# Patient Record
Sex: Female | Born: 1985 | Race: White | Hispanic: No | Marital: Single
Health system: Northeastern US, Academic
[De-identification: ages and names within clinical notes are randomized; demographics above are authoritative.]

## PROBLEM LIST (undated history)

## (undated) DIAGNOSIS — R3915 Urgency of urination: Secondary | ICD-10-CM

## (undated) DIAGNOSIS — R011 Cardiac murmur, unspecified: Secondary | ICD-10-CM

## (undated) DIAGNOSIS — N200 Calculus of kidney: Secondary | ICD-10-CM

## (undated) DIAGNOSIS — F32A Depression, unspecified: Secondary | ICD-10-CM

## (undated) DIAGNOSIS — F419 Anxiety disorder, unspecified: Secondary | ICD-10-CM

## (undated) DIAGNOSIS — D689 Coagulation defect, unspecified: Secondary | ICD-10-CM

## (undated) DIAGNOSIS — R350 Frequency of micturition: Secondary | ICD-10-CM

## (undated) DIAGNOSIS — K219 Gastro-esophageal reflux disease without esophagitis: Secondary | ICD-10-CM

## (undated) DIAGNOSIS — I10 Essential (primary) hypertension: Secondary | ICD-10-CM

## (undated) DIAGNOSIS — Z86718 Personal history of other venous thrombosis and embolism: Secondary | ICD-10-CM

## (undated) DIAGNOSIS — Z8719 Personal history of other diseases of the digestive system: Secondary | ICD-10-CM

## (undated) DIAGNOSIS — I639 Cerebral infarction, unspecified: Secondary | ICD-10-CM

## (undated) DIAGNOSIS — E282 Polycystic ovarian syndrome: Secondary | ICD-10-CM

## (undated) DIAGNOSIS — Z973 Presence of spectacles and contact lenses: Secondary | ICD-10-CM

## (undated) HISTORY — DX: Depression, unspecified: F32.A

## (undated) HISTORY — DX: Cardiac murmur, unspecified: R01.1

## (undated) HISTORY — DX: Personal history of other venous thrombosis and embolism: Z86.718

## (undated) HISTORY — DX: Essential (primary) hypertension: I10

## (undated) HISTORY — DX: Anxiety disorder, unspecified: F41.9

## (undated) HISTORY — DX: Cerebral infarction, unspecified: I63.9

## (undated) HISTORY — DX: Coagulation defect, unspecified: D68.9

---

## 2012-01-08 HISTORY — PX: TONSILLECTOMY: SUR1361

## 2013-01-07 HISTORY — PX: ESOPHAGOGASTRODUODENOSCOPY: SHX1529

## 2016-01-08 HISTORY — PX: WISDOM TOOTH EXTRACTION: SHX21

## 2018-03-12 ENCOUNTER — Emergency Department (HOSPITAL_COMMUNITY): Payer: Managed Care, Other (non HMO)

## 2018-03-12 ENCOUNTER — Emergency Department (HOSPITAL_COMMUNITY)
Admission: EM | Admit: 2018-03-12 | Discharge: 2018-03-12 | Disposition: A | Discharge status: Home / Self Care | Payer: Managed Care, Other (non HMO) | Attending: Emergency Medicine | Admitting: Emergency Medicine

## 2018-03-12 ENCOUNTER — Other Ambulatory Visit: Payer: Self-pay

## 2018-03-12 DIAGNOSIS — Z79899 Other long term (current) drug therapy: Secondary | ICD-10-CM | POA: Insufficient documentation

## 2018-03-12 DIAGNOSIS — R1031 Right lower quadrant pain: Secondary | ICD-10-CM

## 2018-03-12 LAB — CBC
HCT: 39.5 % (ref 36.0–46.0)
Hemoglobin: 13.3 g/dL (ref 12.0–15.0)
MCH: 28.1 pg (ref 26.0–34.0)
MCHC: 33.7 g/dL (ref 30.0–36.0)
MCV: 83.5 fL (ref 80.0–100.0)
Platelets: 255 10*3/uL (ref 150–400)
RBC: 4.73 MIL/uL (ref 3.87–5.11)
RDW: 13.5 % (ref 11.5–15.5)
WBC: 12.4 10*3/uL — AB (ref 4.0–10.5)
nRBC: 0 % (ref 0.0–0.2)

## 2018-03-12 LAB — COMPREHENSIVE METABOLIC PANEL
ALT: 23 U/L (ref 0–44)
AST: 20 U/L (ref 15–41)
Albumin: 4.2 g/dL (ref 3.5–5.0)
Alkaline Phosphatase: 87 U/L (ref 38–126)
Anion gap: 10 (ref 5–15)
BILIRUBIN TOTAL: 0.7 mg/dL (ref 0.3–1.2)
BUN: 14 mg/dL (ref 6–20)
CHLORIDE: 104 mmol/L (ref 98–111)
CO2: 24 mmol/L (ref 22–32)
Calcium: 9.3 mg/dL (ref 8.9–10.3)
Creatinine, Ser: 0.81 mg/dL (ref 0.44–1.00)
GFR calc Af Amer: >60 mL/min (ref >60)
Glucose, Bld: 100 mg/dL — ABNORMAL HIGH (ref 70–99)
POTASSIUM: 3.7 mmol/L (ref 3.5–5.1)
Sodium: 138 mmol/L (ref 135–145)
Total Protein: 7.2 g/dL (ref 6.5–8.1)

## 2018-03-12 LAB — URINALYSIS, ROUTINE W REFLEX MICROSCOPIC
Bilirubin Urine: NEGATIVE
Glucose, UA: NEGATIVE mg/dL
Ketones, ur: NEGATIVE mg/dL
Leukocytes,Ua: NEGATIVE
Nitrite: NEGATIVE
Protein, ur: 30 mg/dL — AB
RBC / HPF: >50 RBC/hpf — ABNORMAL HIGH (ref 0–5)
Specific Gravity, Urine: 1.032 — ABNORMAL HIGH (ref 1.005–1.030)
pH: 5 (ref 5.0–8.0)

## 2018-03-12 LAB — WET PREP, GENITAL
Clue Cells Wet Prep HPF POC: NONE SEEN
Sperm: NONE SEEN
TRICH WET PREP: NONE SEEN
YEAST WET PREP: NONE SEEN

## 2018-03-12 LAB — HCG, QUANTITATIVE, PREGNANCY: hCG, Beta Chain, Quant, S: <1 m[IU]/mL (ref <5)

## 2018-03-12 LAB — LIPASE, BLOOD: Lipase: 49 U/L (ref 11–51)

## 2018-03-12 LAB — I-STAT BETA HCG BLOOD, ED (MC, WL, AP ONLY): HCG, QUANTITATIVE: 10.7 m[IU]/mL — AB (ref <5)

## 2018-03-12 MED ORDER — HYDROCODONE-ACETAMINOPHEN 5-325 MG PO TABS
1.0000 | ORAL_TABLET | Freq: Once | ORAL | Status: AC
  Administered 2018-03-12: 1 via ORAL
  Filled 2018-03-12: qty 1

## 2018-03-12 MED ORDER — HYDROCODONE-ACETAMINOPHEN 5-325 MG PO TABS
2.0000 | ORAL_TABLET | Freq: Once | ORAL | Status: AC
  Administered 2018-03-12: 2 via ORAL
  Filled 2018-03-12: qty 2

## 2018-03-12 MED ORDER — SODIUM CHLORIDE 0.9% FLUSH: 3.0000 mL | Freq: Once | INTRAVENOUS | Status: DC

## 2018-03-12 MED ORDER — HYDROCODONE-ACETAMINOPHEN 5-325 MG PO TABS: 1.0000 | ORAL_TABLET | Freq: Four times a day (QID) | ORAL | 0 refills | Status: DC | PRN

## 2018-03-12 NOTE — ED Notes (Signed)
Patient verbalizes understanding of discharge instructions. Opportunity for questioning and answers were provided. Armband removed by staff, pt discharged from ED. Pt ambulatory to lobby and taken home by family. Prescriptions and follow up care reviewed.  

## 2018-03-12 NOTE — ED Triage Notes (Signed)
Main lab called and will add serum quant to light green tube  In lab

## 2018-03-12 NOTE — ED Notes (Signed)
Patient transported to Ultrasound 

## 2018-03-12 NOTE — ED Provider Notes (Signed)
Care assumed from  Tilden Fossa, MD at shift change with U/S pending.   In brief, this patient is a 33 y.o.  F with past medical history of PCOS, pancreatitis who presents for evaluation of abdominal pain.  She reports that for the last 2 to 3 months, he has had intermittent right lower quadrant abdominal pain.  She reports is worsened over the last week.  She states that she has had some nausea but denies any vomiting.  She has not had any fevers, vaginal discharge, urinary complaints.  See note from previous provider for full history/physical exam.  PLAN: Patient with reassuring lab work.  She does have a slight leukocytosis.  Her i-STAT beta was slightly elevated.  Patient had her P approxi-a week ago.  We will plan for serum quant.  He has pending ultrasound for evaluation of ovarian cyst.  Pelvic was unremarkable.  MDM:  Pelvic ultrasound reviewed.  No evidence of torsion.  No suspicious mass.  There is enlarged ovaries with increased number of follicles concerning for polycystic ovarian syndrome.  Patient with normal uterus.  Discussed results with patient.  She states that she has been feeling better.  She does have a little bit of pain still.  Her abdominal examination mild tenderness noted to the right side but otherwise no rigidity, guarding.  Exam otherwise unremarkable.  I discussed at length with patient regarding her symptoms and duration of symptoms as well as her work-up here in the ED.  Her lipase was unremarkable.  Additionally, do not suspect pancreatitis given her source and location of pain.  I did discuss with patient she has a slight leukocytosis.  Patient has not had any fever, nausea/vomiting and her pain has been ongoing for last 2-3 months.  My suspicion for appendicitis is very low given history/physical exam.  I discussed with patient regarding further evaluation of her pain.  Engage shared decision making regarding further evaluation by CT abdomen pelvis.  Patient feels  comfortable going home and managing her symptoms with her OB/GYN.  She she wishes to decline CT abdomen pelvis here in the ED today.  Given exam, duration of symptoms, I feel that this is reasonable.  Instructed patient to follow-up with her primary care doctor and OB/GYN.  Discussed with patient regarding strict return precautions. At this time, patient exhibits no emergent life-threatening condition that require further evaluation in ED or admission. Patient had ample opportunity for questions and discussion. All patient's questions were answered with full understanding. Strict return precautions discussed. Patient expresses understanding and agreement to plan.   Portions of this note were generated with Scientist, clinical (histocompatibility and immunogenetics). Dictation errors may occur despite best attempts at proofreading.     1. Right lower quadrant abdominal pain         Kristen Hoover 03/12/18 2325    Tilden Fossa, MD 03/13/18 1046

## 2018-03-12 NOTE — ED Provider Notes (Signed)
[facility] EMERGENCY DEPARTMENT Provider Note   CSN: [id number] Arrival date & time: 03/12/18  1654    History   Chief Complaint Chief Complaint  Patient presents with  . Abdominal Pain    HPI Kristen Hoover is a 33 y.o. female.     The history is provided by the patient. No language interpreter was used.  Abdominal Pain   Kristen Hoover is a 33 y.o. female who presents to the Emergency Department complaining of abdominal pain. He department accompanied by her husband for evaluation of right lower quadrant pain. Pain has been present for the last 2 to 3 months. Initially it was mild and intermittent in nature. Over the last two weeks her pain has significantly intensified. Pain continues to wax and wane but is more severe when it returns. She denies any fevers, nausea, vomiting, dysuria, vaginal discharge, diarrhea, constipation. She does have a history of PC OS and has irregular menstrual cycles. She is sexually active with her husband, who has a history of vasectomy. She is not using any contraceptives. She also has a history of pancreatitis status post stent placement. No past medical history on file.  There are no active problems to display for this patient.      OB History   No obstetric history on file.      Home Medications    Prior to Admission medications   Medication Sig Start Date End Date Taking? Authorizing Provider  DM-Doxylamine-Acetaminophen (NYQUIL COLD & FLU PO) Take 15 mLs by mouth as needed (cough and cold).   Yes [provider]  FLUoxetine (PROZAC) 20 MG capsule Take 20 mg by mouth at bedtime. 02/20/18  Yes [provider]  metFORMIN (GLUCOPHAGE) 500 MG tablet Take 2 tablets by mouth 2 (two) times daily.   Yes [provider]  Naproxen Sodium 220 MG CAPS Take 2 tablets by mouth 2 (two) times daily as needed for pain.   Yes [provider]  Pseudoephedrine-APAP-DM (DAYQUIL PO) Take 2  capsules by mouth as needed (cough and cold).   Yes [provider]    Family History No family history on file.  Social History Social History   Tobacco Use  . Smoking status: Not on file  Substance Use Topics  . Alcohol use: Not on file  . Drug use: Not on file     Allergies   Patient has no allergy information on record.   Review of Systems Review of Systems  Gastrointestinal: Positive for abdominal pain.  All other systems reviewed and are negative.    Physical Exam Updated Vital Signs BP 109/74 (BP Location: Right Arm)   Pulse 88   Temp 98.2 F (36.8 C) (Oral)   Resp 18   SpO2 98%   Physical Exam Vitals signs and nursing note reviewed.  Constitutional:      Appearance: She is well-developed.  HENT:     Head: Normocephalic and atraumatic.  Cardiovascular:     Rate and Rhythm: Normal rate and regular rhythm.  Pulmonary:     Effort: Pulmonary effort is normal. No respiratory distress.  Abdominal:     Palpations: Abdomen is soft.     Tenderness: There is no guarding or rebound.     Comments: Mild right lower quadrant tenderness.  Genitourinary:    Comments: Scant vaginal bleeding.  No discharge.  No adnexal mass.  No CMT.   Musculoskeletal:        General: No tenderness.  Skin:  General: Skin is warm and dry.  Neurological:     Mental Status: She is alert and oriented to person, place, and time.  Psychiatric:        Behavior: Behavior normal.      ED Treatments / Results  Labs (all labs ordered are listed, but only abnormal results are displayed) Labs Reviewed  WET PREP, GENITAL - Abnormal; Notable for the following components:      Result Value   WBC, Wet Prep HPF POC FEW (*)    All other components within normal limits  COMPREHENSIVE METABOLIC PANEL - Abnormal; Notable for the following components:   Glucose, Bld 100 (*)    All other components within normal limits  CBC - Abnormal; Notable for the following components:   WBC  12.4 (*)    All other components within normal limits  URINALYSIS, ROUTINE W REFLEX MICROSCOPIC - Abnormal; Notable for the following components:   APPearance HAZY (*)    Specific Gravity, Urine 1.032 (*)    Hgb urine dipstick LARGE (*)    Protein, ur 30 (*)    RBC / HPF >50 (*)    Bacteria, UA RARE (*)    All other components within normal limits  I-STAT BETA HCG BLOOD, ED ([facility], [facility], [facility] ONLY) - Abnormal; Notable for the following components:   I-stat hCG, quantitative 10.7 (*)    All other components within normal limits  LIPASE, BLOOD  HCG, QUANTITATIVE, PREGNANCY  GC/CHLAMYDIA PROBE AMP ([place]) NOT AT [facility]    EKG None  Radiology No results found.  Procedures Procedures (including critical care time)  Medications Ordered in ED Medications  sodium chloride flush (NS) 0.9 % injection 3 mL (has no administration in time range)  HYDROcodone-acetaminophen (NORCO/VICODIN) 5-325 MG per tablet 1 tablet (1 tablet Oral Given 03/12/18 1846)     Initial Impression / Assessment and Plan / ED Course  I have reviewed the triage vital signs and the nursing notes.  Pertinent labs & imaging results that were available during my care of the patient were reviewed by me and considered in my medical decision making (see chart for details).        Patient with history of PCOS here for evaluation of progressive right lower quadrant pain. She has minimal tenderness on examination without peritoneal findings. Plan to obtain pelvic ultrasound to rule out ovarian cyst or torsion. Patient care transferred pending pelvic ultrasound.  Final Clinical Impressions(s) / ED Diagnoses   Final diagnoses:  None    ED Discharge Orders    None       Tilden Fossa, MD 03/12/18 [phone]

## 2018-03-12 NOTE — ED Triage Notes (Signed)
Pt here from UC with c/o rlq abd pain times 2 weeks , but has been ongoing for 2 months ,pt thinks she may have a cyst

## 2018-03-12 NOTE — Discharge Instructions (Signed)
You can take Tylenol or Ibuprofen as directed for pain. You can alternate Tylenol and Ibuprofen every 4 hours. If you take Tylenol at 1pm, then you can take Ibuprofen at 5pm. Then you can take Tylenol again at 9pm.   Take pain medications as directed for break through pain. Do not drive or operate machinery while taking this medication.   Follow-up with your OB/GYN and primary care doctor.  Return to emergency department for any worsening abdominal pain, fever, vomiting, vaginal bleeding or any other worsening or concerning symptoms.

## 2018-03-13 LAB — GC/CHLAMYDIA PROBE AMP (~~LOC~~) NOT AT ARMC
Chlamydia: NEGATIVE
Neisseria Gonorrhea: NEGATIVE

## 2018-03-20 ENCOUNTER — Ambulatory Visit
Admission: RE | Admit: 2018-03-20 | Discharge: 2018-03-20 | Disposition: A | Discharge status: Home / Self Care | Payer: Managed Care, Other (non HMO) | Source: Ambulatory Visit | Attending: Surgery | Admitting: Surgery

## 2018-03-20 ENCOUNTER — Other Ambulatory Visit: Payer: Self-pay | Admitting: Surgery

## 2018-03-20 DIAGNOSIS — R1031 Right lower quadrant pain: Secondary | ICD-10-CM

## 2018-03-20 MED ORDER — IOPAMIDOL (ISOVUE-300) INJECTION 61%
100.0000 mL | Freq: Once | INTRAVENOUS | Status: AC | PRN
  Administered 2018-03-20: 100 mL via INTRAVENOUS

## 2018-08-19 ENCOUNTER — Other Ambulatory Visit: Payer: Self-pay

## 2018-08-19 DIAGNOSIS — Z20822 Contact with and (suspected) exposure to covid-19: Secondary | ICD-10-CM

## 2018-08-20 LAB — NOVEL CORONAVIRUS, NAA: SARS-CoV-2, NAA: NOT DETECTED

## 2018-09-15 ENCOUNTER — Other Ambulatory Visit: Payer: Self-pay

## 2018-09-15 DIAGNOSIS — Z20822 Contact with and (suspected) exposure to covid-19: Secondary | ICD-10-CM

## 2018-09-16 LAB — NOVEL CORONAVIRUS, NAA: SARS-CoV-2, NAA: NOT DETECTED

## 2018-10-13 ENCOUNTER — Other Ambulatory Visit: Payer: Self-pay

## 2018-10-13 DIAGNOSIS — Z20822 Contact with and (suspected) exposure to covid-19: Secondary | ICD-10-CM

## 2018-10-15 LAB — NOVEL CORONAVIRUS, NAA: SARS-CoV-2, NAA: NOT DETECTED

## 2018-10-15 LAB — SPECIMEN STATUS REPORT

## 2018-11-21 ENCOUNTER — Other Ambulatory Visit: Payer: Self-pay | Admitting: *Deleted

## 2018-11-21 DIAGNOSIS — Z20828 Contact with and (suspected) exposure to other viral communicable diseases: Secondary | ICD-10-CM

## 2018-11-21 DIAGNOSIS — Z20822 Contact with and (suspected) exposure to covid-19: Secondary | ICD-10-CM

## 2018-11-24 LAB — NOVEL CORONAVIRUS, NAA: SARS-CoV-2, NAA: NOT DETECTED

## 2018-11-25 ENCOUNTER — Emergency Department (HOSPITAL_COMMUNITY): Payer: 59

## 2018-11-25 ENCOUNTER — Other Ambulatory Visit: Payer: Self-pay

## 2018-11-25 ENCOUNTER — Emergency Department (HOSPITAL_COMMUNITY)
Admission: EM | Admit: 2018-11-25 | Discharge: 2018-11-25 | Disposition: A | Discharge status: Home / Self Care | Payer: 59 | Attending: Emergency Medicine | Admitting: Emergency Medicine

## 2018-11-25 ENCOUNTER — Encounter (HOSPITAL_COMMUNITY): Payer: Self-pay

## 2018-11-25 DIAGNOSIS — Z79899 Other long term (current) drug therapy: Secondary | ICD-10-CM | POA: Diagnosis not present

## 2018-11-25 DIAGNOSIS — N200 Calculus of kidney: Secondary | ICD-10-CM | POA: Insufficient documentation

## 2018-11-25 DIAGNOSIS — E282 Polycystic ovarian syndrome: Secondary | ICD-10-CM | POA: Diagnosis not present

## 2018-11-25 DIAGNOSIS — R319 Hematuria, unspecified: Secondary | ICD-10-CM | POA: Diagnosis present

## 2018-11-25 DIAGNOSIS — Z7984 Long term (current) use of oral hypoglycemic drugs: Secondary | ICD-10-CM | POA: Diagnosis not present

## 2018-11-25 DIAGNOSIS — Z87891 Personal history of nicotine dependence: Secondary | ICD-10-CM | POA: Diagnosis not present

## 2018-11-25 HISTORY — DX: Polycystic ovarian syndrome: E28.2

## 2018-11-25 LAB — URINALYSIS, ROUTINE W REFLEX MICROSCOPIC
Bilirubin Urine: NEGATIVE
Glucose, UA: NEGATIVE mg/dL
Ketones, ur: NEGATIVE mg/dL
Leukocytes,Ua: NEGATIVE
Nitrite: NEGATIVE
Protein, ur: 30 mg/dL — AB
RBC / HPF: >50 RBC/hpf — ABNORMAL HIGH (ref 0–5)
Specific Gravity, Urine: 1.021 (ref 1.005–1.030)
pH: 6 (ref 5.0–8.0)

## 2018-11-25 LAB — CBG MONITORING, ED: Glucose-Capillary: 83 mg/dL (ref 70–99)

## 2018-11-25 LAB — I-STAT BETA HCG BLOOD, ED (MC, WL, AP ONLY): I-stat hCG, quantitative: <5 m[IU]/mL (ref <5)

## 2018-11-25 MED ORDER — HYDROCODONE-ACETAMINOPHEN 5-325 MG PO TABS: 1.0000 | ORAL_TABLET | Freq: Four times a day (QID) | ORAL | 0 refills | Status: AC | PRN

## 2018-11-25 MED ORDER — MORPHINE SULFATE (PF) 4 MG/ML IV SOLN
4.0000 mg | Freq: Once | INTRAVENOUS | Status: AC
  Administered 2018-11-25: 4 mg via INTRAMUSCULAR

## 2018-11-25 MED ORDER — MORPHINE SULFATE (PF) 4 MG/ML IV SOLN
4.0000 mg | Freq: Once | INTRAVENOUS | Status: DC
  Filled 2018-11-25: qty 1

## 2018-11-25 MED ORDER — ONDANSETRON HCL 4 MG PO TABS: 4.0000 mg | ORAL_TABLET | Freq: Three times a day (TID) | ORAL | 0 refills | Status: DC | PRN

## 2018-11-25 NOTE — ED Provider Notes (Signed)
[facility] Provider Note   CSN: [id number] Arrival date & time: 11/25/18  1422     History   Chief Complaint Chief Complaint  Patient presents with  . Hematuria    HPI Kristen Hoover is a 33 y.o. female w PMHx PCOS, presenting to the ED with complaint of hematuria that began this morning. She reports her urine is pink-tinged since her second time urinating this morning.  She states she has been going somewhat more frequently though she has increased her water intake.  She states she has had chronic coming and going right lower quadrant pain since March of this year.  She was evaluated at symptom onset with pelvic ultrasound consistent with PCOS and a CT scan negative for appendicitis.  She states her symptoms have not changed regarding her intermittent right lower quadrant abdominal pain since that time, though wonders if this may be associated.  She states her pain does feel worse the week prior to the onset of her menstrual period.  She has not followed up with her gynecologist since March due to Covid.  She states over the last week she has developed some nausea and sweats.  She denies assoc flank pain, dysuria, fever, urgency, vaginal d/c.      The history is provided by the patient and medical records.    Past Medical History:  Diagnosis Date  . Pancreatitis   . PCOS (polycystic ovarian syndrome)     There are no active problems to display for this patient.   Past Surgical History:  Procedure Laterality Date  . TONSILLECTOMY    . WISDOM TOOTH EXTRACTION       OB History   No obstetric history on file.      Home Medications    Prior to Admission medications   Medication Sig Start Date End Date Taking? Authorizing Provider  DM-Doxylamine-Acetaminophen (NYQUIL COLD & FLU PO) Take 15 mLs by mouth as needed (cough and cold).    [provider]  FLUoxetine (PROZAC) 20 MG capsule Take 20 mg by mouth at bedtime.  02/20/18   [provider]  HYDROcodone-acetaminophen (NORCO/VICODIN) 5-325 MG tablet Take 1-2 tablets by mouth every 6 (six) hours as needed. 03/12/18   Maxwell CaulLayden, Lindsey A, PA-C  metFORMIN (GLUCOPHAGE) 500 MG tablet Take 2 tablets by mouth 2 (two) times daily.    [provider]  Naproxen Sodium 220 MG CAPS Take 2 tablets by mouth 2 (two) times daily as needed for pain.    [provider]  Pseudoephedrine-APAP-DM (DAYQUIL PO) Take 2 capsules by mouth as needed (cough and cold).    [provider]    Family History No family history on file.  Social History Social History   Tobacco Use  . Smoking status: Former Games developermoker  . Smokeless tobacco: Never Used  Substance Use Topics  . Alcohol use: Yes    Comment: less than once a week   . Drug use: Yes    Types: Marijuana     Allergies   Patient has no known allergies.   Review of Systems Review of Systems  All other systems reviewed and are negative.    Physical Exam Updated Vital Signs BP (!) 125/93 (BP Location: Left Arm)   Pulse 97   Temp 98.1 F (36.7 C) (Oral)   Resp 18   Ht 5\' 6"  (1.676 m)   Wt 95.3 kg   SpO2 100%   BMI 33.89 kg/m   Physical Exam  Vitals signs and nursing note reviewed.  Constitutional:      General: She is not in acute distress.    Appearance: She is well-developed. She is not ill-appearing.  HENT:     Head: Normocephalic and atraumatic.  Eyes:     Conjunctiva/sclera: Conjunctivae normal.  Cardiovascular:     Rate and Rhythm: Normal rate and regular rhythm.  Pulmonary:     Effort: Pulmonary effort is normal. No respiratory distress.     Breath sounds: Normal breath sounds.  Abdominal:     General: Bowel sounds are normal.     Palpations: Abdomen is soft.     Tenderness: There is no abdominal tenderness. There is no right CVA tenderness, left CVA tenderness, guarding or rebound.  Skin:    General: Skin is warm.  Neurological:     Mental Status: She is  alert.  Psychiatric:        Behavior: Behavior normal.      ED Treatments / Results  Labs (all labs ordered are listed, but only abnormal results are displayed) Labs Reviewed  URINALYSIS, ROUTINE W REFLEX MICROSCOPIC - Abnormal; Notable for the following components:      Result Value   APPearance CLOUDY (*)    Hgb urine dipstick LARGE (*)    Protein, ur 30 (*)    RBC / HPF >50 (*)    Bacteria, UA FEW (*)    All other components within normal limits  URINE CULTURE  I-STAT BETA HCG BLOOD, ED ([facility], [facility], [facility] ONLY)  CBG MONITORING, ED    EKG None  Radiology No results found.  Procedures Procedures (including critical care time)  Medications Ordered in ED Medications - No data to display   Initial Impression / Assessment and Plan / ED Course  I have reviewed the triage vital signs and the nursing notes.  Pertinent labs & imaging results that were available during my care of the patient were reviewed by me and considered in my medical decision making (see chart for details).       Patient with chronic right lower quadrant abdominal pain, presenting to the emergency department with complaint of new onset of gross hematuria that began this morning described as pink-tinged urine.  She also began having night sweats and nausea.  No associated dysuria, vomiting, fever.  She states her right lower quadrant abdominal pain has not changed and tends to be worse the week before her menstrual period.  However, the systemic symptoms are new.  Per chart review, patient had negative pelvic ultrasound done back in March when symptoms began which was consistent with PCOS.  This is not a new diagnosis for her.  She also had CT imaging of her abdomen and pelvis done in March which revealed a small stone in the right kidney.  Evaluation today reveals very mild right lower quadrant abdominal tenderness, no peritoneal signs.  She is well-appearing and in no distress, not requiring any medication  management in the ED.  Had shared decision making with patient regarding new onset hematuria.  Her UA appears unchanged from the last in March though is consistent with blood.  Given patient's CT scan in March showing small right stone, will proceed with CT stone study to evaluate for right ureteral stone as cause of patient's nausea and hematuria today.  Care assumed at shift change by PA [place], pending CT stone study.  If negative for acute/surgical pathology, anticipate discharge with outpatient follow-up with her GYN.  UA is not consistent  with UTI and patient is not having any other associated urinary symptoms.   Final Clinical Impressions(s) / ED Diagnoses   Final diagnoses:  None    ED Discharge Orders    None       Nicollette Wilhelmi, [place] N, PA-C 11/25/18 1900    Isla Pence, MD 11/25/18 1909

## 2018-11-25 NOTE — ED Provider Notes (Signed)
Accepted handoff at shift change from Kristen Robinson PA-C. Please see prior provider note for more detail.   S: 33 y.o. female presenting for intermittent right lower quadrant abdominal pain since March.  However the last few days she has developed hematuria, sweats and nausea.  Patient with similar presentation in March where she had negative CT and ultrasound.  CT did show intrarenal nephrolithiasis at that time.  PE/labs: Renal stone pending  DDX: concern for concern for kidney stone.  Plan: CT pending for stone.  Follow-up with gynecology if CT is negative.    Physical Exam  BP (!) 125/93 (BP Location: Left Arm)   Pulse 97   Temp 98.1 F (36.7 C) (Oral)   Resp 18   Ht 5\' 6"  (1.676 m)   Wt 95.3 kg   SpO2 100%   BMI 33.89 kg/m   CONSTITUTIONAL:  well-appearing, NAD NEURO:  Alert and oriented x 3, no focal deficits EYES:  pupils equal and reactive ENT/NECK:  trachea midline, no JVD CARDIO:  reg rate, reg rhythm, well-perfused PULM:  None labored breathing GI/GU:  Abdomen non-distended no abdominal tenderness to palpation no CVA tenderness. MSK/SPINE:  No gross deformities, no edema SKIN:  no rash, atraumatic PSYCH:  Appropriate speech and behavior   ED Course/Procedures     Procedures  MDM   Patient given 1 dose of morphine for pain IM.  CT positive for kidney stone which appears to have moved into ureter since last CT.  Skin exam is 6x8 mm will give patient pain medication for home and recommend follow-up with urology.  Also given Zofran.  UA does not look acutely infected.  Urine culture pending.  If positive patient will be called with results antibiotic prescribed.  Patient is afebrile.  Patient made aware of results he is able to follow-up with urology.  Feels well after morphine.  Is awaiting pickup by family member.  Vitals within normal limits besides mild hypertension.       Kristen Hoover [place], [place] 11/26/18 0001    Kristen Rasmussen, MD 11/26/18  [phone]

## 2018-11-25 NOTE — ED Triage Notes (Signed)
Pt reports having right lower abdominal pain since march. Pt was seen and they ruled out everything but pt states the pain never went away. Pt reports that last night she started having blood in her urine, night sweat and nausea. Pt states pain is not associated with urination and denies any pain or burning sensation.

## 2018-11-25 NOTE — Discharge Instructions (Addendum)
Your kidney stone appears to have moved into a position where it is causing blood in your urine.  This may also be causing some pain.  Please follow-up with urology for reevaluation.  I have prescribed you hydrocodone for pain.  Please use only as needed for pain is not well controlled with ibuprofen.  Please use ibuprofen 600 mg every 6 hours for pain.  Use strain your urine with the provided strainer.  Please follow-up with urology.

## 2018-11-25 NOTE — ED Notes (Signed)
Admin morphine w/ kim rn as witness. Scanner broken

## 2018-11-26 LAB — URINE CULTURE: Culture: <10000 — AB

## 2018-12-12 ENCOUNTER — Other Ambulatory Visit: Payer: Self-pay

## 2018-12-12 DIAGNOSIS — Z20822 Contact with and (suspected) exposure to covid-19: Secondary | ICD-10-CM

## 2018-12-15 ENCOUNTER — Other Ambulatory Visit: Payer: Self-pay | Admitting: Urology

## 2018-12-15 LAB — NOVEL CORONAVIRUS, NAA: SARS-CoV-2, NAA: NOT DETECTED

## 2018-12-22 ENCOUNTER — Other Ambulatory Visit: Payer: Self-pay | Admitting: Urology

## 2018-12-28 ENCOUNTER — Encounter (HOSPITAL_BASED_OUTPATIENT_CLINIC_OR_DEPARTMENT_OTHER): Payer: Self-pay | Admitting: Urology

## 2018-12-28 ENCOUNTER — Other Ambulatory Visit: Payer: Self-pay

## 2018-12-28 NOTE — Progress Notes (Signed)
Spoke w/ via phone for pre-op interview--- PT Lab needs dos---- Urine preg              Lab results------ no COVID test ------ 01-02-2019 @ 1030 Arrive at ------- 0530 NPO after ------  MN Medications to take morning of surgery ----- Buspar w/ sips of water Diabetic medication ----- n/a Patient Special Instructions ----- n/a Pre-Op special Istructions ----- n/a Patient verbalized understanding of instructions that were given at this phone interview. Patient denies shortness of breath, chest pain, fever, cough a this phone interview.

## 2018-12-29 NOTE — H&P (Signed)
CC/HPI: cc: kidney stone   12/11/18: 33 year old woman with gross hematuria the that occurred 2 weeks ago and right lower quadrant pain found to have a 6 x 8 mm right UPJ calculus. Patient denies fever, chills or emesis. She does have some nausea. Today she is not in any significant pain.     ALLERGIES: NKDA    MEDICATIONS: Aleve  Buspirone Hcl 7.5 mg tablet  Fluoxetine Dr  Folic Acid  Ibuprofen  Metformin Hcl Er 750 mg tablet, extended release 24 hr     GU PSH: None     Radiation Notes: wisdom teeth   NON-GU PSH: Tonsillectomy     GU PMH: None   NON-GU PMH: Anxiety Depression    FAMILY HISTORY: Diabetes - Brother, Father heart failure - Father   SOCIAL HISTORY: Marital Status: Married Preferred Language: English; Ethnicity: Not Hispanic Or Latino; Race: White Current Smoking Status: Patient has never smoked.   Tobacco Use Assessment Completed: Used Tobacco in last 30 days? Social Drinker.  Drinks 1 caffeinated drink per day.    REVIEW OF SYSTEMS:    GU Review Female:   Patient reports frequent urination, hard to postpone urination, and get up at night to urinate. Patient denies burning /pain with urination, leakage of urine, stream starts and stops, trouble starting your stream, have to strain to urinate, and being pregnant.  Gastrointestinal (Upper):   Patient denies indigestion/ heartburn, vomiting, and nausea.  Gastrointestinal (Lower):   Patient denies diarrhea and constipation.  Constitutional:   Patient denies fever, night sweats, weight loss, and fatigue.  Skin:   Patient denies skin rash/ lesion and itching.  Eyes:   Patient denies blurred vision and double vision.  Ears/ Nose/ Throat:   Patient denies sore throat and sinus problems.  Hematologic/Lymphatic:   Patient denies swollen glands and easy bruising.  Cardiovascular:   Patient denies leg swelling and chest pains.  Respiratory:   Patient denies cough and shortness of breath.  Endocrine:   Patient  denies excessive thirst.  Musculoskeletal:   Patient denies back pain and joint pain.  Neurological:   Patient denies headaches and dizziness.  Psychologic:   Patient denies depression and anxiety.   VITAL SIGNS:      12/11/2018 09:36 AM  Weight 208 lb / 94.35 kg  Height 66 in / 167.64 cm  BP 104/69 mmHg  Pulse 84 /min  BMI 33.6 kg/m   MULTI-SYSTEM PHYSICAL EXAMINATION:    Constitutional: Well-nourished. No physical deformities. Normally developed. Good grooming.  Neck: Neck symmetrical, not swollen. Normal tracheal position.  Respiratory: No labored breathing, no use of accessory muscles.   Cardiovascular: Normal temperature, normal extremity pulses, no swelling, no varicosities.  Skin: No paleness, no jaundice, no cyanosis. No lesion, no ulcer, no rash.  Neurologic / Psychiatric: Oriented to time, oriented to place, oriented to person. No depression, no anxiety, no agitation.  Gastrointestinal: No mass, no tenderness, no rigidity, non obese abdomen. No CVA tenderness bilaterally.  Eyes: Normal conjunctivae. Normal eyelids.  Ears, Nose, Mouth, and Throat: Left ear no scars, no lesions, no masses. Right ear no scars, no lesions, no masses. Nose no scars, no lesions, no masses. Normal hearing. Normal lips.  Musculoskeletal: Normal gait and station of head and neck.     PAST DATA REVIEWED:  Source Of History:  Patient  X-Ray Review: C.T. Abdomen/Pelvis: Reviewed Films. Discussed With Patient. I reviewed patient's CT of abdomen and pelvis which showed a 6 x 8 mm right UPJ calculus.  PROCEDURES:          Urinalysis w/Scope Dipstick Dipstick Cont'd Micro  Color: Yellow Bilirubin: Neg mg/dL WBC/hpf: 6 - 10/hpf  Appearance: Slightly Cloudy Ketones: Neg mg/dL RBC/hpf: 3 - 10/hpf  Specific Gravity: 1.015 Blood: Trace ery/uL Bacteria: Many (>50/hpf)  pH: 7.5 Protein: Neg mg/dL Cystals: NS (Not Seen)  Glucose: Neg mg/dL Urobilinogen: 0.2 mg/dL Casts: NS (Not Seen)    Nitrites: Neg  Trichomonas: Not Present    Leukocyte Esterase: Trace leu/uL Mucous: Not Present      Epithelial Cells: 6 - 10/hpf      Yeast: NS (Not Seen)      Sperm: Not Present    ASSESSMENT:      ICD-10 Details  1 GU:   Ureteral calculus - N20.1 Right, Stable - Reviewed patient's CT scan and discussed options including observation, ureteroscopy and possible ESWL depending on actual position of stone. Patient is nontoxic appearing and urine appears contaminated but probably not infected. Patient would like to be set up for ureteroscopy after discussing the risks and benefits which include bleeding, pain, need for staged procedure, and later moved stone, stent discomfort, damage to surrounding structures including urethra/bladder/ureter and kidney.   PLAN:           Document Letter(s):  Created for Patient: Clinical Summary         Notes:   Schedule RIGHT URS/LL/stent for 12/29

## 2019-01-02 ENCOUNTER — Other Ambulatory Visit (HOSPITAL_COMMUNITY)
Admission: RE | Admit: 2019-01-02 | Discharge: 2019-01-02 | Disposition: A | Discharge status: Home / Self Care | Payer: 59 | Source: Ambulatory Visit | Attending: Urology | Admitting: Urology

## 2019-01-02 DIAGNOSIS — Z01812 Encounter for preprocedural laboratory examination: Secondary | ICD-10-CM | POA: Diagnosis present

## 2019-01-02 DIAGNOSIS — Z20828 Contact with and (suspected) exposure to other viral communicable diseases: Secondary | ICD-10-CM | POA: Insufficient documentation

## 2019-01-02 LAB — SARS CORONAVIRUS 2 (TAT 6-24 HRS): SARS Coronavirus 2: NEGATIVE

## 2019-01-04 NOTE — Anesthesia Preprocedure Evaluation (Addendum)
Anesthesia Evaluation  Patient identified by MRN, date of birth, ID band Patient awake    Reviewed: Allergy & Precautions, NPO status , Patient's Chart, lab work & pertinent test results  Airway Mallampati: II  TM Distance: >3 FB Neck ROM: Full    Dental no notable dental hx. (+) Teeth Intact, Dental Advisory Given   Pulmonary neg pulmonary ROS,    Pulmonary exam normal breath sounds clear to auscultation       Cardiovascular negative cardio ROS Normal cardiovascular exam Rhythm:Regular Rate:Normal     Neuro/Psych Anxiety Depression negative neurological ROS  negative psych ROS   GI/Hepatic Neg liver ROS, GERD  Medicated and Controlled,  Endo/Other  Hx pancreatitis, last episode 2006 Obesity BMI 34  Renal/GU Renal diseaseRight renal stone  Female GU complaint PCOS on metformin, urinary frequency/urgency    Musculoskeletal negative musculoskeletal ROS (+)   Abdominal Normal abdominal exam  (+)   Peds  Hematology negative hematology ROS (+)   Anesthesia Other Findings   Reproductive/Obstetrics negative OB ROS                         Anesthesia Physical Anesthesia Plan  ASA: II  Anesthesia Plan: General   Post-op Pain Management:    Induction: Intravenous  PONV Risk Score and Plan: 4 or greater and Ondansetron, Dexamethasone, Midazolam, Scopolamine patch - Pre-op and Treatment may vary due to age or medical condition  Airway Management Planned: LMA  Additional Equipment: None  Intra-op Plan:   Post-operative Plan: Extubation in OR  Informed Consent: I have reviewed the patients History and Physical, chart, labs and discussed the procedure including the risks, benefits and alternatives for the proposed anesthesia with the patient or authorized representative who has indicated his/her understanding and acceptance.     Dental advisory given  Plan Discussed with:  CRNA  Anesthesia Plan Comments:         Anesthesia Quick Evaluation

## 2019-01-05 ENCOUNTER — Other Ambulatory Visit: Payer: Self-pay

## 2019-01-05 ENCOUNTER — Ambulatory Visit (HOSPITAL_BASED_OUTPATIENT_CLINIC_OR_DEPARTMENT_OTHER): Payer: 59 | Admitting: Anesthesiology

## 2019-01-05 ENCOUNTER — Encounter (HOSPITAL_BASED_OUTPATIENT_CLINIC_OR_DEPARTMENT_OTHER)
Admission: RE | Disposition: A | Discharge status: Home / Self Care | Payer: Self-pay | Source: Home / Self Care | Attending: Urology

## 2019-01-05 ENCOUNTER — Ambulatory Visit (HOSPITAL_BASED_OUTPATIENT_CLINIC_OR_DEPARTMENT_OTHER)
Admission: RE | Admit: 2019-01-05 | Discharge: 2019-01-05 | Disposition: A | Discharge status: Home / Self Care | Payer: 59 | Attending: Urology | Admitting: Urology

## 2019-01-05 ENCOUNTER — Encounter (HOSPITAL_BASED_OUTPATIENT_CLINIC_OR_DEPARTMENT_OTHER): Payer: Self-pay | Admitting: Urology

## 2019-01-05 DIAGNOSIS — F329 Major depressive disorder, single episode, unspecified: Secondary | ICD-10-CM | POA: Insufficient documentation

## 2019-01-05 DIAGNOSIS — F419 Anxiety disorder, unspecified: Secondary | ICD-10-CM | POA: Insufficient documentation

## 2019-01-05 DIAGNOSIS — Z833 Family history of diabetes mellitus: Secondary | ICD-10-CM | POA: Diagnosis not present

## 2019-01-05 DIAGNOSIS — E669 Obesity, unspecified: Secondary | ICD-10-CM | POA: Insufficient documentation

## 2019-01-05 DIAGNOSIS — Z8249 Family history of ischemic heart disease and other diseases of the circulatory system: Secondary | ICD-10-CM | POA: Diagnosis not present

## 2019-01-05 DIAGNOSIS — Z7984 Long term (current) use of oral hypoglycemic drugs: Secondary | ICD-10-CM | POA: Insufficient documentation

## 2019-01-05 DIAGNOSIS — N202 Calculus of kidney with calculus of ureter: Secondary | ICD-10-CM | POA: Diagnosis present

## 2019-01-05 DIAGNOSIS — Z791 Long term (current) use of non-steroidal anti-inflammatories (NSAID): Secondary | ICD-10-CM | POA: Insufficient documentation

## 2019-01-05 DIAGNOSIS — N200 Calculus of kidney: Secondary | ICD-10-CM | POA: Insufficient documentation

## 2019-01-05 DIAGNOSIS — K219 Gastro-esophageal reflux disease without esophagitis: Secondary | ICD-10-CM | POA: Insufficient documentation

## 2019-01-05 DIAGNOSIS — Z79899 Other long term (current) drug therapy: Secondary | ICD-10-CM | POA: Diagnosis not present

## 2019-01-05 DIAGNOSIS — Z6833 Body mass index (BMI) 33.0-33.9, adult: Secondary | ICD-10-CM | POA: Insufficient documentation

## 2019-01-05 HISTORY — DX: Presence of spectacles and contact lenses: Z97.3

## 2019-01-05 HISTORY — DX: Personal history of other diseases of the digestive system: Z87.19

## 2019-01-05 HISTORY — DX: Urgency of urination: R39.15

## 2019-01-05 HISTORY — DX: Calculus of kidney: N20.0

## 2019-01-05 HISTORY — DX: Gastro-esophageal reflux disease without esophagitis: K21.9

## 2019-01-05 HISTORY — DX: Frequency of micturition: R35.0

## 2019-01-05 HISTORY — PX: CYSTOSCOPY WITH RETROGRADE PYELOGRAM, URETEROSCOPY AND STENT PLACEMENT: SHX5789

## 2019-01-05 LAB — POCT PREGNANCY, URINE: Preg Test, Ur: NEGATIVE

## 2019-01-05 SURGERY — CYSTOURETEROSCOPY, WITH RETROGRADE PYELOGRAM AND STENT INSERTION
Anesthesia: General | Site: Ureter | Laterality: Right

## 2019-01-05 MED ORDER — PHENYLEPHRINE HCL (PRESSORS) 10 MG/ML IV SOLN
INTRAVENOUS | Status: DC | PRN
  Administered 2019-01-05: 80 ug via INTRAVENOUS

## 2019-01-05 MED ORDER — DEXAMETHASONE SODIUM PHOSPHATE 10 MG/ML IJ SOLN
INTRAMUSCULAR | Status: DC | PRN
  Administered 2019-01-05: 5 mg via INTRAVENOUS

## 2019-01-05 MED ORDER — OXYCODONE HCL 5 MG PO TABS
ORAL_TABLET | ORAL | Status: AC
  Filled 2019-01-05: qty 1

## 2019-01-05 MED ORDER — DEXAMETHASONE SODIUM PHOSPHATE 10 MG/ML IJ SOLN
INTRAMUSCULAR | Status: AC
  Filled 2019-01-05: qty 1

## 2019-01-05 MED ORDER — ONDANSETRON HCL 4 MG/2ML IJ SOLN
INTRAMUSCULAR | Status: DC | PRN
  Administered 2019-01-05: 4 mg via INTRAVENOUS

## 2019-01-05 MED ORDER — HYDROCODONE-ACETAMINOPHEN 5-325 MG PO TABS: 1.0000 | ORAL_TABLET | Freq: Four times a day (QID) | ORAL | 0 refills | Status: DC | PRN

## 2019-01-05 MED ORDER — LIDOCAINE 2% (20 MG/ML) 5 ML SYRINGE
INTRAMUSCULAR | Status: DC | PRN
  Administered 2019-01-05: 60 mg via INTRAVENOUS

## 2019-01-05 MED ORDER — KETOROLAC TROMETHAMINE 30 MG/ML IJ SOLN
INTRAMUSCULAR | Status: AC
  Filled 2019-01-05: qty 1

## 2019-01-05 MED ORDER — HYDROMORPHONE HCL 1 MG/ML IJ SOLN
INTRAMUSCULAR | Status: AC
  Filled 2019-01-05: qty 1

## 2019-01-05 MED ORDER — SODIUM CHLORIDE 0.9 % IR SOLN
Status: DC | PRN
  Administered 2019-01-05 (×2): 3000 mL

## 2019-01-05 MED ORDER — KETOROLAC TROMETHAMINE 30 MG/ML IJ SOLN
INTRAMUSCULAR | Status: DC | PRN
  Administered 2019-01-05: 30 mg via INTRAVENOUS

## 2019-01-05 MED ORDER — SUCCINYLCHOLINE CHLORIDE 200 MG/10ML IV SOSY
PREFILLED_SYRINGE | INTRAVENOUS | Status: DC | PRN
  Administered 2019-01-05: 40 mg via INTRAVENOUS

## 2019-01-05 MED ORDER — ACETAMINOPHEN 500 MG PO TABS
1000.0000 mg | ORAL_TABLET | Freq: Once | ORAL | Status: AC
  Administered 2019-01-05: 06:00:00 1000 mg via ORAL
  Filled 2019-01-05: qty 2

## 2019-01-05 MED ORDER — LIDOCAINE 2% (20 MG/ML) 5 ML SYRINGE
INTRAMUSCULAR | Status: AC
  Filled 2019-01-05: qty 5

## 2019-01-05 MED ORDER — TAMSULOSIN HCL 0.4 MG PO CAPS: 0.4000 mg | ORAL_CAPSULE | Freq: Every day | ORAL | 0 refills | Status: DC

## 2019-01-05 MED ORDER — KETOROLAC TROMETHAMINE 30 MG/ML IJ SOLN
30.0000 mg | Freq: Once | INTRAMUSCULAR | Status: DC | PRN
  Filled 2019-01-05: qty 1

## 2019-01-05 MED ORDER — CEFAZOLIN SODIUM-DEXTROSE 2-4 GM/100ML-% IV SOLN
INTRAVENOUS | Status: AC
  Filled 2019-01-05: qty 100

## 2019-01-05 MED ORDER — OXYBUTYNIN CHLORIDE 5 MG PO TABS: 5.0000 mg | ORAL_TABLET | Freq: Three times a day (TID) | ORAL | 1 refills | Status: DC | PRN

## 2019-01-05 MED ORDER — ACETAMINOPHEN 500 MG PO TABS
ORAL_TABLET | ORAL | Status: AC
  Filled 2019-01-05: qty 2

## 2019-01-05 MED ORDER — PROMETHAZINE HCL 25 MG/ML IJ SOLN
6.2500 mg | INTRAMUSCULAR | Status: DC | PRN
  Filled 2019-01-05: qty 1

## 2019-01-05 MED ORDER — OXYCODONE HCL 5 MG/5ML PO SOLN
5.0000 mg | Freq: Once | ORAL | Status: AC | PRN
  Filled 2019-01-05: qty 5

## 2019-01-05 MED ORDER — SCOPOLAMINE 1 MG/3DAYS TD PT72
1.0000 | MEDICATED_PATCH | TRANSDERMAL | Status: DC
  Administered 2019-01-05: 06:00:00 1.5 mg via TRANSDERMAL
  Filled 2019-01-05: qty 1

## 2019-01-05 MED ORDER — OXYCODONE HCL 5 MG PO TABS
5.0000 mg | ORAL_TABLET | Freq: Once | ORAL | Status: AC | PRN
  Administered 2019-01-05: 10:00:00 5 mg via ORAL
  Filled 2019-01-05: qty 1

## 2019-01-05 MED ORDER — SODIUM CHLORIDE 0.9 % IV SOLN
INTRAVENOUS | Status: DC | PRN
  Administered 2019-01-05: 6 mL

## 2019-01-05 MED ORDER — SCOPOLAMINE 1 MG/3DAYS TD PT72
MEDICATED_PATCH | TRANSDERMAL | Status: AC
  Filled 2019-01-05: qty 1

## 2019-01-05 MED ORDER — MIDAZOLAM HCL 2 MG/2ML IJ SOLN
INTRAMUSCULAR | Status: AC
  Filled 2019-01-05: qty 2

## 2019-01-05 MED ORDER — FENTANYL CITRATE (PF) 100 MCG/2ML IJ SOLN
INTRAMUSCULAR | Status: DC | PRN
  Administered 2019-01-05 (×2): 50 ug via INTRAVENOUS

## 2019-01-05 MED ORDER — FENTANYL CITRATE (PF) 100 MCG/2ML IJ SOLN
INTRAMUSCULAR | Status: AC
  Filled 2019-01-05: qty 2

## 2019-01-05 MED ORDER — ONDANSETRON HCL 4 MG/2ML IJ SOLN
INTRAMUSCULAR | Status: AC
  Filled 2019-01-05: qty 2

## 2019-01-05 MED ORDER — SENNOSIDES-DOCUSATE SODIUM 8.6-50 MG PO TABS: 2.0000 | ORAL_TABLET | Freq: Every day | ORAL | 1 refills | Status: DC | PRN

## 2019-01-05 MED ORDER — LACTATED RINGERS IV SOLN
INTRAVENOUS | Status: DC
  Filled 2019-01-05: qty 1000

## 2019-01-05 MED ORDER — ARTIFICIAL TEARS OPHTHALMIC OINT
TOPICAL_OINTMENT | OPHTHALMIC | Status: AC
  Filled 2019-01-05: qty 3.5

## 2019-01-05 MED ORDER — CEFAZOLIN SODIUM-DEXTROSE 2-4 GM/100ML-% IV SOLN
2.0000 g | INTRAVENOUS | Status: AC
  Administered 2019-01-05: 2 g via INTRAVENOUS
  Filled 2019-01-05: qty 100

## 2019-01-05 MED ORDER — PHENAZOPYRIDINE HCL 200 MG PO TABS: 200.0000 mg | ORAL_TABLET | Freq: Three times a day (TID) | ORAL | 0 refills | Status: DC | PRN

## 2019-01-05 MED ORDER — CEPHALEXIN 500 MG PO CAPS: 500.0000 mg | ORAL_CAPSULE | Freq: Two times a day (BID) | ORAL | 0 refills | Status: AC

## 2019-01-05 MED ORDER — MEPERIDINE HCL 25 MG/ML IJ SOLN
6.2500 mg | INTRAMUSCULAR | Status: DC | PRN
  Filled 2019-01-05: qty 1

## 2019-01-05 MED ORDER — HYDROMORPHONE HCL 1 MG/ML IJ SOLN
0.2500 mg | INTRAMUSCULAR | Status: DC | PRN
  Administered 2019-01-05 (×4): 0.5 mg via INTRAVENOUS
  Filled 2019-01-05: qty 0.5

## 2019-01-05 MED ORDER — PROPOFOL 10 MG/ML IV BOLUS
INTRAVENOUS | Status: DC | PRN
  Administered 2019-01-05: 200 mg via INTRAVENOUS
  Administered 2019-01-05 (×2): 100 mg via INTRAVENOUS

## 2019-01-05 MED ORDER — PROPOFOL 500 MG/50ML IV EMUL
INTRAVENOUS | Status: AC
  Filled 2019-01-05: qty 50

## 2019-01-05 MED ORDER — MIDAZOLAM HCL 2 MG/2ML IJ SOLN
INTRAMUSCULAR | Status: DC | PRN
  Administered 2019-01-05: 2 mg via INTRAVENOUS

## 2019-01-05 SURGICAL SUPPLY — 23 items
BAG DRAIN URO-CYSTO SKYTR STRL (DRAIN) ×4 IMPLANT
BASKET ZERO TIP NITINOL 2.4FR (BASKET) IMPLANT
CATH URET 5FR 28IN OPEN ENDED (CATHETERS) ×4 IMPLANT
CLOTH BEACON ORANGE TIMEOUT ST (SAFETY) ×4 IMPLANT
DRSG TEGADERM 4X4.75 (GAUZE/BANDAGES/DRESSINGS) IMPLANT
EXTRACTOR STONE 1.7FRX115CM (UROLOGICAL SUPPLIES) IMPLANT
FIBER LASER FLEXIVA 200 (UROLOGICAL SUPPLIES) IMPLANT
GLOVE BIO SURGEON STRL SZ 6.5 (GLOVE) ×6 IMPLANT
GLOVE BIO SURGEONS STRL SZ 6.5 (GLOVE) ×2
GLOVE BIOGEL PI IND STRL 7.0 (GLOVE) ×2 IMPLANT
GLOVE BIOGEL PI INDICATOR 7.0 (GLOVE) ×2
GOWN STRL REUS W/TWL LRG LVL3 (GOWN DISPOSABLE) ×8 IMPLANT
GUIDEWIRE ANG ZIPWIRE 038X150 (WIRE) ×4 IMPLANT
GUIDEWIRE STR DUAL SENSOR (WIRE) ×4 IMPLANT
GUIDEWIRE SUPER STIFF (WIRE) ×4 IMPLANT
KIT TURNOVER CYSTO (KITS) IMPLANT
MANIFOLD NEPTUNE II (INSTRUMENTS) ×4 IMPLANT
PACK CYSTO (CUSTOM PROCEDURE TRAY) ×4 IMPLANT
STENT PERCUFLEX 4.8FRX26 (STENTS) ×4 IMPLANT
STENT URET 6FRX26 CONTOUR (STENTS) IMPLANT
TUBE CONNECTING 12'X1/4 (SUCTIONS) ×1
TUBE CONNECTING 12X1/4 (SUCTIONS) ×3 IMPLANT
TUBING UROLOGY SET (TUBING) ×4 IMPLANT

## 2019-01-05 NOTE — Anesthesia Postprocedure Evaluation (Signed)
Anesthesia Post Note  Patient: Kristen Hoover  Procedure(s) Performed: CYSTOSCOPY/URETEROSCOP/STENT PLACEMENT (Right Ureter)     Patient location during evaluation: PACU Anesthesia Type: General Level of consciousness: awake and alert, oriented and patient cooperative Pain management: pain level controlled Vital Signs Assessment: post-procedure vital signs reviewed and stable Respiratory status: spontaneous breathing, nonlabored ventilation and respiratory function stable Cardiovascular status: blood pressure returned to baseline and stable Postop Assessment: no apparent nausea or vomiting Anesthetic complications: no    Last Vitals:  Vitals:   01/05/19 0827 01/05/19 0845  BP:  122/77  Pulse:  72  Resp:  (!) 8  Temp: 36.8 C   SpO2:  99%    Last Pain:  Vitals:   01/05/19 0556  TempSrc: Oral                 Pervis Hocking

## 2019-01-05 NOTE — Anesthesia Procedure Notes (Addendum)
Procedure Name: LMA Insertion Date/Time: 01/05/2019 7:28 AM Performed by: Wanita Chamberlain, CRNA Pre-anesthesia Checklist: Patient identified, Timeout performed, Emergency Drugs available, Suction available and Patient being monitored Patient Re-evaluated:Patient Re-evaluated prior to induction Oxygen Delivery Method: Circle system utilized Preoxygenation: Pre-oxygenation with 100% oxygen Induction Type: IV induction Ventilation: Mask ventilation without difficulty LMA: LMA inserted LMA Size: 4.0 Number of attempts: 1 Airway Equipment and Method: Bite block Placement Confirmation: breath sounds checked- equal and bilateral,  CO2 detector and positive ETCO2 Tube secured with: Tape Dental Injury: Teeth and Oropharynx as per pre-operative assessment

## 2019-01-05 NOTE — Interval H&P Note (Signed)
History and Physical Interval Note:  01/05/2019 7:03 AM  Kristen Hoover  has presented today for surgery, with the diagnosis of RIGHT KIDNEY STONE.  The various methods of treatment have been discussed with the patient and family. After consideration of risks, benefits and other options for treatment, the patient has consented to  Procedure(s) with comments: CYSTOSCOPY/URETEROSCOPY/HOLMIUM LASER/STENT PLACEMENT (Right) - 75 MINS as a surgical intervention.  The patient's history has been reviewed, patient examined, no change in status, stable for surgery.  I have reviewed the patient's chart and labs.  Questions were answered to the patient's satisfaction.     Ezelle Surprenant D Lashaun Krapf

## 2019-01-05 NOTE — Op Note (Signed)
PATIENT:  Kristen Hoover  PRE-OPERATIVE DIAGNOSIS:  right renal pelvis stone  POST-OPERATIVE DIAGNOSIS: Same  PROCEDURE:  1. Cystoscopy, right retrograde pyelogram, right diagnostic ureteroscopy, right ureteral stent placement  SURGEON: Jacalyn Lefevre, MD  INDICATION: 33 yo woman with 6 x 8 mm right renal pelvis stone and associated pain.  FINDINGS: 1. Narrow urethra 2. Normal bladder without mucosal abnormalities 3. Right RPG showed very narrowed ureters to renal pelvis with medial course, no filling defects in renal pelvis, slow drainage of contrast 4. 4.8Fr x 26cm right ureteral stent  ANESTHESIA:  General  EBL:  Minimal  DRAINS: 4.8Fr x 26 cm right ureteral stent  SPECIMEN:  none  DESCRIPTION OF PROCEDURE: The patient was taken to the major OR and placed on the table. General anesthesia was administered and then the patient was moved to the dorsal lithotomy position. The genitalia was sterilely prepped and draped. An official timeout was performed.  Initially the 57 French cystoscope with 30 lens was passed under direct vision into the bladder. The bladder was then fully inspected. It was noted be free of any tumors, stones or inflammatory lesions. Ureteral orifices were of normal configuration and position. A 6 French open-ended ureteral catheter was then passed through the cystoscope into the ureteral orifice in order to perform a right retrograde pyelogram.  A retrograde pyelogram was performed by injecting full-strength contrast up the right ureter under direct fluoroscopic control. It revealed narrow ureters up to the renal pelvis with ureter coursing more medial than typical.  I then passed a 0.038 inch floppy-tipped guidewire through the open ended catheter and into the area of the renal pelvis and this was left in place.   A 6 French semi-rigid ureteroscope was then passed under direct into the bladder and into the right orifice but significant resistance was met  due to caliber of ureter.  The decision was made to leave a stent.  Multiple attempts at placing a 6Fr x 26cm stent were unsuccessful.  A 4.8Fr x 26 cm stent was then easily placed over a wire with fluoroscopic guidance. A distal curl was seen in the bladder under direct visualization.  The bladder was drained and the cystoscope was then removed. The patient tolerated the procedure well no intraoperative complications.  PLAN OF CARE: Discharge to home after PACU  PATIENT DISPOSITION:  PACU - hemodynamically stable.  FOLLOW UP: Patient will need to be scheduled for staged ureteroscopy with LL and stent exchange in approximately 4 weeks.

## 2019-01-05 NOTE — Discharge Instructions (Signed)
Post Anesthesia Home Care Instructions  Activity: Get plenty of rest for the remainder of the day. A responsible adult should stay with you for 24 hours following the procedure.  For the next 24 hours, DO NOT: -Drive a car -Paediatric nurse -Drink alcoholic beverages -Take any medication unless instructed by your physician -Make any legal decisions or sign important papers.  Meals: Start with liquid foods such as gelatin or soup. Progress to regular foods as tolerated. Avoid greasy, spicy, heavy foods. If nausea and/or vomiting occur, drink only clear liquids until the nausea and/or vomiting subsides. Call your physician if vomiting continues.  Special Instructions/Symptoms: Your throat may feel dry or sore from the anesthesia or the breathing tube placed in your throat during surgery. If this causes discomfort, gargle with warm salt water. The discomfort should disappear within 24 hours.  If you had a scopolamine patch placed behind your ear for the management of post- operative nausea and/or vomiting:  1. The medication in the patch is effective for 72 hours, after which it should be removed.  Wrap patch in a tissue and discard in the trash. Wash hands thoroughly with soap and water. 2. You may remove the patch earlier than 72 hours if you experience unpleasant side effects which may include dry mouth, dizziness or visual disturbances. 3. Avoid touching the patch. Wash your hands with soap and water after contact with the patch.   Post stent placement instructions   Definitions:  Ureter: The duct that transports urine from the kidney to the bladder. Stent: A plastic hollow tube that is placed into the ureter, from the kidney to the bladder to prevent the ureter from swelling shut.  General instructions:  Despite the fact that no skin incisions were used, the area around the ureter and bladder is raw and irritated. The stent is a foreign body which can further irritate the  bladder wall. This irritation is manifested by increased frequency of urination, both day and night, and by an increase in the urge to urinate. In some, the urge to urinate is present almost always. Sometimes the urge is strong enough that you may not be able to stop your self from urinating. This can often be controlled with medication but does not occur in everyone. A stent can safely be left in place for 3 months or greater.  You may see some blood in your urine while the stent is in place and a few days afterward. Do not be alarmed, even if the urine is clear for a while. Get off your feet and drink lots of fluids until clearing occurs. If you start to pass clots or don't improve, call us.  Diet:  You may return to your normal diet immediately. Because of the raw surface of your bladder, alcohol, spicy foods, foods high in acid and drinks with caffeine may cause irritation or frequency and should be used in moderation. To keep your urine flowing freely and avoid constipation, drink plenty of fluids during the day (8-10 glasses). Tip: Avoid cranberry juice because it is very acidic.  Activity:  Your physical activity doesn't need to be restricted. However, if you are very active, you may see some blood in the urine. We suggest that you reduce your activity under the circumstances until the bleeding has stopped.  Bowels:  It is important to keep your bowels regular during the postoperative period. Straining with bowel movements can cause bleeding. A bowel movement every other day is reasonable. Use a mild laxative  if needed, such as milk of magnesia 2-3 tablespoons, or 2 Dulcolax tablets. Call if you continue to have problems. If you had been taking narcotics for pain, before, during or after your surgery, you may be constipated. Take a laxative if necessary.  Medication:  You should resume your pre-surgery medications unless told not to. In addition you may be given an antibiotic to prevent or  treat infection. Antibiotics are not always necessary. All medication should be taken as prescribed until the bottles are finished unless you are having an unusual reaction to one of the drugs.  Problems you should report to [place]:  a. Fever greater than 101F. b. Heavy bleeding, or clots (see notes above about blood in urine). c. Inability to urinate. d. Drug reactions (hives, rash, nausea, vomiting, diarrhea). e. Severe burning or pain with urination that is not improving.   Medications 1. Flomax - 1 tab daily to help with stent discomfort 2. Oxybutynin - 1 tab up to 3 times a day as needed for overactive bladder/cramping 3. Pyridium - 1 tab up to 3 times a day as needed for burning/pain with urination after procedure 4. Hydrocodone-acetaminophen - 1 tab every 6 hours as needed for postoperative pain 5. Docusate-senna - 1 tab twice a day to help prevent constipation 6. Keflex - 1 tab twice a day to help prevent infection  Follow up: you will be contacted by [facility] for you next surgery date.

## 2019-01-05 NOTE — Transfer of Care (Signed)
Immediate Anesthesia Transfer of Care Note  Patient: Kristen Hoover  Procedure(s) Performed: CYSTOSCOPY/URETEROSCOP/STENT PLACEMENT (Right Ureter)  Patient Location: PACU  Anesthesia Type:General  Level of Consciousness: awake, alert , oriented and patient cooperative  Airway & Oxygen Therapy: Patient Spontanous Breathing and Patient connected to nasal cannula oxygen  Post-op Assessment: Report given to RN and Post -op Vital signs reviewed and stable  Post vital signs: Reviewed and stable  Last Vitals:  Vitals Value Taken Time  BP    Temp    Pulse    Resp    SpO2      Last Pain:  Vitals:   01/05/19 0556  TempSrc: Oral         Complications: No apparent anesthesia complications

## 2019-01-10 ENCOUNTER — Encounter (INDEPENDENT_AMBULATORY_CARE_PROVIDER_SITE_OTHER): Payer: Self-pay

## 2019-01-11 ENCOUNTER — Encounter (INDEPENDENT_AMBULATORY_CARE_PROVIDER_SITE_OTHER): Payer: Self-pay

## 2019-01-11 ENCOUNTER — Telehealth: Payer: Self-pay

## 2019-01-11 NOTE — Telephone Encounter (Signed)
Attempted to contact patient by phone and the number was not in service. Mychart message was sent to patient to contact office is she had questions about information provided

## 2019-01-12 ENCOUNTER — Other Ambulatory Visit: Payer: Self-pay | Admitting: Urology

## 2019-01-27 ENCOUNTER — Other Ambulatory Visit: Payer: Self-pay | Admitting: Urology

## 2019-01-28 ENCOUNTER — Other Ambulatory Visit: Payer: Self-pay | Admitting: Cardiology

## 2019-01-28 DIAGNOSIS — Z20822 Contact with and (suspected) exposure to covid-19: Secondary | ICD-10-CM

## 2019-01-29 LAB — NOVEL CORONAVIRUS, NAA: SARS-CoV-2, NAA: NOT DETECTED

## 2019-02-03 ENCOUNTER — Encounter (HOSPITAL_BASED_OUTPATIENT_CLINIC_OR_DEPARTMENT_OTHER): Payer: Self-pay | Admitting: Urology

## 2019-02-03 ENCOUNTER — Other Ambulatory Visit: Payer: Self-pay

## 2019-02-03 NOTE — Progress Notes (Signed)
Spoke w/ via phone for pre-op interview--- PT Lab needs dos----  Urine preg             Lab results------ no COVID test ------ 02-05-2019 @ 1510 Arrive at ------- 0530 NPO after ------ [place] Medications to take morning of surgery ----- Buspar, Oxybutynin w/ sips of water Diabetic medication ----- n/a Patient Special Instructions ----- n/a Pre-Op special Istructions ----- n/a Patient verbalized understanding of instructions that were given at this phone interview. Patient denies shortness of breath, chest pain, fever, cough a this phone interview.

## 2019-02-05 ENCOUNTER — Other Ambulatory Visit (HOSPITAL_COMMUNITY)
Admission: RE | Admit: 2019-02-05 | Discharge: 2019-02-05 | Disposition: A | Discharge status: Home / Self Care | Payer: 59 | Source: Ambulatory Visit | Attending: Urology | Admitting: Urology

## 2019-02-05 DIAGNOSIS — Z20822 Contact with and (suspected) exposure to covid-19: Secondary | ICD-10-CM | POA: Diagnosis not present

## 2019-02-05 DIAGNOSIS — Z01812 Encounter for preprocedural laboratory examination: Secondary | ICD-10-CM | POA: Diagnosis not present

## 2019-02-05 LAB — SARS CORONAVIRUS 2 (TAT 6-24 HRS): SARS Coronavirus 2: NEGATIVE

## 2019-02-08 NOTE — Anesthesia Preprocedure Evaluation (Addendum)
Anesthesia Evaluation  Patient identified by MRN, date of birth, ID band  Reviewed: Allergy & Precautions, H&P , NPO status , Patient's Chart, lab work & pertinent test results  Airway Mallampati: II  TM Distance: >3 FB Neck ROM: Full    Dental no notable dental hx. (+) Teeth Intact, Dental Advisory Given   Pulmonary neg pulmonary ROS,    Pulmonary exam normal breath sounds clear to auscultation       Cardiovascular negative cardio ROS Normal cardiovascular exam Rhythm:Regular Rate:Normal     Neuro/Psych negative neurological ROS  negative psych ROS   GI/Hepatic Neg liver ROS, GERD  ,  Endo/Other  negative endocrine ROS  Renal/GU L renal calculus     Musculoskeletal   Abdominal (+) + obese,   Peds  Hematology   Anesthesia Other Findings   Reproductive/Obstetrics (+) Pregnancy PCOS                          Anesthesia Physical Anesthesia Plan  ASA: I  Anesthesia Plan: General   Post-op Pain Management:    Induction: Intravenous  PONV Risk Score and Plan: 4 or greater and Treatment may vary due to age or medical condition, Midazolam, Ondansetron and Dexamethasone  Airway Management Planned: LMA  Additional Equipment: None  Intra-op Plan:   Post-operative Plan:   Informed Consent: I have reviewed the patients History and Physical, chart, labs and discussed the procedure including the risks, benefits and alternatives for the proposed anesthesia with the patient or authorized representative who has indicated his/her understanding and acceptance.     Dental advisory given  Plan Discussed with: CRNA  Anesthesia Plan Comments:        Anesthesia Quick Evaluation

## 2019-02-09 ENCOUNTER — Ambulatory Visit (HOSPITAL_BASED_OUTPATIENT_CLINIC_OR_DEPARTMENT_OTHER)
Admission: RE | Admit: 2019-02-09 | Discharge: 2019-02-09 | Disposition: A | Discharge status: Home / Self Care | Payer: 59 | Attending: Urology | Admitting: Urology

## 2019-02-09 ENCOUNTER — Encounter (HOSPITAL_BASED_OUTPATIENT_CLINIC_OR_DEPARTMENT_OTHER): Payer: Self-pay | Admitting: Urology

## 2019-02-09 ENCOUNTER — Ambulatory Visit (HOSPITAL_BASED_OUTPATIENT_CLINIC_OR_DEPARTMENT_OTHER): Payer: 59 | Admitting: Anesthesiology

## 2019-02-09 ENCOUNTER — Encounter (HOSPITAL_BASED_OUTPATIENT_CLINIC_OR_DEPARTMENT_OTHER)
Admission: RE | Disposition: A | Discharge status: Home / Self Care | Payer: Self-pay | Source: Home / Self Care | Attending: Urology

## 2019-02-09 DIAGNOSIS — Z6832 Body mass index (BMI) 32.0-32.9, adult: Secondary | ICD-10-CM | POA: Insufficient documentation

## 2019-02-09 DIAGNOSIS — Z791 Long term (current) use of non-steroidal anti-inflammatories (NSAID): Secondary | ICD-10-CM | POA: Diagnosis not present

## 2019-02-09 DIAGNOSIS — Z79899 Other long term (current) drug therapy: Secondary | ICD-10-CM | POA: Insufficient documentation

## 2019-02-09 DIAGNOSIS — K219 Gastro-esophageal reflux disease without esophagitis: Secondary | ICD-10-CM | POA: Diagnosis not present

## 2019-02-09 DIAGNOSIS — N200 Calculus of kidney: Secondary | ICD-10-CM | POA: Insufficient documentation

## 2019-02-09 DIAGNOSIS — E669 Obesity, unspecified: Secondary | ICD-10-CM | POA: Diagnosis not present

## 2019-02-09 DIAGNOSIS — F419 Anxiety disorder, unspecified: Secondary | ICD-10-CM | POA: Insufficient documentation

## 2019-02-09 DIAGNOSIS — Z8249 Family history of ischemic heart disease and other diseases of the circulatory system: Secondary | ICD-10-CM | POA: Diagnosis not present

## 2019-02-09 DIAGNOSIS — Z7984 Long term (current) use of oral hypoglycemic drugs: Secondary | ICD-10-CM | POA: Insufficient documentation

## 2019-02-09 DIAGNOSIS — F329 Major depressive disorder, single episode, unspecified: Secondary | ICD-10-CM | POA: Diagnosis not present

## 2019-02-09 DIAGNOSIS — Z833 Family history of diabetes mellitus: Secondary | ICD-10-CM | POA: Insufficient documentation

## 2019-02-09 DIAGNOSIS — N202 Calculus of kidney with calculus of ureter: Secondary | ICD-10-CM | POA: Diagnosis present

## 2019-02-09 HISTORY — PX: HOLMIUM LASER APPLICATION: SHX5852

## 2019-02-09 HISTORY — PX: CYSTOSCOPY WITH RETROGRADE PYELOGRAM, URETEROSCOPY AND STENT PLACEMENT: SHX5789

## 2019-02-09 LAB — URINALYSIS, ROUTINE W REFLEX MICROSCOPIC
Bilirubin Urine: NEGATIVE
Glucose, UA: NEGATIVE mg/dL
Ketones, ur: NEGATIVE mg/dL
Nitrite: POSITIVE — AB
Protein, ur: 100 mg/dL — AB
RBC / HPF: >50 RBC/hpf — ABNORMAL HIGH (ref 0–5)
Specific Gravity, Urine: 1.02 (ref 1.005–1.030)
WBC, UA: >50 WBC/hpf — ABNORMAL HIGH (ref 0–5)
pH: 6 (ref 5.0–8.0)

## 2019-02-09 LAB — POCT PREGNANCY, URINE: Preg Test, Ur: NEGATIVE

## 2019-02-09 SURGERY — CYSTOURETEROSCOPY, WITH RETROGRADE PYELOGRAM AND STENT INSERTION
Anesthesia: General | Site: Ureter | Laterality: Right

## 2019-02-09 MED ORDER — ONDANSETRON HCL 4 MG/2ML IJ SOLN
INTRAMUSCULAR | Status: AC
  Filled 2019-02-09: qty 2

## 2019-02-09 MED ORDER — SENNOSIDES-DOCUSATE SODIUM 8.6-50 MG PO TABS: 2.0000 | ORAL_TABLET | Freq: Every day | ORAL | 1 refills | Status: AC | PRN

## 2019-02-09 MED ORDER — ACETAMINOPHEN 500 MG PO TABS
1000.0000 mg | ORAL_TABLET | Freq: Once | ORAL | Status: AC
  Administered 2019-02-09: 1000 mg via ORAL
  Filled 2019-02-09: qty 2

## 2019-02-09 MED ORDER — PHENAZOPYRIDINE HCL 200 MG PO TABS: 200.0000 mg | ORAL_TABLET | Freq: Three times a day (TID) | ORAL | 0 refills | Status: AC | PRN

## 2019-02-09 MED ORDER — KETOROLAC TROMETHAMINE 30 MG/ML IJ SOLN
30.0000 mg | Freq: Once | INTRAMUSCULAR | Status: DC | PRN
  Filled 2019-02-09: qty 1

## 2019-02-09 MED ORDER — OXYCODONE HCL 5 MG PO TABS
5.0000 mg | ORAL_TABLET | Freq: Once | ORAL | Status: AC | PRN
  Administered 2019-02-09: 5 mg via ORAL
  Filled 2019-02-09: qty 1

## 2019-02-09 MED ORDER — LIDOCAINE 2% (20 MG/ML) 5 ML SYRINGE
INTRAMUSCULAR | Status: DC | PRN
  Administered 2019-02-09: 60 mg via INTRAVENOUS

## 2019-02-09 MED ORDER — SCOPOLAMINE 1 MG/3DAYS TD PT72
MEDICATED_PATCH | TRANSDERMAL | Status: AC
  Filled 2019-02-09: qty 1

## 2019-02-09 MED ORDER — ACETAMINOPHEN 500 MG PO TABS
ORAL_TABLET | ORAL | Status: AC
  Filled 2019-02-09: qty 2

## 2019-02-09 MED ORDER — ONDANSETRON HCL 4 MG/2ML IJ SOLN
4.0000 mg | Freq: Once | INTRAMUSCULAR | Status: AC | PRN
  Administered 2019-02-09: 4 mg via INTRAVENOUS
  Filled 2019-02-09: qty 2

## 2019-02-09 MED ORDER — FENTANYL CITRATE (PF) 100 MCG/2ML IJ SOLN
INTRAMUSCULAR | Status: DC | PRN
  Administered 2019-02-09 (×2): 50 ug via INTRAVENOUS

## 2019-02-09 MED ORDER — OXYCODONE HCL 5 MG PO TABS
5.0000 mg | ORAL_TABLET | Freq: Once | ORAL | Status: DC | PRN
  Filled 2019-02-09: qty 1

## 2019-02-09 MED ORDER — OXYCODONE HCL 5 MG PO TABS
ORAL_TABLET | ORAL | Status: AC
  Filled 2019-02-09: qty 1

## 2019-02-09 MED ORDER — CEFAZOLIN SODIUM-DEXTROSE 2-4 GM/100ML-% IV SOLN
INTRAVENOUS | Status: AC
  Filled 2019-02-09: qty 100

## 2019-02-09 MED ORDER — SCOPOLAMINE 1 MG/3DAYS TD PT72
MEDICATED_PATCH | TRANSDERMAL | Status: DC | PRN
  Administered 2019-02-09: 1 via TRANSDERMAL

## 2019-02-09 MED ORDER — KETOROLAC TROMETHAMINE 30 MG/ML IJ SOLN
30.0000 mg | Freq: Once | INTRAMUSCULAR | Status: AC | PRN
  Administered 2019-02-09: 30 mg via INTRAVENOUS
  Filled 2019-02-09: qty 1

## 2019-02-09 MED ORDER — CEFAZOLIN SODIUM-DEXTROSE 2-4 GM/100ML-% IV SOLN
2.0000 g | INTRAVENOUS | Status: AC
  Administered 2019-02-09: 2 g via INTRAVENOUS
  Filled 2019-02-09: qty 100

## 2019-02-09 MED ORDER — LIDOCAINE 2% (20 MG/ML) 5 ML SYRINGE
INTRAMUSCULAR | Status: AC
  Filled 2019-02-09: qty 5

## 2019-02-09 MED ORDER — SODIUM CHLORIDE 0.9 % IR SOLN
Status: DC | PRN
  Administered 2019-02-09: 3000 mL

## 2019-02-09 MED ORDER — IOHEXOL 300 MG/ML  SOLN
INTRAMUSCULAR | Status: DC | PRN
  Administered 2019-02-09: 10 mL

## 2019-02-09 MED ORDER — DEXAMETHASONE SODIUM PHOSPHATE 10 MG/ML IJ SOLN
INTRAMUSCULAR | Status: DC | PRN
  Administered 2019-02-09: 10 mg via INTRAVENOUS

## 2019-02-09 MED ORDER — OXYCODONE HCL 5 MG/5ML PO SOLN
5.0000 mg | Freq: Once | ORAL | Status: DC | PRN
  Filled 2019-02-09: qty 5

## 2019-02-09 MED ORDER — TAMSULOSIN HCL 0.4 MG PO CAPS: 0.4000 mg | ORAL_CAPSULE | Freq: Every day | ORAL | 0 refills | Status: AC

## 2019-02-09 MED ORDER — DEXAMETHASONE SODIUM PHOSPHATE 10 MG/ML IJ SOLN
INTRAMUSCULAR | Status: AC
  Filled 2019-02-09: qty 1

## 2019-02-09 MED ORDER — PROPOFOL 10 MG/ML IV BOLUS
INTRAVENOUS | Status: AC
  Filled 2019-02-09: qty 40

## 2019-02-09 MED ORDER — HYDROMORPHONE HCL 1 MG/ML IJ SOLN
0.2500 mg | INTRAMUSCULAR | Status: DC | PRN
  Filled 2019-02-09: qty 0.5

## 2019-02-09 MED ORDER — CEPHALEXIN 500 MG PO CAPS: 500.0000 mg | ORAL_CAPSULE | Freq: Every day | ORAL | 0 refills | Status: AC

## 2019-02-09 MED ORDER — KETOROLAC TROMETHAMINE 30 MG/ML IJ SOLN
INTRAMUSCULAR | Status: AC
  Filled 2019-02-09: qty 1

## 2019-02-09 MED ORDER — ARTIFICIAL TEARS OPHTHALMIC OINT
TOPICAL_OINTMENT | OPHTHALMIC | Status: AC
  Filled 2019-02-09: qty 3.5

## 2019-02-09 MED ORDER — PROPOFOL 10 MG/ML IV BOLUS
INTRAVENOUS | Status: DC | PRN
  Administered 2019-02-09 (×2): 50 mg via INTRAVENOUS
  Administered 2019-02-09: 100 mg via INTRAVENOUS

## 2019-02-09 MED ORDER — LACTATED RINGERS IV SOLN
INTRAVENOUS | Status: DC
  Filled 2019-02-09 (×2): qty 1000

## 2019-02-09 MED ORDER — FENTANYL CITRATE (PF) 100 MCG/2ML IJ SOLN
INTRAMUSCULAR | Status: AC
  Filled 2019-02-09: qty 2

## 2019-02-09 MED ORDER — ONDANSETRON HCL 4 MG/2ML IJ SOLN
4.0000 mg | Freq: Once | INTRAMUSCULAR | Status: DC | PRN
  Filled 2019-02-09: qty 2

## 2019-02-09 MED ORDER — TOLTERODINE TARTRATE ER 4 MG PO CP24: 4.0000 mg | ORAL_CAPSULE | Freq: Every day | ORAL | 0 refills | Status: AC

## 2019-02-09 MED ORDER — MIDAZOLAM HCL 2 MG/2ML IJ SOLN
INTRAMUSCULAR | Status: DC | PRN
  Administered 2019-02-09: 2 mg via INTRAVENOUS

## 2019-02-09 MED ORDER — MIDAZOLAM HCL 2 MG/2ML IJ SOLN
INTRAMUSCULAR | Status: AC
  Filled 2019-02-09: qty 2

## 2019-02-09 MED ORDER — HYDROCODONE-ACETAMINOPHEN 5-325 MG PO TABS: 1.0000 | ORAL_TABLET | ORAL | 0 refills | Status: AC | PRN

## 2019-02-09 MED ORDER — OXYCODONE HCL 5 MG/5ML PO SOLN
5.0000 mg | Freq: Once | ORAL | Status: AC | PRN
  Filled 2019-02-09: qty 5

## 2019-02-09 SURGICAL SUPPLY — 21 items
BAG DRAIN URO-CYSTO SKYTR STRL (DRAIN) ×3 IMPLANT
BASKET ZERO TIP NITINOL 2.4FR (BASKET) ×3 IMPLANT
CATH URET 5FR 28IN OPEN ENDED (CATHETERS) ×3 IMPLANT
CLOTH BEACON ORANGE TIMEOUT ST (SAFETY) ×3 IMPLANT
DRSG TEGADERM 4X4.75 (GAUZE/BANDAGES/DRESSINGS) IMPLANT
EXTRACTOR STONE 1.7FRX115CM (UROLOGICAL SUPPLIES) IMPLANT
FIBER LASER FLEXIVA 200 (UROLOGICAL SUPPLIES) IMPLANT
FIBER LASER TRAC TIP (UROLOGICAL SUPPLIES) ×3 IMPLANT
GLOVE BIO SURGEON STRL SZ 6.5 (GLOVE) ×2 IMPLANT
GLOVE BIO SURGEONS STRL SZ 6.5 (GLOVE) ×1
GOWN STRL REUS W/TWL LRG LVL3 (GOWN DISPOSABLE) ×3 IMPLANT
GUIDEWIRE ANG ZIPWIRE 038X150 (WIRE) ×6 IMPLANT
GUIDEWIRE STR DUAL SENSOR (WIRE) ×3 IMPLANT
KIT TURNOVER CYSTO (KITS) IMPLANT
MANIFOLD NEPTUNE II (INSTRUMENTS) ×3 IMPLANT
PACK CYSTO (CUSTOM PROCEDURE TRAY) ×3 IMPLANT
SHEATH URETERAL 12FRX35CM (MISCELLANEOUS) ×3 IMPLANT
STENT URET 6FRX26 CONTOUR (STENTS) ×3 IMPLANT
TUBE CONNECTING 12'X1/4 (SUCTIONS) ×1
TUBE CONNECTING 12X1/4 (SUCTIONS) ×2 IMPLANT
TUBING UROLOGY SET (TUBING) ×3 IMPLANT

## 2019-02-09 NOTE — Transfer of Care (Signed)
Immediate Anesthesia Transfer of Care Note  Patient: Averianna Brugger  Procedure(s) Performed: CYSTOSCOPY WITH RETROGRADE PYELOGRAM, URETEROSCOPY AND STENT PLACEMENT (Right Ureter) HOLMIUM LASER APPLICATION (Right Ureter)  Patient Location: PACU  Anesthesia Type:General  Level of Consciousness: drowsy and patient cooperative  Airway & Oxygen Therapy: Patient Spontanous Breathing and Patient connected to nasal cannula oxygen  Post-op Assessment: Report given to RN and Post -op Vital signs reviewed and stable  Post vital signs: Reviewed and stable  Last Vitals:  Vitals Value Taken Time  BP    Temp    Pulse 91 02/09/19 0817  Resp 13 02/09/19 0817  SpO2 87 % 02/09/19 0817  Vitals shown include unvalidated device data.  Last Pain:  Vitals:   02/09/19 0555  TempSrc: Oral  PainSc: 3       Patients Stated Pain Goal: 5 (02/09/19 0555)  Complications: No apparent anesthesia complications

## 2019-02-09 NOTE — Anesthesia Procedure Notes (Signed)
Procedure Name: LMA Insertion Date/Time: 02/09/2019 7:31 AM Performed by: Tyrone Nine, CRNA Pre-anesthesia Checklist: Patient identified, Timeout performed, Emergency Drugs available, Suction available and Patient being monitored Patient Re-evaluated:Patient Re-evaluated prior to induction Oxygen Delivery Method: Circle system utilized Preoxygenation: Pre-oxygenation with 100% oxygen Induction Type: IV induction Ventilation: Mask ventilation without difficulty LMA: LMA inserted LMA Size: 4.0 Number of attempts: 1 Airway Equipment and Method: Bite block Placement Confirmation: breath sounds checked- equal and bilateral,  CO2 detector and positive ETCO2 Tube secured with: Tape Dental Injury: Teeth and Oropharynx as per pre-operative assessment

## 2019-02-09 NOTE — H&P (Signed)
CC/HPI: cc: kidney stone   12/11/18: 34 year old woman with gross hematuria the that occurred 2 weeks ago and right lower quadrant pain found to have a 6 x 8 mm right UPJ calculus. Patient denies fever, chills or emesis. She does have some nausea. Today she is not in any significant pain.   02/01/19: Patient with above noted history. She underwent attempted right URS on 12/29, but her ureter was noted to be too narrow to safely proceed with stone manipulation. Therefore, ureteral stent was placed. She is scheduled for definitive stone management, noted to be likely a staged procedure, on 2/2. She presents today for preoperative assessment. Today she complains of right lower quadrant pain, which is mostly associated with physical activity. She has been using Ibuprofen for pain management, which helps somewhat. She continues to complains of frequency and urgency. She is using Oxybutynin. No complaints of painful voiding, fevers, chills, nausea, or vomiting. She denies changes in her overall health since being seen last. No new medications.     ALLERGIES: NKDA    MEDICATIONS: Aleve  Buspirone Hcl 7.5 mg tablet  Fluoxetine Dr  Folic Acid  Ibuprofen  Metformin Hcl Er 750 mg tablet, extended release 24 hr     GU PSH: Cystoscopy Insert Stent, Right - 01/05/2019 Cystoscopy Ureteroscopy, Right - 01/05/2019       Radiation Notes: wisdom teeth   NON-GU PSH: Tonsillectomy     GU PMH: Ureteral calculus (Stable), Right, Reviewed patient's CT scan and discussed options including observation, ureteroscopy and possible ESWL depending on actual position of stone. Patient is nontoxic appearing and urine appears contaminated but probably not infected. Patient would like to be set up for ureteroscopy after discussing the risks and benefits which include bleeding, pain, need for staged procedure, and later moved stone, stent discomfort, damage to surrounding structures including urethra/bladder/ureter and  kidney. - 12/11/2018    NON-GU PMH: Anxiety Depression    FAMILY HISTORY: Diabetes - Brother, Father heart failure - Father   SOCIAL HISTORY: Marital Status: Married Preferred Language: English; Ethnicity: Not Hispanic Or Latino; Race: White Current Smoking Status: Patient has never smoked.   Tobacco Use Assessment Completed: Used Tobacco in last 30 days? Social Drinker.  Drinks 1 caffeinated drink per day.    REVIEW OF SYSTEMS:    GU Review Female:   Patient reports frequent urination and hard to postpone urination. Patient denies burning /pain with urination, get up at night to urinate, leakage of urine, stream starts and stops, trouble starting your stream, have to strain to urinate, and being pregnant.  Gastrointestinal (Upper):   Patient denies nausea, vomiting, and indigestion/ heartburn.  Gastrointestinal (Lower):   Patient denies diarrhea and constipation.  Constitutional:   Patient denies fever, night sweats, weight loss, and fatigue.  Skin:   Patient denies skin rash/ lesion and itching.  Eyes:   Patient denies blurred vision and double vision.  Ears/ Nose/ Throat:   Patient denies sore throat and sinus problems.  Hematologic/Lymphatic:   Patient denies swollen glands and easy bruising.  Cardiovascular:   Patient denies leg swelling and chest pains.  Respiratory:   Patient denies cough and shortness of breath.  Endocrine:   Patient denies excessive thirst.  Musculoskeletal:   Patient denies back pain and joint pain.  Neurological:   Patient denies headaches and dizziness.  Psychologic:   Patient denies depression and anxiety.   Notes: Pre-op    VITAL SIGNS:      02/01/2019 08:31 AM  Weight 206.5  lb / 93.67 kg  Height 65 in / 165.1 cm  BP 112/76 mmHg  Pulse 93 /min  Temperature 97.3 F / 36.2 C  BMI 34.4 kg/m   MULTI-SYSTEM PHYSICAL EXAMINATION:    Constitutional: Well-nourished. No physical deformities. Normally developed. Good grooming.  Respiratory: Normal  breath sounds. No labored breathing, no use of accessory muscles.   Cardiovascular: Regular rate and rhythm. No murmur, no gallop. Normal temperature, normal extremity pulses, no swelling, no varicosities.   Skin: No paleness, no jaundice, no cyanosis. No lesion, no ulcer, no rash.  Neurologic / Psychiatric: Oriented to time, oriented to place, oriented to person. No depression, no anxiety, no agitation.  Gastrointestinal: No mass, no tenderness, no rigidity, non obese abdomen. Mild right CVAT.   Musculoskeletal: Normal gait and station of head and neck.     PAST DATA REVIEWED:  Source Of History:  Patient  Records Review:   Previous Patient Records  Urine Test Review:   Urinalysis  X-Ray Review: C.T. Abdomen/Pelvis: Reviewed Films. Reviewed Report.     PROCEDURES:          Urinalysis w/Scope Dipstick Dipstick Cont'd Micro  Color: Amber Bilirubin: Neg mg/dL WBC/hpf: 6 - 10/hpf  Appearance: Slightly Cloudy Ketones: Neg mg/dL RBC/hpf: 40 - 60/hpf  Specific Gravity: 1.020 Blood: 3+ ery/uL Bacteria: Few (10-25/hpf)  pH: 6.0 Protein: 3+ mg/dL Cystals: NS (Not Seen)  Glucose: Neg mg/dL Urobilinogen: 0.2 mg/dL Casts: NS (Not Seen)    Nitrites: Positive Trichomonas: Not Present    Leukocyte Esterase: 2+ leu/uL Mucous: Present      Epithelial Cells: 0 - 5/hpf      Yeast: NS (Not Seen)      Sperm: Not Present    Notes: Microscopic not concentrated.    ASSESSMENT:      ICD-10 Details  1 GU:   Ureteral calculus - V49.4 Acute, Uncomplicated   PLAN:           Orders Labs Urine Culture

## 2019-02-09 NOTE — Interval H&P Note (Signed)
History and Physical Interval Note:  02/09/2019 7:00 AM  Kristen Hoover  has presented today for surgery, with the diagnosis of RIGHT RENAL CALCULUS.  The various methods of treatment have been discussed with the patient and family. After consideration of risks, benefits and other options for treatment, the patient has consented to  Procedure(s) with comments: CYSTOSCOPY WITH RETROGRADE PYELOGRAM, URETEROSCOPY AND STENT PLACEMENT (Right) - 1 HR HOLMIUM LASER APPLICATION (Right) as a surgical intervention.  The patient's history has been reviewed, patient examined, no change in status, stable for surgery.  I have reviewed the patient's chart and labs.  Questions were answered to the patient's satisfaction.     Kristen Hoover

## 2019-02-09 NOTE — Discharge Instructions (Signed)
Post kidney stone removal instructions   Definitions:  Ureter: The duct that transports urine from the kidney to the bladder. Stent: A plastic hollow tube that is placed into the ureter, from the kidney to the bladder to prevent the ureter from swelling shut.  General instructions:  Despite the fact that no skin incisions were used, the area around the ureter and bladder is raw and irritated. The stent is a foreign body which can further irritate the bladder wall. This irritation is manifested by increased frequency of urination, both day and night, and by an increase in the urge to urinate. In some, the urge to urinate is present almost always. Sometimes the urge is strong enough that you may not be able to stop your self from urinating. This can often be controlled with medication but does not occur in everyone. A stent can safely be left in place for 3 months or greater.  You may see some blood in your urine while the stent is in place and a few days afterward. Do not be alarmed, even if the urine is clear for a while. Get off your feet and drink lots of fluids until clearing occurs. If you start to pass clots or don't improve, call us.  Diet:  You may return to your normal diet immediately. Because of the raw surface of your bladder, alcohol, spicy foods, foods high in acid and drinks with caffeine may cause irritation or frequency and should be used in moderation. To keep your urine flowing freely and avoid constipation, drink plenty of fluids during the day (8-10 glasses). Tip: Avoid cranberry juice because it is very acidic.  Activity:  Your physical activity doesn't need to be restricted. However, if you are very active, you may see some blood in the urine. We suggest that you reduce your activity under the circumstances until the bleeding has stopped.  Bowels:  It is important to keep your bowels regular during the postoperative period. Straining with bowel movements can cause  bleeding. A bowel movement every other day is reasonable. Use a mild laxative if needed, such as milk of magnesia 2-3 tablespoons, or 2 Dulcolax tablets. Call if you continue to have problems. If you had been taking narcotics for pain, before, during or after your surgery, you may be constipated. Take a laxative if necessary.  Medication:  You should resume your pre-surgery medications unless told not to. In addition you may be given an antibiotic to prevent or treat infection. Antibiotics are not always necessary. All medication should be taken as prescribed until the bottles are finished unless you are having an unusual reaction to one of the drugs.  Problems you should report to [place]:  a. Fever greater than 101F. b. Heavy bleeding, or clots (see notes above about blood in urine). c. Inability to urinate. d. Drug reactions (hives, rash, nausea, vomiting, diarrhea). e. Severe burning or pain with urination that is not improving.  Stent Removal You may remove your stent on Friday, 02/12/2019.  To do this pull the string that is in the vagina until the entire stent is removed.  Please take 1 tab of Keflex prior to removing the stent to help prevent infection.   Post Anesthesia Home Care Instructions  Activity: Get plenty of rest for the remainder of the day. A responsible individual must stay with you for 24 hours following the procedure.  For the next 24 hours, DO NOT: -Drive a car -Advertising copywriter -Drink alcoholic beverages -Take any medication unless  instructed by your physician -Make any legal decisions or sign important papers.  Meals: Start with liquid foods such as gelatin or soup. Progress to regular foods as tolerated. Avoid greasy, spicy, heavy foods. If nausea and/or vomiting occur, drink only clear liquids until the nausea and/or vomiting subsides. Call your physician if vomiting continues.  Special Instructions/Symptoms: Your throat may feel dry or sore from the anesthesia  or the breathing tube placed in your throat during surgery. If this causes discomfort, gargle with warm salt water. The discomfort should disappear within 24 hours.  If you had a scopolamine patch placed behind your ear for the management of post- operative nausea and/or vomiting:  1. The medication in the patch is effective for 72 hours, after which it should be removed.  Wrap patch in a tissue and discard in the trash. Wash hands thoroughly with soap and water. 2. You may remove the patch earlier than 72 hours if you experience unpleasant side effects which may include dry mouth, dizziness or visual disturbances. 3. Avoid touching the patch. Wash your hands with soap and water after contact with the patch.    May remove patch behind left ear Thursday, February 11, 2019.

## 2019-02-09 NOTE — Op Note (Signed)
PATIENT:  Kristen Hoover  PRE-OPERATIVE DIAGNOSIS: Right renal calculus  POST-OPERATIVE DIAGNOSIS: Same  PROCEDURE: Cystoscopy, right ureteroscopy with laser lithotripsy, right ureteral stent exchange  SURGEON:  Kasandra Knudsen, MD  INDICATION: Kristen Hoover is a 34 year old woman who presented to the ER with right flank pain found to have a 6 x 8 mm right UPJ calculus.  She previously underwent right ureteral stent placement and returns for definitive stone management today.  ANESTHESIA:  General  EBL:  Minimal  DRAINS: None  LOCAL MEDICATIONS USED:  None  SPECIMEN: None  FINDINGS: 1. Normal urethra 2. Normal cystoscopy 3. 6x 8 mm right renal calculus seen in lower pole, fragmented 4. Stent encrustation seen. 6Fr x 26Fr JJ ureteral stent with string placed  Description of procedure: After informed consent the patient was taken to the operating room and placed on the table in a supine position. General anesthesia was then administered. Once fully anesthetized the patient was moved to the dorsal lithotomy position and the genitalia were sterilely prepped and draped in standard fashion. An official timeout was then performed.  A 23 French rigid cystoscope was placed in the urethral meatus and advanced into the bladder under direct visualization.  Systematic inspection revealed no abnormalities.  The existing ureteral stent was brought to the meatus with a grasper.  Attempts at advancing a sensor wire through the stent were unsuccessful due to encrustation of the stent.  A sensor wire was then advanced through the right ureteral orifice alongside the stent with fluoroscopic guidance up to the kidney.  A second wire was placed in a similar manner.  The ureteral stent was then removed.  Next a ureteral access sheath was placed over the working wire and advanced to the kidney with fluoroscopic guidance.  The inner sheath and wire were removed.  Flexible ureteroscopy then  took place which encountered the stone in the lower pole.  The stone was then fragmented with a 200 m laser fiber.  The larger fragments were then extracted with the basket.  The rest of the kidney was examined and noted to be free of any large stone fragments.  The ureter was examined as the ureteroscope and access sheath were removed and unison.  No injury to the ureter or remaining stone fragments were seen.  A 6 French by 26 cm ureteral stent was then placed over the wire with fluoroscopic guidance.  A proximal curl was seen in the kidney with fluoroscopy and a distal curl was seen under direct visualization.  The bladder was decompressed and the cystoscope was removed.  The string was placed in the patient's vagina.  The patient emerged from anesthesia without complication she was then transferred the PACU in stable condition.   PLAN OF CARE: Discharge to home after PACU  PATIENT DISPOSITION:  PACU - hemodynamically stable.

## 2019-02-09 NOTE — Anesthesia Postprocedure Evaluation (Signed)
Anesthesia Post Note  Patient: Kristen Hoover  Procedure(s) Performed: CYSTOSCOPY WITH RETROGRADE PYELOGRAM, URETEROSCOPY AND STENT PLACEMENT (Right Ureter) HOLMIUM LASER APPLICATION (Right Ureter)     Patient location during evaluation: PACU Anesthesia Type: General Level of consciousness: awake and alert Pain management: pain level controlled Vital Signs Assessment: post-procedure vital signs reviewed and stable Respiratory status: spontaneous breathing, nonlabored ventilation, respiratory function stable and patient connected to nasal cannula oxygen Cardiovascular status: blood pressure returned to baseline and stable Postop Assessment: no apparent nausea or vomiting Anesthetic complications: no    Last Vitals:  Vitals:   02/09/19 0900 02/09/19 0939  BP: 124/73 112/68  Pulse: 79 73  Resp: 14 16  Temp:  37.5 C  SpO2: 94% 99%    Last Pain:  Vitals:   02/09/19 0845  TempSrc:   PainSc: 6                  Trevor Iha

## 2019-02-22 ENCOUNTER — Encounter (INDEPENDENT_AMBULATORY_CARE_PROVIDER_SITE_OTHER): Payer: Self-pay

## 2019-09-16 ENCOUNTER — Encounter (HOSPITAL_COMMUNITY): Payer: Self-pay

## 2019-09-16 ENCOUNTER — Other Ambulatory Visit: Payer: Self-pay

## 2019-09-16 ENCOUNTER — Ambulatory Visit (HOSPITAL_COMMUNITY)
Admission: EM | Admit: 2019-09-16 | Discharge: 2019-09-16 | Disposition: A | Discharge status: Home / Self Care | Payer: 59 | Attending: Family Medicine | Admitting: Family Medicine

## 2019-09-16 DIAGNOSIS — T300 Burn of unspecified body region, unspecified degree: Secondary | ICD-10-CM

## 2019-09-16 MED ORDER — SILVER SULFADIAZINE 1 % EX CREA: 1.0000 "application " | TOPICAL_CREAM | Freq: Two times a day (BID) | CUTANEOUS | 0 refills | Status: DC | PRN

## 2019-09-16 NOTE — Discharge Instructions (Signed)
Apply the silvadene cream twice daily as needed and cover with a NONSTICK gauze pad and some sort of dressing, whether that be ace wrap, paper tape, or a tegaderm film to keep it in place.

## 2019-09-16 NOTE — ED Triage Notes (Signed)
Pt presents with complaints of burning her right hand on a hot pan today while cooking, area is red and swollen.

## 2019-09-18 NOTE — ED Provider Notes (Signed)
[facility]    CSN: [id number] Arrival date & time: 09/16/19  1932      History   Chief Complaint Chief Complaint  Patient presents with   Burn    HPI Kristen Hoover is a 34 y.o. female.   Patient presenting today with a burn to right hand near thumb from touching a hot pan while cooking this evening. She states the pain was severe initially but has calmed down now. Applied ice but has not taken anything since it happened. Denies blistering, draining, bleeding, numbness, tingling.      Past Medical History:  Diagnosis Date   Frequency of urination    GERD (gastroesophageal reflux disease)    History of pancreatitis    12-28-2018-  last episode 2006   PCOS (polycystic ovarian syndrome)    Renal calculus, right    Urgency of urination    Wears glasses     There are no problems to display for this patient.   Past Surgical History:  Procedure Laterality Date   CYSTOSCOPY WITH RETROGRADE PYELOGRAM, URETEROSCOPY AND STENT PLACEMENT Right 01/05/2019   Procedure: CYSTOSCOPY/URETEROSCOP/STENT PLACEMENT;  Surgeon: Noel Christmas, MD;  Location: [facility];  Service: Urology;  Laterality: Right;  75 MINS   CYSTOSCOPY WITH RETROGRADE PYELOGRAM, URETEROSCOPY AND STENT PLACEMENT Right 02/09/2019   Procedure: CYSTOSCOPY WITH RETROGRADE PYELOGRAM, URETEROSCOPY AND STENT PLACEMENT;  Surgeon: Noel Christmas, MD;  Location: [facility];  Service: Urology;  Laterality: Right;  1 HR   ESOPHAGOGASTRODUODENOSCOPY  2015   HOLMIUM LASER APPLICATION Right 02/09/2019   Procedure: HOLMIUM LASER APPLICATION;  Surgeon: Noel Christmas, MD;  Location: [facility];  Service: Urology;  Laterality: Right;   TONSILLECTOMY  2014   WISDOM TOOTH EXTRACTION  2018    OB History   No obstetric history on file.      Home Medications    Prior to Admission medications   Medication Sig Start Date End Date Taking?  Authorizing Provider  busPIRone (BUSPAR) 15 MG tablet Take 15 mg by mouth 2 (two) times daily.   Yes [provider]  FLUoxetine HCl (PROZAC PO) Take 30 mg by mouth at bedtime.   Yes [provider]  folic acid (FOLVITE) 0.5 MG tablet Take 0.5 mg by mouth daily.   Yes [provider]  metFORMIN (GLUCOPHAGE) 500 MG tablet Take 2 tablets by mouth at bedtime.    Yes [provider]  calcium carbonate (TUMS - DOSED IN MG ELEMENTAL CALCIUM) 500 MG chewable tablet Chew 1 tablet by mouth as needed for indigestion or heartburn.    [provider]  DM-Doxylamine-Acetaminophen (NYQUIL COLD & FLU PO) Take 15 mLs by mouth as needed (cough and cold).    [provider]  HYDROcodone-acetaminophen (NORCO/VICODIN) 5-325 MG tablet Take 1 tablet by mouth every 4 (four) hours as needed for moderate pain. 02/09/19 02/09/20  Kasandra Knudsen D, MD  Naproxen Sodium 220 MG CAPS Take 2 tablets by mouth 2 (two) times daily as needed for pain.    [provider]  Pseudoephedrine-APAP-DM (DAYQUIL PO) Take 2 capsules by mouth as needed (cough and cold).    [provider]  senna-docusate (SENOKOT-S) 8.6-50 MG tablet Take 2 tablets by mouth daily as needed for mild constipation. 02/09/19 02/09/20  Noel Christmas, MD  silver sulfADIAZINE (SILVADENE) 1 % cream Apply 1 application topically 2 (two) times daily as needed. 09/16/19   Particia Nearing, PA-C  Family History Family History  Problem Relation Age of Onset   Fibromyalgia Mother    Diabetes Father    Heart failure Father     Social History Social History   Tobacco Use   Smoking status: Never Smoker   Smokeless tobacco: Never Used  Building services engineer Use: Never used  Substance Use Topics   Alcohol use: Not Currently   Drug use: Not Currently    Types: Marijuana    Comment: 12-28-2018  per pt last used 5 wks ago     Allergies   Patient has no known allergies.   Review of  Systems Review of Systems PER HPI    Physical Exam Triage Vital Signs ED Triage Vitals  Enc Vitals Group     BP 09/16/19 2126 113/75     Pulse Rate 09/16/19 2126 81     Resp 09/16/19 2126 19     Temp 09/16/19 2126 98.5 F (36.9 C)     Temp src --      SpO2 09/16/19 2126 100 %     Weight --      Height --      Head Circumference --      Peak Flow --      Pain Score 09/16/19 2123 4     Pain Loc --      Pain Edu? --      Excl. in GC? --    No data found.  Updated Vital Signs BP 113/75    Pulse 81    Temp 98.5 F (36.9 C)    Resp 19    LMP 08/16/2019    SpO2 100%   Visual Acuity Right Eye Distance:   Left Eye Distance:   Bilateral Distance:    Right Eye Near:   Left Eye Near:    Bilateral Near:     Physical Exam Vitals and nursing note reviewed.  Constitutional:      Appearance: Normal appearance. She is not ill-appearing.  HENT:     Head: Atraumatic.  Eyes:     Extraocular Movements: Extraocular movements intact.     Conjunctiva/sclera: Conjunctivae normal.  Cardiovascular:     Rate and Rhythm: Normal rate and regular rhythm.     Pulses: Normal pulses.     Heart sounds: Normal heart sounds.  Pulmonary:     Effort: Pulmonary effort is normal.     Breath sounds: Normal breath sounds.  Musculoskeletal:        General: Normal range of motion.     Cervical back: Normal range of motion and neck supple.  Skin:    General: Skin is warm and dry.     Comments: 2 inch oval shaped first degree burn to right hand near thumb on dorsal aspect. No blistering present  Neurological:     Mental Status: She is alert and oriented to person, place, and time.     Sensory: No sensory deficit.  Psychiatric:        Mood and Affect: Mood normal.        Thought Content: Thought content normal.        Judgment: Judgment normal.      UC Treatments / Results  Labs (all labs ordered are listed, but only abnormal results are displayed) Labs Reviewed - No data to  display  EKG   Radiology No results found.  Procedures Procedures (including critical care time)  Medications Ordered in UC Medications - No data to display  Initial  Impression / Assessment and Plan / UC Course  I have reviewed the triage vital signs and the nursing notes.  Pertinent labs & imaging results that were available during my care of the patient were reviewed by me and considered in my medical decision making (see chart for details).     First degree burn, small surface area. Silvadene cream sent, discussed nonstick gauze and coban wrap and that it may blister over the next few days. OTC pain relievers prn. Return precautions reviewed.   Final Clinical Impressions(s) / UC Diagnoses   Final diagnoses:  Burn     Discharge Instructions     Apply the silvadene cream twice daily as needed and cover with a NONSTICK gauze pad and some sort of dressing, whether that be ace wrap, paper tape, or a tegaderm film to keep it in place.     ED Prescriptions    Medication Sig Dispense Auth. Provider   silver sulfADIAZINE (SILVADENE) 1 % cream Apply 1 application topically 2 (two) times daily as needed. 50 g Particia Nearing, [place]     PDMP not reviewed this encounter.   Particia Nearing, [place] 09/18/19 1951

## 2019-10-11 ENCOUNTER — Other Ambulatory Visit: Payer: 59

## 2020-03-01 ENCOUNTER — Ambulatory Visit (INDEPENDENT_AMBULATORY_CARE_PROVIDER_SITE_OTHER): Payer: 59 | Admitting: Family Medicine

## 2020-03-01 ENCOUNTER — Encounter: Payer: Self-pay | Admitting: Family Medicine

## 2020-03-01 ENCOUNTER — Other Ambulatory Visit: Payer: Self-pay

## 2020-03-01 VITALS — BP 118/80 | HR 94 | Temp 98.0°F | Ht 66.0 in | Wt 230.8 lb

## 2020-03-01 DIAGNOSIS — L918 Other hypertrophic disorders of the skin: Secondary | ICD-10-CM | POA: Diagnosis not present

## 2020-03-01 DIAGNOSIS — Z23 Encounter for immunization: Secondary | ICD-10-CM

## 2020-03-01 DIAGNOSIS — Z0001 Encounter for general adult medical examination with abnormal findings: Secondary | ICD-10-CM | POA: Diagnosis not present

## 2020-03-01 DIAGNOSIS — F32A Depression, unspecified: Secondary | ICD-10-CM | POA: Diagnosis not present

## 2020-03-01 DIAGNOSIS — Z0000 Encounter for general adult medical examination without abnormal findings: Secondary | ICD-10-CM

## 2020-03-01 DIAGNOSIS — R238 Other skin changes: Secondary | ICD-10-CM

## 2020-03-01 DIAGNOSIS — Z8742 Personal history of other diseases of the female genital tract: Secondary | ICD-10-CM | POA: Insufficient documentation

## 2020-03-01 DIAGNOSIS — F419 Anxiety disorder, unspecified: Secondary | ICD-10-CM

## 2020-03-01 DIAGNOSIS — Z114 Encounter for screening for human immunodeficiency virus [HIV]: Secondary | ICD-10-CM

## 2020-03-01 DIAGNOSIS — R41840 Attention and concentration deficit: Secondary | ICD-10-CM | POA: Insufficient documentation

## 2020-03-01 DIAGNOSIS — Z1159 Encounter for screening for other viral diseases: Secondary | ICD-10-CM

## 2020-03-01 LAB — COMPREHENSIVE METABOLIC PANEL
ALT: 31 U/L (ref 0–35)
AST: 24 U/L (ref 0–37)
Albumin: 4.2 g/dL (ref 3.5–5.2)
Alkaline Phosphatase: 84 U/L (ref 39–117)
BUN: 9 mg/dL (ref 6–23)
CO2: 26 mEq/L (ref 19–32)
Calcium: 9.4 mg/dL (ref 8.4–10.5)
Chloride: 103 mEq/L (ref 96–112)
Creatinine, Ser: 0.71 mg/dL (ref 0.40–1.20)
GFR: 110.28 mL/min (ref >60.00)
Glucose, Bld: 86 mg/dL (ref 70–99)
Potassium: 3.7 mEq/L (ref 3.5–5.1)
Sodium: 136 mEq/L (ref 135–145)
Total Bilirubin: 0.6 mg/dL (ref 0.2–1.2)
Total Protein: 7.6 g/dL (ref 6.0–8.3)

## 2020-03-01 LAB — LIPID PANEL
Cholesterol: 178 mg/dL (ref 0–200)
HDL: 42.3 mg/dL (ref >39.00)
LDL Cholesterol: 106 mg/dL — ABNORMAL HIGH (ref 0–99)
NonHDL: 135.34
Total CHOL/HDL Ratio: 4
Triglycerides: 146 mg/dL (ref 0.0–149.0)
VLDL: 29.2 mg/dL (ref 0.0–40.0)

## 2020-03-01 LAB — CBC WITH DIFFERENTIAL/PLATELET
Basophils Absolute: 0.1 10*3/uL (ref 0.0–0.1)
Basophils Relative: 0.9 % (ref 0.0–3.0)
Eosinophils Absolute: 0.1 10*3/uL (ref 0.0–0.7)
Eosinophils Relative: 1.3 % (ref 0.0–5.0)
HCT: 36.8 % (ref 36.0–46.0)
Hemoglobin: 12.8 g/dL (ref 12.0–15.0)
Lymphocytes Relative: 28.3 % (ref 12.0–46.0)
Lymphs Abs: 2.9 10*3/uL (ref 0.7–4.0)
MCHC: 34.8 g/dL (ref 30.0–36.0)
MCV: 82.3 fl (ref 78.0–100.0)
Monocytes Absolute: 0.8 10*3/uL (ref 0.1–1.0)
Monocytes Relative: 7.6 % (ref 3.0–12.0)
Neutro Abs: 6.4 10*3/uL (ref 1.4–7.7)
Neutrophils Relative %: 61.9 % (ref 43.0–77.0)
Platelets: 253 10*3/uL (ref 150.0–400.0)
RBC: 4.48 Mil/uL (ref 3.87–5.11)
RDW: 14 % (ref 11.5–15.5)
WBC: 10.4 10*3/uL (ref 4.0–10.5)

## 2020-03-01 LAB — VITAMIN D 25 HYDROXY (VIT D DEFICIENCY, FRACTURES): VITD: 22.52 ng/mL — ABNORMAL LOW (ref 30.00–100.00)

## 2020-03-01 LAB — TSH: TSH: 3.38 u[IU]/mL (ref 0.35–4.50)

## 2020-03-01 MED ORDER — KETOCONAZOLE 2 % EX SHAM: 1.0000 "application " | MEDICATED_SHAMPOO | CUTANEOUS | 1 refills | Status: DC

## 2020-03-01 NOTE — Progress Notes (Signed)
Patient: Kristen Hoover MRN: [id number] DOB: Aug 26, 1985 PCP: Patient, No Pcp Per     Subjective:  Chief Complaint  Patient presents with  . Annual Exam  . Anxiety  . Depression  . dry, flaky scalp  . skin tags    HPI: The patient is a 35 y.o. female who presents today for annual exam and establishing care. She denies any changes to past medical history. There has been no recent hospitalizations. She is not following a well balanced diet and exercise plan. Weight has been fluctuating a lot. She has concerns about overall physical health since not having a physical in a long time. Followed by gyn for her PCOS and sees psychiatry for depression/anxiety.   She has no family hx of colon or breast cancer in first degree relative.   She does have anxiety and depression and is followed by psychiatry. Sees a mid level who just started her on concerta with no testing for decreased concentration. She has anxiety and the concerta made her so sick. She would like to get tested as would I. Appears to be controlled on prozac and buspar. Increased her prozac to 30mg  but did not like how this made her feel so dropped back down to 20mg /day.   She also has had a dry and flaky scalp since 2016 after bleaching her hair. She has tried tea tree oil and anti dandruff shampoo with no relief.   Skin tags around neck and one on belly. They are bothering her. She is interested in getting removed today.   Immunization History  Administered Date(s) Administered  . Influenza-Unspecified 12/09/2019  . PFIZER(Purple Top)SARS-COV-2 Vaccination 04/01/2019, 04/22/2019, 12/09/2019  . Tdap 03/01/2020   Colonoscopy: routine screening  Mammogram: routine screening  Pap smear: utd, 2021. Will get records for me.  Needs tdap    Review of Systems  Constitutional: Negative for chills, fatigue and fever.  HENT: Negative for dental problem, ear pain, hearing loss and trouble swallowing.   Eyes: Negative for  visual disturbance.  Respiratory: Negative for cough, chest tightness and shortness of breath.   Cardiovascular: Negative for chest pain, palpitations and leg swelling.  Gastrointestinal: Negative for abdominal pain, blood in stool, diarrhea and nausea.  Endocrine: Negative for cold intolerance, polydipsia, polyphagia and polyuria.  Genitourinary: Negative for dysuria and hematuria.  Musculoskeletal: Negative for arthralgias.  Skin: Negative for rash.  Neurological: Negative for dizziness and headaches.  Psychiatric/Behavioral: Positive for decreased concentration and dysphoric mood. Negative for sleep disturbance. The patient is not nervous/anxious.     Allergies Patient has No Known Allergies.  Past Medical History Patient  has a past medical history of Frequency of urination, GERD (gastroesophageal reflux disease), History of pancreatitis, PCOS (polycystic ovarian syndrome), Renal calculus, right, Urgency of urination, and Wears glasses.  Surgical History Patient  has a past surgical history that includes Wisdom tooth extraction (2018); Tonsillectomy (2014); Esophagogastroduodenoscopy (2015); Cystoscopy with retrograde pyelogram, ureteroscopy and stent placement (Right, 01/05/2019); Cystoscopy with retrograde pyelogram, ureteroscopy and stent placement (Right, 02/09/2019); and Holmium laser application (Right, 02/09/2019).  Family History Pateint's family history includes Diabetes in her father; Fibromyalgia in her mother; Heart failure in her father.  Social History Patient  reports that she has never smoked. She has never used smokeless tobacco. She reports previous alcohol use. She reports previous drug use. Drug: Marijuana.    Objective: Vitals:   03/01/20 0819  BP: 118/80  Pulse: 94  Temp: 98 F (36.7 C)  TempSrc: Temporal  SpO2: 98%  Weight: 230 lb 12.8 oz (104.7 kg)  Height: 5\' 6"  (1.676 m)    Body mass index is 37.25 kg/m.  Physical Exam Vitals reviewed.   Constitutional:      General: She is not in acute distress.    Appearance: Normal appearance. She is well-developed and well-nourished. She is obese. She is not ill-appearing.  HENT:     Head: Normocephalic and atraumatic.     Right Ear: Tympanic membrane, ear canal and external ear normal.     Left Ear: Tympanic membrane, ear canal and external ear normal.     Mouth/Throat:     Mouth: Oropharynx is clear and moist. Mucous membranes are moist.  Eyes:     Extraocular Movements: Extraocular movements intact and EOM normal.     Conjunctiva/sclera: Conjunctivae normal.     Pupils: Pupils are equal, round, and reactive to light.  Neck:     Thyroid: No thyromegaly.  Cardiovascular:     Rate and Rhythm: Normal rate and regular rhythm.     Pulses: Normal pulses and intact distal pulses.     Heart sounds: Normal heart sounds. No murmur heard.   Pulmonary:     Effort: Pulmonary effort is normal.     Breath sounds: Normal breath sounds.  Abdominal:     General: Bowel sounds are normal. There is no distension.     Palpations: Abdomen is soft.     Tenderness: There is no abdominal tenderness.  Musculoskeletal:     Cervical back: Normal range of motion and neck supple.  Lymphadenopathy:     Cervical: No cervical adenopathy.  Skin:    General: Skin is warm and dry.     Capillary Refill: Capillary refill takes less than 2 seconds.     Findings: No rash.     Comments: Multiple skin tags on neck and one on abdomen.  Scalp with dryness and flakiness   Neurological:     General: No focal deficit present.     Mental Status: She is alert and oriented to person, place, and time.     Cranial Nerves: No cranial nerve deficit.     Coordination: Coordination normal.     Deep Tendon Reflexes: Reflexes normal.  Psychiatric:        Mood and Affect: Mood and affect and mood normal.        Behavior: Behavior normal.      Procedure note Verbal consent obtained to remove skin tags. Risks  discussed including bleeding, scarring, pain and possibility that they return. Area cleaned with alcohol. Scissors and forceps used to remove skin tag on abdomen, 3 on right side of her neck and then 4 on left side of neck. No bleeding. Tolerated well. She had 2 skin tags that were not pedunculated enough so I did a light freeze with cryotherapy. Wound care instructions given. She tolerated well.     Flowsheet Row Office Visit from 03/01/2020 in [facility]  PHQ-9 Total Score 11      Assessment/plan: 1. Annual physical exam Will come back for fasting labs. HM reviewed. tdap today. Asked that she get her pap smear results for me and will check hep C. She is working on [facility] and changing diet. Really encouraged exercise.  Patient counseling [x]    Nutrition: Stressed importance of moderation in sodium/caffeine intake, saturated fat and cholesterol, caloric balance, sufficient intake of fresh fruits, vegetables, fiber, calcium, iron, and 1 mg of folate supplement per day (for females capable of  pregnancy).  [x]    Stressed the importance of regular exercise.   []    Substance Abuse: Discussed cessation/primary prevention of tobacco, alcohol, or other drug use; driving or other dangerous activities under the influence; availability of treatment for abuse.   [x]    Injury prevention: Discussed safety belts, safety helmets, smoke detector, smoking near bedding or upholstery.   [x]    Sexuality: Discussed sexually transmitted diseases, partner selection, use of condoms, avoidance of unintended pregnancy  and contraceptive alternatives.  [x]    Dental health: Discussed importance of regular tooth brushing, flossing, and dental visits.  [x]    Health maintenance and immunizations reviewed. Please refer to Health maintenance section.    - CBC with Differential/Platelet; Future - Comprehensive metabolic panel; Future - Lipid panel; Future - TSH; Future - TSH - Lipid panel -  Comprehensive metabolic panel - CBC with Differential/Platelet  2. Encounter for hepatitis C screening test for low risk patient  - Hepatitis C antibody; Future - Hepatitis C antibody  3. Encounter for screening for HIV  - HIV Antibody (routine testing w rflx); Future - HIV Antibody (routine testing w rflx)  4. Need for Tdap vaccination  - Tdap vaccine greater than or equal to 7yo IM  5. Attention and concentration deficit Followed by psychiatry. recommended formal testing before starting medication and that I require formal testing before medication is initiated.   6. Anxiety and depression phq9 well controlled. Doing well on her prozac and buspar. May change over to me for management. im okay doing this, f/u in 6 months.  - VITAMIN D 25 Hydroxy (Vit-D Deficiency, Fractures); Future - VITAMIN D 25 Hydroxy (Vit-D Deficiency, Fractures)  7. Scalp irritation Likely seborrheic dermatitis. Trial of ketoconazole shampoo. Discussed how to use this. Referral to derm as well placed.  - Ambulatory referral to Dermatology  8. Skin tag Removed. Tolerated well. See procedure note. Wound care instructions given.    This visit occurred during the SARS-CoV-2 public health emergency.  Safety protocols were in place, including screening questions prior to the visit, additional usage of staff PPE, and extensive cleaning of exam room while observing appropriate contact time as indicated for disinfecting solutions.     Return in about 6 months (around 08/29/2020) for anxiety/depression .     , MD Rudy [facility]  03/01/2020

## 2020-03-01 NOTE — Patient Instructions (Addendum)
-come back for fasting labs. No food x 8 hours.  -sent in shampoo for your scalp derm and referral to [facility] dermatology  -pap smear.  Let me know when you need refills.   See you in 6 months for anxiety/depression.    Preventive Care 67-36 Years Old, Female Preventive care refers to lifestyle choices and visits with your health care provider that can promote health and wellness. This includes:  A yearly physical exam. This is also called an annual wellness visit.  Regular dental and eye exams.  Immunizations.  Screening for certain conditions.  Healthy lifestyle choices, such as: ? Eating a healthy diet. ? Getting regular exercise. ? Not using drugs or products that contain nicotine and tobacco. ? Limiting alcohol use. What can I expect for my preventive care visit? Physical exam Your health care provider may check your:  Height and weight. These may be used to calculate your BMI (body mass index). BMI is a measurement that tells if you are at a healthy weight.  Heart rate and blood pressure.  Body temperature.  Skin for abnormal spots. Counseling Your health care provider may ask you questions about your:  Past medical problems.  Family's medical history.  Alcohol, tobacco, and drug use.  Emotional well-being.  Home life and relationship well-being.  Sexual activity.  Diet, exercise, and sleep habits.  Work and work Statistician.  Access to firearms.  Method of birth control.  Menstrual cycle.  Pregnancy history. What immunizations do I need? Vaccines are usually given at various ages, according to a schedule. Your health care provider will recommend vaccines for you based on your age, medical history, and lifestyle or other factors, such as travel or where you work.   What tests do I need? Blood tests  Lipid and cholesterol levels. These may be checked every 5 years starting at age 91.  Hepatitis C test.  Hepatitis B  test. Screening  Diabetes screening. This is done by checking your blood sugar (glucose) after you have not eaten for a while (fasting).  STD (sexually transmitted disease) testing, if you are at risk.  BRCA-related cancer screening. This may be done if you have a family history of breast, ovarian, tubal, or peritoneal cancers.  Pelvic exam and Pap test. This may be done every 3 years starting at age 48. Starting at age 39, this may be done every 5 years if you have a Pap test in combination with an HPV test. Talk with your health care provider about your test results, treatment options, and if necessary, the need for more tests.   Follow these instructions at home: Eating and drinking  Eat a healthy diet that includes fresh fruits and vegetables, whole grains, lean protein, and low-fat dairy products.  Take vitamin and mineral supplements as recommended by your health care provider.  Do not drink alcohol if: ? Your health care provider tells you not to drink. ? You are pregnant, may be pregnant, or are planning to become pregnant.  If you drink alcohol: ? Limit how much you have to 0-1 drink a day. ? Be aware of how much alcohol is in your drink. In the [place] one drink equals one 12 oz bottle of beer (355 mL), one 5 oz glass of wine (148 mL), or one 1 oz glass of hard liquor (44 mL).   Lifestyle  Take daily care of your teeth and gums. Brush your teeth every morning and night with fluoride toothpaste. Floss one time each  day.  Stay active. Exercise for at least 30 minutes 5 or more days each week.  Do not use any products that contain nicotine or tobacco, such as cigarettes, e-cigarettes, and chewing tobacco. If you need help quitting, ask your health care provider.  Do not use drugs.  If you are sexually active, practice safe sex. Use a condom or other form of protection to prevent STIs (sexually transmitted infections).  If you do not wish to become pregnant, use a form of  birth control. If you plan to become pregnant, see your health care provider for a prepregnancy visit.  Find healthy ways to cope with stress, such as: ? Meditation, yoga, or listening to music. ? Journaling. ? Talking to a trusted person. ? Spending time with friends and family. Safety  Always wear your seat belt while driving or riding in a vehicle.  Do not drive: ? If you have been drinking alcohol. Do not ride with someone who has been drinking. ? When you are tired or distracted. ? While texting.  Wear a helmet and other protective equipment during sports activities.  If you have firearms in your house, make sure you follow all gun safety procedures.  Seek help if you have been physically or sexually abused. What's next?  Go to your health care provider once a year for an annual wellness visit.  Ask your health care provider how often you should have your eyes and teeth checked.  Stay up to date on all vaccines. This information is not intended to replace advice given to you by your health care provider. Make sure you discuss any questions you have with your health care provider. Document Revised: 08/22/2019 Document Reviewed: 09/04/2017 Elsevier Patient Education  2021 [place]. Seborrheic Dermatitis, Adult Seborrheic dermatitis is a skin disease that causes red, scaly patches. It usually occurs on the scalp, and it is often called dandruff. The patches may appear on other parts of the body. Skin patches tend to appear where there are many oil glands in the skin. Areas of the body that are commonly affected include the:  Scalp.  Ears.  Eyebrows.  Face.  Bearded area of [employer].  Skin folds of the body, such as the armpits, groin, and buttocks.  Chest. The condition may come and go for no known reason, and it is often long-lasting (chronic). What are the causes? The cause of this condition is not known. What increases the risk? The following factors  may make you more likely to develop this condition:  Having certain conditions, such as: ? HIV (human immunodeficiency virus). ? AIDS (acquired immunodeficiency syndrome). ? Parkinson's disease. ? Mood disorders, such as depression.  Being 39-82 years old. What are the signs or symptoms? Symptoms of this condition include:  Thick scales on the scalp.  Redness on the face or in the armpits.  Skin that is flaky. The flakes may be white or yellow.  Skin that seems oily or dry but is not helped with moisturizers.  Itching or burning in the affected areas.   How is this diagnosed? This condition is diagnosed with a medical history and physical exam. A sample of your skin may be tested (skin biopsy). You may need to see a skin specialist (dermatologist). How is this treated? There is no cure for this condition, but treatment can help to manage the symptoms. You may get treatment to remove scales, lower the risk of skin infection, and reduce swelling or itching. Treatment may include:  Creams that reduce skin yeast.  Medicated shampoo.  Moisturizing creams or ointments.  Creams that reduce swelling and irritation (steroids). Follow these instructions at home:  Apply over-the-counter and prescription medicines only as told by your health care provider.  Use any medicated shampoo, skin creams, or ointments only as told by your health care provider.  Keep all follow-up visits as told by your health care provider. This is important. Contact a health care provider if:  Your symptoms do not improve with treatment.  Your symptoms get worse.  You have new symptoms. Get help right away if:  Your condition rapidly worsens with treatment. Summary  Seborrheic dermatitis is a skin disease that causes red, scaly patches.  Seborrheic dermatitis commonly affects the scalp, face, and skin folds.  There is no cure for this condition, but treatment can help to manage the symptoms. This  information is not intended to replace advice given to you by your health care provider. Make sure you discuss any questions you have with your health care provider. Document Revised: 10/01/2018 Document Reviewed: 10/01/2018 Elsevier Patient Education  [place].

## 2020-03-02 ENCOUNTER — Other Ambulatory Visit: Payer: Self-pay | Admitting: Family Medicine

## 2020-03-02 DIAGNOSIS — E559 Vitamin D deficiency, unspecified: Secondary | ICD-10-CM

## 2020-03-02 LAB — HEPATITIS C ANTIBODY
Hepatitis C Ab: NONREACTIVE
SIGNAL TO CUT-OFF: 0.01 (ref <1.00)

## 2020-03-02 LAB — HIV ANTIBODY (ROUTINE TESTING W REFLEX): HIV 1&2 Ab, 4th Generation: NONREACTIVE

## 2020-03-02 MED ORDER — VITAMIN D (ERGOCALCIFEROL) 1.25 MG (50000 UNIT) PO CAPS: ORAL_CAPSULE | ORAL | 0 refills | Status: DC

## 2020-08-30 ENCOUNTER — Ambulatory Visit: Payer: 59 | Admitting: Physician Assistant

## 2020-08-30 ENCOUNTER — Encounter: Payer: Self-pay | Admitting: Physician Assistant

## 2020-08-30 ENCOUNTER — Other Ambulatory Visit (HOSPITAL_COMMUNITY)
Admission: RE | Admit: 2020-08-30 | Discharge: 2020-08-30 | Disposition: A | Discharge status: Home / Self Care | Payer: 59 | Source: Ambulatory Visit | Attending: Physician Assistant | Admitting: Physician Assistant

## 2020-08-30 ENCOUNTER — Other Ambulatory Visit: Payer: Self-pay

## 2020-08-30 VITALS — BP 121/83 | HR 71 | Temp 98.2°F | Ht 66.0 in | Wt 227.2 lb

## 2020-08-30 DIAGNOSIS — N760 Acute vaginitis: Secondary | ICD-10-CM | POA: Diagnosis present

## 2020-08-30 DIAGNOSIS — J028 Acute pharyngitis due to other specified organisms: Secondary | ICD-10-CM

## 2020-08-30 DIAGNOSIS — R5383 Other fatigue: Secondary | ICD-10-CM | POA: Diagnosis not present

## 2020-08-30 LAB — POCT RAPID STREP A (OFFICE): Rapid Strep A Screen: NEGATIVE

## 2020-08-30 NOTE — Patient Instructions (Signed)
We will call with results of your tests. Please push fluids, use salt water gargles, and tea with honey. Call sooner if any changes.

## 2020-08-30 NOTE — Progress Notes (Signed)
Acute Office Visit  Subjective:    Patient ID: Kristen Hoover, female    DOB: 1985-09-30, 35 y.o.   MRN: [id number]  Chief Complaint  Patient presents with   Dysuria   Vaginal Itching    With burning    HPI Patient is in today for several symptoms:  Early August went into a hot-tub and developed folliculitis, treated with antibiotics at that time. Use Monistat, one treatment, which did help some of the burning symptoms. A lot of itching. No odor. Monogamous with husband. No hx of STIs.   Also has ST and fatigue for the last week and a half. Multiple COVID tests have been negative. No known sick contacts. Works from home. No fever, chills, or body aches. Hx tonsillectomy.   LMP 08/07/20. Pt has hx of PCOS and husband had vasectomy.   Past Medical History:  Diagnosis Date   Frequency of urination    GERD (gastroesophageal reflux disease)    History of pancreatitis    12-28-2018-  last episode 2006   PCOS (polycystic ovarian syndrome)    Renal calculus, right    Urgency of urination    Wears glasses     Past Surgical History:  Procedure Laterality Date   CYSTOSCOPY WITH RETROGRADE PYELOGRAM, URETEROSCOPY AND STENT PLACEMENT Right 01/05/2019   Procedure: CYSTOSCOPY/URETEROSCOP/STENT PLACEMENT;  Surgeon: Noel Christmas, MD;  Location: [facility];  Service: Urology;  Laterality: Right;  75 MINS   CYSTOSCOPY WITH RETROGRADE PYELOGRAM, URETEROSCOPY AND STENT PLACEMENT Right 02/09/2019   Procedure: CYSTOSCOPY WITH RETROGRADE PYELOGRAM, URETEROSCOPY AND STENT PLACEMENT;  Surgeon: Noel Christmas, MD;  Location: [facility];  Service: Urology;  Laterality: Right;  1 HR   ESOPHAGOGASTRODUODENOSCOPY  2015   HOLMIUM LASER APPLICATION Right 02/09/2019   Procedure: HOLMIUM LASER APPLICATION;  Surgeon: Noel Christmas, MD;  Location: [facility];  Service: Urology;  Laterality: Right;   TONSILLECTOMY  2014   WISDOM TOOTH EXTRACTION   2018    Family History  Problem Relation Age of Onset   Fibromyalgia Mother    Diabetes Father    Heart failure Father     Social History   Socioeconomic History   Marital status: Married    Spouse name: Not on file   Number of children: Not on file   Years of education: Not on file   Highest education level: Not on file  Occupational History   Not on file  Tobacco Use   Smoking status: Never   Smokeless tobacco: Never  Vaping Use   Vaping Use: Never used  Substance and Sexual Activity   Alcohol use: Not Currently   Drug use: Not Currently    Types: Marijuana    Comment: 12-28-2018  per pt last used 5 wks ago   Sexual activity: Not on file    Comment: husband had vasectomy  Other Topics Concern   Not on file  Social History Narrative   Not on file   Social Determinants of Health   Financial Resource Strain: Not on file  Food Insecurity: Not on file  Transportation Needs: Not on file  Physical Activity: Not on file  Stress: Not on file  Social Connections: Not on file  Intimate Partner Violence: Not on file    Outpatient Medications Prior to Visit  Medication Sig Dispense Refill   busPIRone (BUSPAR) 15 MG tablet Take 15 mg by mouth 2 (two) times daily.     calcium carbonate (TUMS -  DOSED IN MG ELEMENTAL CALCIUM) 500 MG chewable tablet Chew 1 tablet by mouth as needed for indigestion or heartburn.     FLUoxetine HCl (PROZAC PO) Take 30 mg by mouth at bedtime.     folic acid (FOLVITE) 0.5 MG tablet Take 0.5 mg by mouth daily.     ketoconazole (NIZORAL) 2 % shampoo Apply 1 application topically 2 (two) times a week. 120 mL 1   metFORMIN (GLUCOPHAGE-XR) 750 MG 24 hr tablet Take 750 mg by mouth 2 (two) times daily.     Vitamin D, Ergocalciferol, (DRISDOL) 1.25 MG (50000 UNIT) CAPS capsule One capsule by mouth once a week for 8 weeks. Then take 2000IU/day 8 capsule 0   No facility-administered medications prior to visit.    No Known Allergies  Review of  Systems REFER TO HPI FOR PERTINENT POSITIVES AND NEGATIVES     Objective:    Physical Exam Vitals and nursing note reviewed. Exam conducted with a chaperone present.  Constitutional:      Appearance: Normal appearance. She is obese.  HENT:     Mouth/Throat:     Mouth: Mucous membranes are moist.     Tongue: No lesions.     Palate: No mass.     Pharynx: Oropharynx is clear. Uvula midline. No pharyngeal swelling, oropharyngeal exudate, posterior oropharyngeal erythema or uvula swelling.  Genitourinary:    Vagina: Vaginal discharge (thick clear / white) present. No erythema, tenderness, bleeding or lesions.     Cervix: Normal.     Adnexa: Right adnexa normal and left adnexa normal.  Neurological:     Mental Status: She is alert.    BP 121/83   Pulse 71   Temp 98.2 F (36.8 C) (Temporal)   Ht 5\' 6"  (1.676 m)   Wt 227 lb 3.2 oz (103.1 kg)   SpO2 97%   BMI 36.67 kg/m  Wt Readings from Last 3 Encounters:  08/30/20 227 lb 3.2 oz (103.1 kg)  03/01/20 230 lb 12.8 oz (104.7 kg)  02/09/19 202 lb 12.8 oz (92 kg)    Health Maintenance Due  Topic Date Due   Pneumococcal Vaccine 78-54 Years old (1 - PCV) Never done   PAP SMEAR-Modifier  Never done   COVID-19 Vaccine (4 - Booster for Pfizer series) 03/08/2020   INFLUENZA VACCINE  08/07/2020    There are no preventive care reminders to display for this patient.   Lab Results  Component Value Date   TSH 3.38 03/01/2020   Lab Results  Component Value Date   WBC 10.4 03/01/2020   HGB 12.8 03/01/2020   HCT 36.8 03/01/2020   MCV 82.3 03/01/2020   PLT 253.0 03/01/2020   Lab Results  Component Value Date   NA 136 03/01/2020   K 3.7 03/01/2020   CO2 26 03/01/2020   GLUCOSE 86 03/01/2020   BUN 9 03/01/2020   CREATININE 0.71 03/01/2020   BILITOT 0.6 03/01/2020   ALKPHOS 84 03/01/2020   AST 24 03/01/2020   ALT 31 03/01/2020   PROT 7.6 03/01/2020   ALBUMIN 4.2 03/01/2020   CALCIUM 9.4 03/01/2020   ANIONGAP 10 03/12/2018    GFR 110.28 03/01/2020   Lab Results  Component Value Date   CHOL 178 03/01/2020   Lab Results  Component Value Date   HDL 42.30 03/01/2020   Lab Results  Component Value Date   LDLCALC 106 (H) 03/01/2020   Lab Results  Component Value Date   TRIG 146.0 03/01/2020   Lab Results  Component Value Date   CHOLHDL 4 03/01/2020   No results found for: HGBA1C     Assessment & Plan:   Problem List Items Addressed This Visit   None  1. Acute pharyngitis due to other specified organisms 2. Other fatigue -Rapid Strep A neg -Will send strep culture -COVID PCR performed in office -Consider blood work and mono test if worse or no improvement and other results are negative -Symptomatic tx at this time including fluids, throat lozenges, nasal saline  3. Acute vaginitis -Cervicovaginal swab sent to test for BV, G/C, Trich, and Yeast -Treat according to results    No orders of the defined types were placed in this encounter.    Hiro Vipond M Shanice Poznanski, PA-C

## 2020-08-31 LAB — SARS-COV-2, NAA 2 DAY TAT

## 2020-08-31 LAB — NOVEL CORONAVIRUS, NAA: SARS-CoV-2, NAA: NOT DETECTED

## 2020-09-01 LAB — CERVICOVAGINAL ANCILLARY ONLY
Bacterial Vaginitis (gardnerella): NEGATIVE
Candida Glabrata: NEGATIVE
Candida Vaginitis: POSITIVE — AB
Chlamydia: NEGATIVE
Comment: NEGATIVE
Comment: NEGATIVE
Comment: NEGATIVE
Comment: NEGATIVE
Comment: NEGATIVE
Comment: NORMAL
Neisseria Gonorrhea: NEGATIVE
Trichomonas: NEGATIVE

## 2020-09-01 LAB — CULTURE, GROUP A STREP

## 2020-09-01 LAB — TIQ-NTM

## 2020-09-05 ENCOUNTER — Other Ambulatory Visit: Payer: Self-pay

## 2020-09-05 MED ORDER — FLUCONAZOLE 150 MG PO TABS: 150.0000 mg | ORAL_TABLET | Freq: Once | ORAL | 0 refills | Status: AC

## 2020-09-25 ENCOUNTER — Encounter: Payer: Self-pay | Admitting: Physician Assistant

## 2020-10-25 ENCOUNTER — Other Ambulatory Visit: Payer: Self-pay

## 2020-10-25 MED ORDER — ONDANSETRON HCL 4 MG PO TABS: 4.0000 mg | ORAL_TABLET | Freq: Three times a day (TID) | ORAL | 1 refills | Status: AC | PRN

## 2020-12-08 ENCOUNTER — Encounter: Payer: 59 | Admitting: Physician Assistant

## 2021-01-11 ENCOUNTER — Encounter: Payer: Self-pay | Admitting: Physician Assistant

## 2021-01-11 ENCOUNTER — Other Ambulatory Visit: Payer: Self-pay

## 2021-01-11 ENCOUNTER — Ambulatory Visit: Payer: 59 | Admitting: Physician Assistant

## 2021-01-11 VITALS — BP 106/71 | HR 83 | Temp 97.2°F | Ht 66.0 in | Wt 224.2 lb

## 2021-01-11 DIAGNOSIS — E559 Vitamin D deficiency, unspecified: Secondary | ICD-10-CM | POA: Diagnosis not present

## 2021-01-11 DIAGNOSIS — Z0000 Encounter for general adult medical examination without abnormal findings: Secondary | ICD-10-CM

## 2021-01-11 DIAGNOSIS — Z8742 Personal history of other diseases of the female genital tract: Secondary | ICD-10-CM | POA: Diagnosis not present

## 2021-01-11 DIAGNOSIS — Z131 Encounter for screening for diabetes mellitus: Secondary | ICD-10-CM | POA: Diagnosis not present

## 2021-01-11 DIAGNOSIS — Z13220 Encounter for screening for lipoid disorders: Secondary | ICD-10-CM

## 2021-01-11 DIAGNOSIS — J309 Allergic rhinitis, unspecified: Secondary | ICD-10-CM

## 2021-01-11 DIAGNOSIS — R0982 Postnasal drip: Secondary | ICD-10-CM

## 2021-01-11 LAB — CBC WITH DIFFERENTIAL/PLATELET
Basophils Absolute: 0.1 10*3/uL (ref 0.0–0.1)
Basophils Relative: 1 % (ref 0.0–3.0)
Eosinophils Absolute: 0.2 10*3/uL (ref 0.0–0.7)
Eosinophils Relative: 2.3 % (ref 0.0–5.0)
HCT: 38.2 % (ref 36.0–46.0)
Hemoglobin: 13.2 g/dL (ref 12.0–15.0)
Lymphocytes Relative: 28.7 % (ref 12.0–46.0)
Lymphs Abs: 2.4 10*3/uL (ref 0.7–4.0)
MCHC: 34.5 g/dL (ref 30.0–36.0)
MCV: 82.6 fl (ref 78.0–100.0)
Monocytes Absolute: 0.6 10*3/uL (ref 0.1–1.0)
Monocytes Relative: 7.3 % (ref 3.0–12.0)
Neutro Abs: 5.2 10*3/uL (ref 1.4–7.7)
Neutrophils Relative %: 60.7 % (ref 43.0–77.0)
Platelets: 228 10*3/uL (ref 150.0–400.0)
RBC: 4.63 Mil/uL (ref 3.87–5.11)
RDW: 14.1 % (ref 11.5–15.5)
WBC: 8.5 10*3/uL (ref 4.0–10.5)

## 2021-01-11 LAB — LIPID PANEL
Cholesterol: 211 mg/dL — ABNORMAL HIGH (ref 0–200)
HDL: 43.6 mg/dL (ref >39.00)
LDL Cholesterol: 135 mg/dL — ABNORMAL HIGH (ref 0–99)
NonHDL: 167.06
Total CHOL/HDL Ratio: 5
Triglycerides: 162 mg/dL — ABNORMAL HIGH (ref 0.0–149.0)
VLDL: 32.4 mg/dL (ref 0.0–40.0)

## 2021-01-11 LAB — COMPREHENSIVE METABOLIC PANEL
ALT: 33 U/L (ref 0–35)
AST: 14 U/L (ref 0–37)
Albumin: 4.1 g/dL (ref 3.5–5.2)
Alkaline Phosphatase: 83 U/L (ref 39–117)
BUN: 12 mg/dL (ref 6–23)
CO2: 26 mEq/L (ref 19–32)
Calcium: 9.1 mg/dL (ref 8.4–10.5)
Chloride: 106 mEq/L (ref 96–112)
Creatinine, Ser: 0.77 mg/dL (ref 0.40–1.20)
GFR: 99.45 mL/min (ref >60.00)
Glucose, Bld: 96 mg/dL (ref 70–99)
Potassium: 4.2 mEq/L (ref 3.5–5.1)
Sodium: 139 mEq/L (ref 135–145)
Total Bilirubin: 0.8 mg/dL (ref 0.2–1.2)
Total Protein: 7.3 g/dL (ref 6.0–8.3)

## 2021-01-11 LAB — TSH: TSH: 4.87 u[IU]/mL (ref 0.35–5.50)

## 2021-01-11 LAB — VITAMIN D 25 HYDROXY (VIT D DEFICIENCY, FRACTURES): VITD: 26.18 ng/mL — ABNORMAL LOW (ref 30.00–100.00)

## 2021-01-11 LAB — HEMOGLOBIN A1C: Hgb A1c MFr Bld: 4.8 % (ref 4.6–6.5)

## 2021-01-11 MED ORDER — MONTELUKAST SODIUM 10 MG PO TABS: 10.0000 mg | ORAL_TABLET | Freq: Every day | ORAL | 2 refills | Status: DC

## 2021-01-11 NOTE — Patient Instructions (Signed)
Good to see you today! Please go to the lab for blood work and I will send results through MyChart. Work on the things we talked about - set small goals - you can do this!  Try Singulair for postnasal drip. Send a MyChart update in the next few months.  See you back in 1 year for next CPE. Sooner if any concerns!

## 2021-01-11 NOTE — Progress Notes (Signed)
Subjective:    Patient ID: Kristen Hoover, female    DOB: 1985/02/03, 36 y.o.   MRN: [id number]  Chief Complaint  Patient presents with   Annual Exam    HPI Patient is in today for annual exam. Works from home. Married, no kids.   Acute concerns: -Hx of PCOS, Significant family hx of diabetes as well, and would like to have labs checked today.  -Postnasal drip ongoing despite trial Flonase and allergy medication  Health maintenance: Lifestyle/ exercise: Minimal at this time; uses a stand-up desk at home  Nutrition: Too much [employer] / fast food per patient, wants to work on improving nutrition Mental health: Recurrent depression; takes Buspar 15 mg daily, Prozac 20 mg; sees psychiatry - also takes Adderall intermittently Caffeine: Occasionally has a half cup of coffee  Sleep: Doing well  Substance use: Smokes marijuana every night around 5:30 pm Sexual activity: Married, monogamous  Immunizations: UTD Pap: [facility]   Past Medical History:  Diagnosis Date   Frequency of urination    GERD (gastroesophageal reflux disease)    History of pancreatitis    12-28-2018-  last episode 2006   PCOS (polycystic ovarian syndrome)    Renal calculus, right    Urgency of urination    Wears glasses     Past Surgical History:  Procedure Laterality Date   CYSTOSCOPY WITH RETROGRADE PYELOGRAM, URETEROSCOPY AND STENT PLACEMENT Right 01/05/2019   Procedure: CYSTOSCOPY/URETEROSCOP/STENT PLACEMENT;  Surgeon: Robley Fries, MD;  Location: [facility];  Service: Urology;  Laterality: Right;  69 MINS   CYSTOSCOPY WITH RETROGRADE PYELOGRAM, URETEROSCOPY AND STENT PLACEMENT Right 02/09/2019   Procedure: CYSTOSCOPY WITH RETROGRADE PYELOGRAM, URETEROSCOPY AND STENT PLACEMENT;  Surgeon: Robley Fries, MD;  Location: [facility];  Service: Urology;  Laterality: Right;  1 HR   ESOPHAGOGASTRODUODENOSCOPY  2015   HOLMIUM LASER APPLICATION Right  [id number]   Procedure: HOLMIUM LASER APPLICATION;  Surgeon: Robley Fries, MD;  Location: [facility];  Service: Urology;  Laterality: Right;   TONSILLECTOMY  2014   WISDOM TOOTH EXTRACTION  2018    Family History  Problem Relation Age of Onset   Fibromyalgia Mother    Diabetes Father    Heart failure Father    Diabetes Brother     Social History   Tobacco Use   Smoking status: Never   Smokeless tobacco: Never  Vaping Use   Vaping Use: Never used  Substance Use Topics   Alcohol use: Not Currently   Drug use: Not Currently    Types: Marijuana    Comment: 12-28-2018  per pt last used 5 wks ago     No Known Allergies  Review of Systems NEGATIVE UNLESS OTHERWISE INDICATED IN HPI      Objective:     BP 106/71    Pulse 83    Temp (!) 97.2 F (36.2 C)    Ht 5\' 6"  (1.676 m)    Wt 224 lb 3.2 oz (101.7 kg)    SpO2 99%    BMI 36.19 kg/m   Wt Readings from Last 3 Encounters:  01/11/21 224 lb 3.2 oz (101.7 kg)  08/30/20 227 lb 3.2 oz (103.1 kg)  03/01/20 230 lb 12.8 oz (104.7 kg)    BP Readings from Last 3 Encounters:  01/11/21 106/71  08/30/20 121/83  03/01/20 118/80     Physical Exam Vitals and nursing note reviewed.  Constitutional:      Appearance: Normal  appearance. She is obese. She is not toxic-appearing.  HENT:     Head: Normocephalic and atraumatic.     Right Ear: Tympanic membrane, ear canal and external ear normal.     Left Ear: Tympanic membrane, ear canal and external ear normal.     Nose: Nose normal.     Mouth/Throat:     Mouth: Mucous membranes are moist.  Eyes:     Extraocular Movements: Extraocular movements intact.     Conjunctiva/sclera: Conjunctivae normal.     Pupils: Pupils are equal, round, and reactive to light.  Cardiovascular:     Rate and Rhythm: Normal rate and regular rhythm.     Pulses: Normal pulses.     Heart sounds: Normal heart sounds.  Pulmonary:     Effort: Pulmonary effort is normal.     Breath  sounds: Normal breath sounds.  Abdominal:     General: Abdomen is flat. Bowel sounds are normal.     Palpations: Abdomen is soft.  Musculoskeletal:        General: Normal range of motion.     Cervical back: Normal range of motion and neck supple.  Skin:    General: Skin is warm and dry.  Neurological:     General: No focal deficit present.     Mental Status: She is alert and oriented to person, place, and time.  Psychiatric:        Mood and Affect: Mood normal.        Behavior: Behavior normal.        Thought Content: Thought content normal.        Judgment: Judgment normal.       Assessment & Plan:   Problem List Items Addressed This Visit       Other   History of PCOS   Relevant Orders   Comprehensive metabolic panel   Hemoglobin A1c   Vitamin D deficiency   Relevant Orders   VITAMIN D 25 Hydroxy (Vit-D Deficiency, Fractures)   Other Visit Diagnoses     Annual physical exam    -  Primary   Relevant Orders   CBC with Differential/Platelet   Comprehensive metabolic panel   Lipid panel   TSH   Hemoglobin A1c   VITAMIN D 25 Hydroxy (Vit-D Deficiency, Fractures)   Diabetes mellitus screening       Relevant Orders   Comprehensive metabolic panel   Hemoglobin A1c   Screening for cholesterol level       Relevant Orders   Lipid panel   Allergic rhinitis with postnasal drip            Meds ordered this encounter  Medications   montelukast (SINGULAIR) 10 MG tablet    Sig: Take 1 tablet (10 mg total) by mouth at bedtime.    Dispense:  30 tablet    Refill:  2   Plan: -Age-appropriate screening and counseling performed today. Will check labs and call with results. Preventive measures discussed and printed in AVS for patient. She is going to work on weight loss by increasing exercise and less fast food.  -Trial Singulair for postnasal drip. She is also encouraged to quit smoking marijuana, which could be contributing. -An After Visit Summary was printed and given  to the patient.    Jonnatan Hanners M Charese Abundis, PA-C

## 2021-03-25 IMAGING — CT CT RENAL STONE PROTOCOL
2 of 4 series · 16 of 46 positions shown, 18 images · non-contrast
Comparison: CT Abdomen and Pelvis 03/20/2018.

CLINICAL DATA: 32-year-old female with recurrent right lower
quadrant abdominal pain. Onset of gross hematuria last night.

EXAM:
CT ABDOMEN AND PELVIS WITHOUT CONTRAST
TECHNIQUE: Multidetector CT imaging of the abdomen and pelvis was performed
following the standard protocol without IV contrast.

[Series 3: ap without · axial · non-contrast · 0.89mm/px · z∈[+644,+1074]mm · 13 of 98 slices shown, 15 images]
[im 6/98  soft-tissue]
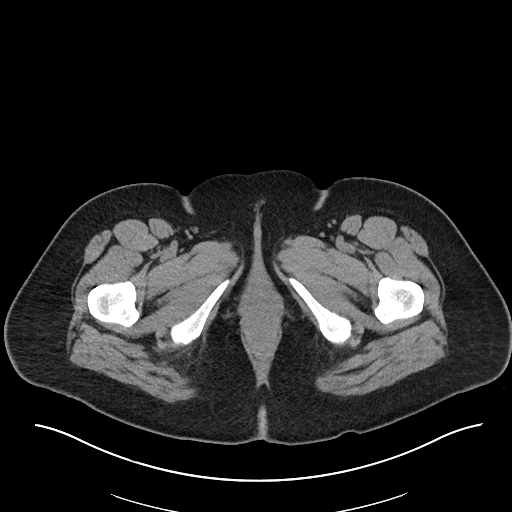
[im 6/98  bone]
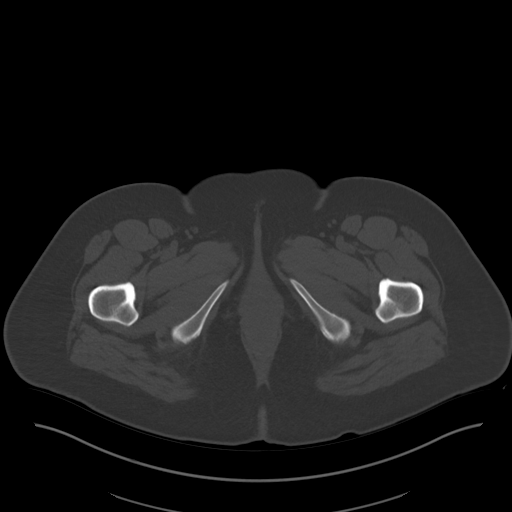
[im 12/98  soft-tissue]
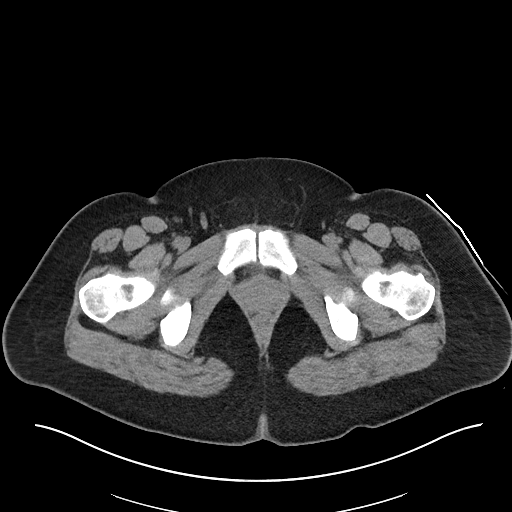
[im 23/98  soft-tissue]
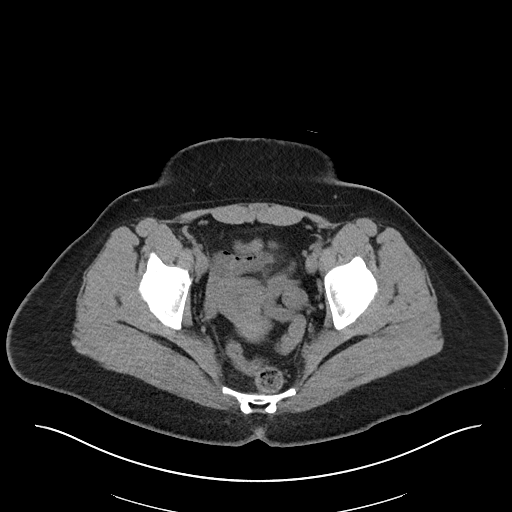
[im 29/98  soft-tissue]
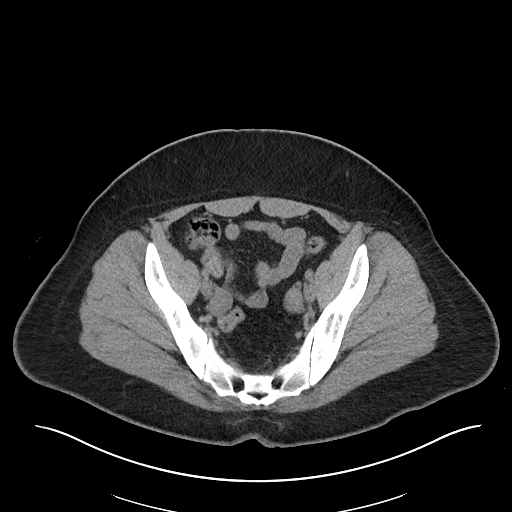
[im 35/98  soft-tissue]
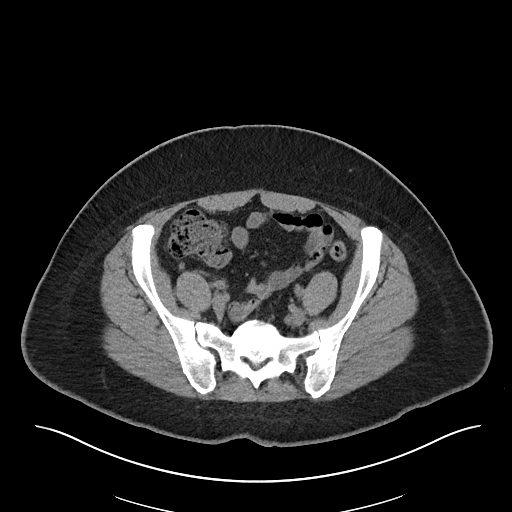
[im 40/98  soft-tissue]
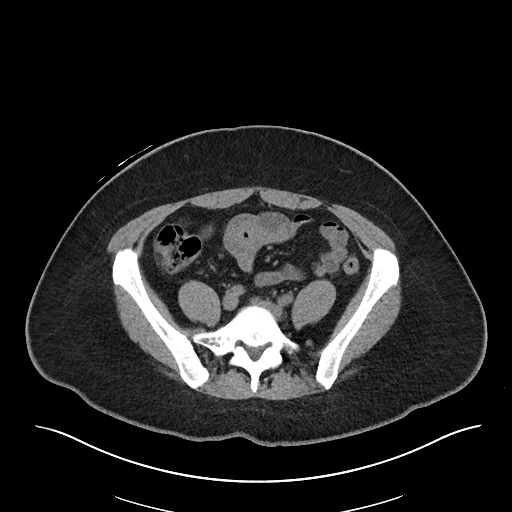
[im 52/98  soft-tissue]
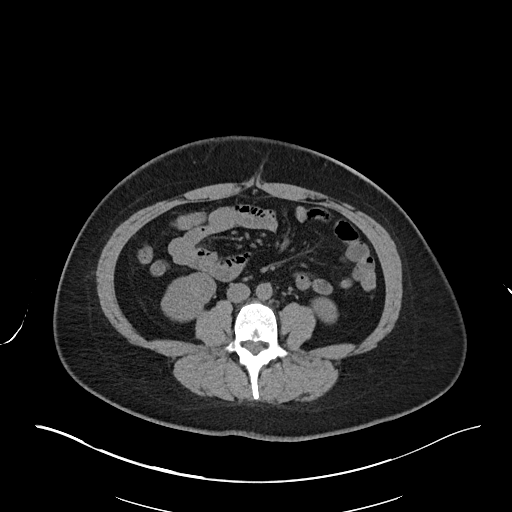
[im 58/98  soft-tissue]
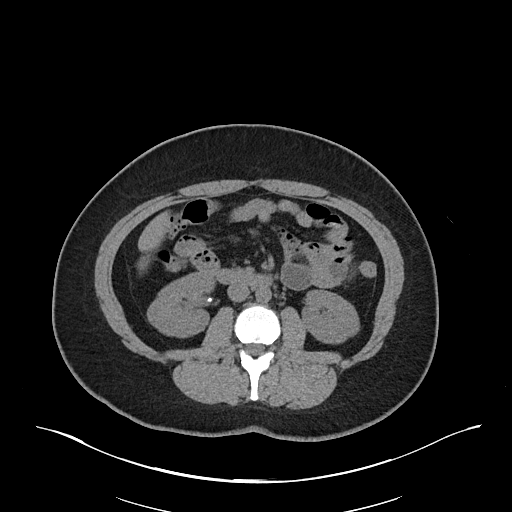
[im 63/98  soft-tissue]
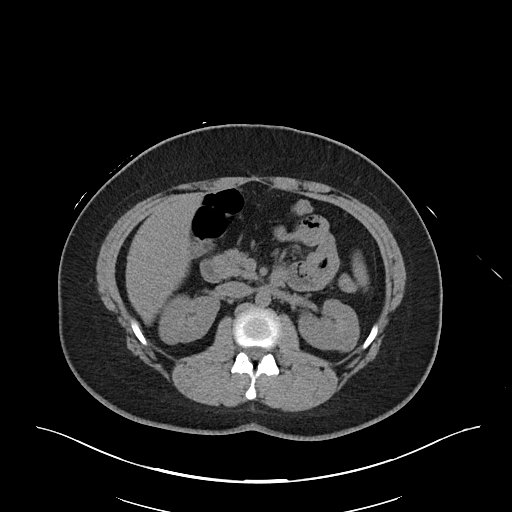
[im 63/98  bone]
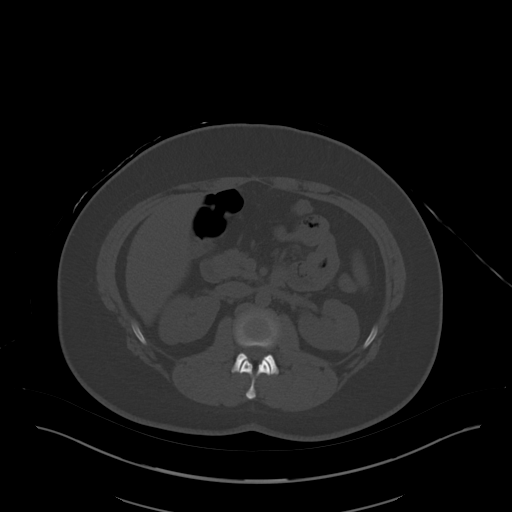
[im 69/98  soft-tissue]
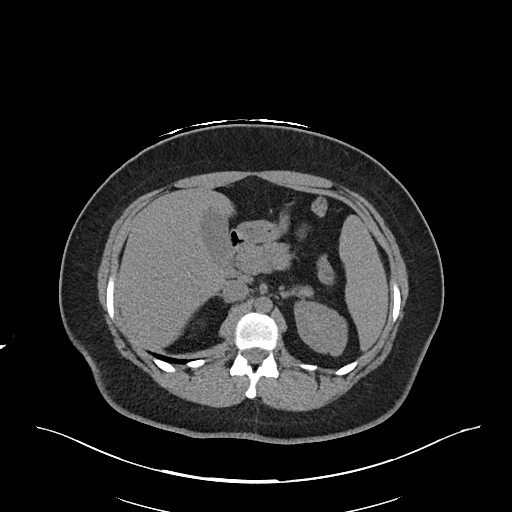
[im 75/98  soft-tissue]
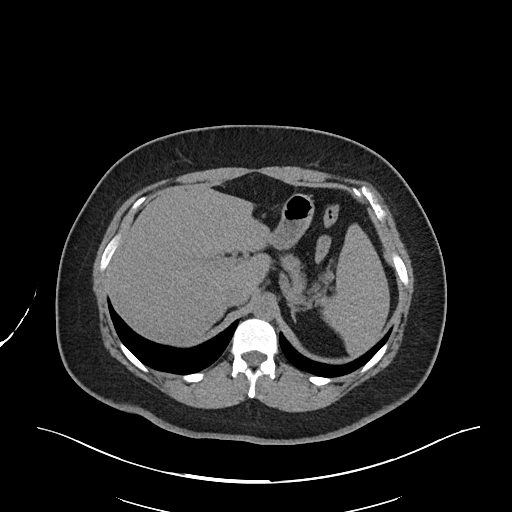
[im 86/98  soft-tissue]
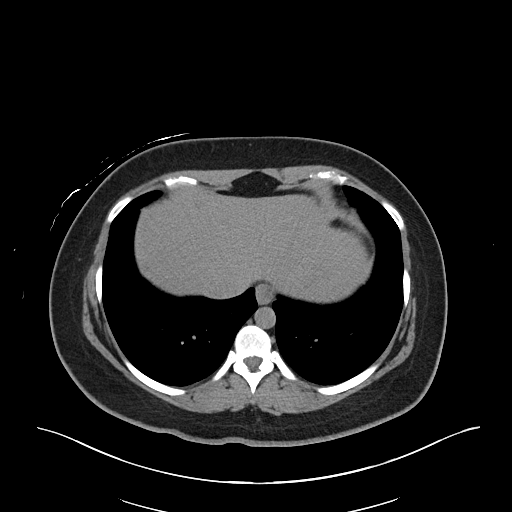
[im 92/98  soft-tissue]
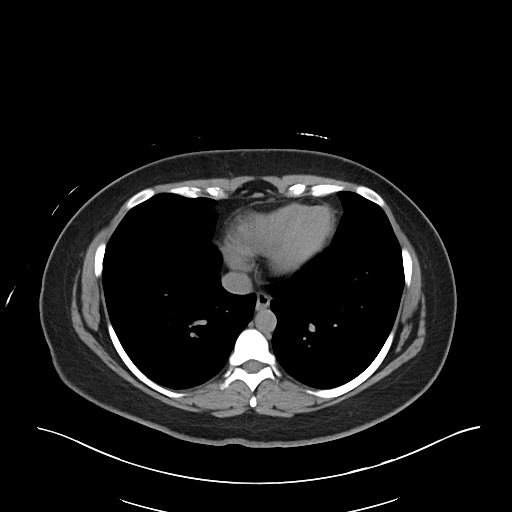

[Series 6: cor · coronal · 0.90mm/px · 3 of 116 slices shown]
[im 39/116  soft-tissue]
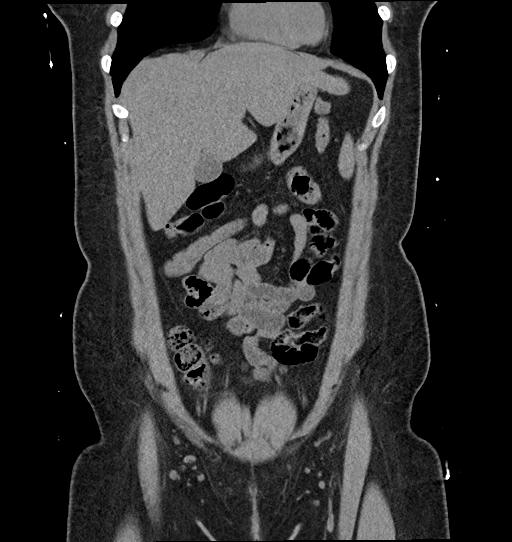
[im 52/116  soft-tissue]
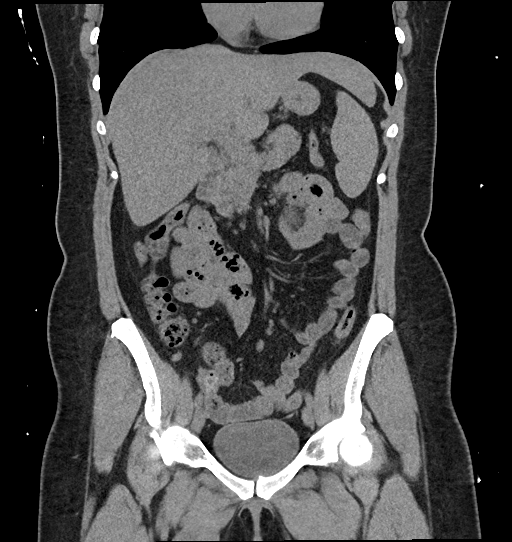
[im 64/116  soft-tissue]
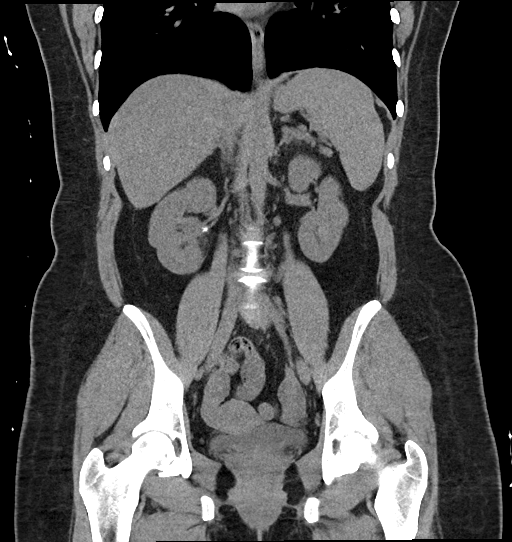

[16 of 46 positions shown; findings below may reference images not displayed]

FINDINGS: Lower chest: Negative.

Hepatobiliary: Negative noncontrast liver and gallbladder.

Pancreas: Negative.

Spleen: Negative.

Adrenals/Urinary Tract: Normal adrenal glands.

Normal non-contrast left kidney and left ureter. Unremarkable
urinary bladder.

Relatively large 6 by 8 millimeter calculus at the right renal hilum
appears to be lodged at the ureteropelvic junction. This calculus
was more centrally located in the renal sinus in [REDACTED]. Minimal
asymmetry of the renal collecting system with no perinephric
stranding. The right ureter remains decompressed.

No other urinary calculus identified.

Stomach/Bowel: Decompressed and negative transverse, descending and
rectosigmoid colon. Mildly redundant flexures. Negative right colon.
Normal retrocecal appendix on series 3, image 64. Negative terminal
ileum. No dilated small bowel. Decompressed stomach. No free air or
free fluid.

Vascular/Lymphatic: Vascular patency is not evaluated in the absence
of IV contrast. No calcified atherosclerosis.

Reproductive: Negative noncontrast appearance.

Other: No pelvic free fluid.

Musculoskeletal: Chronic mid lumbar disc and endplate degeneration
eccentric to the left with associated spinal and lateral recess
stenosis. Right side lumbosacral junction assimilation joint. No
acute osseous abnormality identified.
IMPRESSION: 1. Symptomatic abnormality favored to be a 6 x 8 mm renal calculus
at the right ureteropelvic junction, although without overt
hydronephrosis at this time. There is subtle asymmetric enlargement
of the right renal collecting system. The right ureter remains
decompressed. No other urinary calculus identified.
2. Normal appendix.
3. Chronic lumbar spine degeneration at L3-L4 with spinal and left
lateral recess stenosis.

## 2021-04-10 ENCOUNTER — Other Ambulatory Visit: Payer: Self-pay | Admitting: Physician Assistant

## 2021-05-28 ENCOUNTER — Encounter: Payer: Self-pay | Admitting: Physician Assistant

## 2021-05-29 ENCOUNTER — Encounter: Payer: Self-pay | Admitting: Physician Assistant

## 2021-05-29 ENCOUNTER — Telehealth (INDEPENDENT_AMBULATORY_CARE_PROVIDER_SITE_OTHER): Payer: 59 | Admitting: Physician Assistant

## 2021-05-29 VITALS — Ht 66.0 in | Wt 224.0 lb

## 2021-05-29 DIAGNOSIS — F32A Depression, unspecified: Secondary | ICD-10-CM | POA: Diagnosis not present

## 2021-05-29 DIAGNOSIS — F419 Anxiety disorder, unspecified: Secondary | ICD-10-CM

## 2021-05-29 DIAGNOSIS — F4321 Adjustment disorder with depressed mood: Secondary | ICD-10-CM | POA: Diagnosis not present

## 2021-05-29 MED ORDER — ALPRAZOLAM 0.5 MG PO TABS: ORAL_TABLET | ORAL | 0 refills | Status: DC

## 2021-05-29 NOTE — Progress Notes (Signed)
   Virtual Visit via Video Note  I connected with  Kristen Hoover  on 05/29/21 at 11:45 AM EDT by a video enabled telemedicine application and verified that I am speaking with the correct person using two identifiers.  Location: Patient: home Provider: Nature conservation officer at [employer] Persons present: Patient and myself   I discussed the limitations of evaluation and management by telemedicine and the availability of in person appointments. The patient expressed understanding and agreed to proceed.   History of Present Illness:  Pleasant 36 year old female presents for virtual visit to discuss options for her recent increase in anxiety and panic attacks. Brother deceased in MVA a few weeks ago Very tearful, unable to keep on-task at work Xanax previously when father passed and it helped during that time, only ever filled 1 bottle of #30 and didn't take all of them Takes buspar 15 mg BID and Prozac 20 mg qhs currently Takes Adderall XR 15 mg as needed, hasn't taken during this time Had 2 weeks off, works from home - just started back yesterday Anxious about going into work and getting through the day  Grief counselor appt this week     Observations/Objective:  Gen: Awake, alert, very tearful Resp: Breathing is even and non-labored Psych: calm/pleasant demeanor Neuro: Alert and Oriented x 3, + facial symmetry, speech is clear.   Assessment and Plan:  1. Feeling grief 2. Anxiety and depression -Continue Prozac 20 mg qd and buspar 15 mg BID -Grief counseling this week to start -PDMP reviewed today, no red flags - agreed to send Xanax 0.5 mg #30 to take 1/2 to 1 tab BID as needed for panic / acute anxiety. Pt aware of risks vs benefits and possible adverse reactions. -Pt to keep me posted how she is doing. -F/up in 4-6 weeks or prn    Follow Up Instructions:    I discussed the assessment and treatment plan with the patient. The patient was provided an  opportunity to ask questions and all were answered. The patient agreed with the plan and demonstrated an understanding of the instructions.   The patient was advised to call back or seek an in-person evaluation if the symptoms worsen or if the condition fails to improve as anticipated.  Orest Dygert M Zendaya Groseclose, PA-C

## 2021-05-29 NOTE — Patient Instructions (Signed)
Let me know how you're doing. Call anytime. Thoughts and prayers with you and family.

## 2021-10-01 ENCOUNTER — Encounter: Payer: Self-pay | Admitting: *Deleted

## 2021-11-05 LAB — HM PAP SMEAR: HPV, high-risk: NEGATIVE

## 2021-12-12 ENCOUNTER — Encounter: Payer: Self-pay | Admitting: Physician Assistant

## 2021-12-12 ENCOUNTER — Telehealth (INDEPENDENT_AMBULATORY_CARE_PROVIDER_SITE_OTHER): Payer: 59 | Admitting: Physician Assistant

## 2021-12-12 VITALS — Ht 66.0 in | Wt 224.0 lb

## 2021-12-12 DIAGNOSIS — R112 Nausea with vomiting, unspecified: Secondary | ICD-10-CM

## 2021-12-12 DIAGNOSIS — R519 Headache, unspecified: Secondary | ICD-10-CM

## 2021-12-12 MED ORDER — ONDANSETRON HCL 4 MG PO TABS: 4.0000 mg | ORAL_TABLET | Freq: Three times a day (TID) | ORAL | 0 refills | Status: DC | PRN

## 2021-12-12 MED ORDER — METHYLPREDNISOLONE 4 MG PO TBPK: ORAL_TABLET | ORAL | 0 refills | Status: DC

## 2021-12-12 NOTE — Progress Notes (Signed)
   Virtual Visit via Video Note  I connected with  Kristen Hoover  on 12/12/21 at 11:45 AM EST by a video enabled telemedicine application and verified that I am speaking with the correct person using two identifiers.  Location: Patient: home Provider: Nature conservation officer at [employer] Persons present: Patient and myself   I discussed the limitations of evaluation and management by telemedicine and the availability of in person appointments. The patient expressed understanding and agreed to proceed.   History of Present Illness:  36 yo female patient presents for headache x 1 week. Started around sinuses and left temple area. Pressure type pain.  Nausea / vomiting intermittent throughout the day.  Tylenol not helping Goes to bed and wakes up with it No hx of headaches or migraines  Prism glasses for floaters in eyes / flashes of light, experiencing this more recently as well.   Mom had hx of cluster migraines.  LMP was 12/03/21 -only lasted 2 days, had some spotting the week prior. Husband with hx of vasectomy.    Observations/Objective:   Gen: Awake, alert, no acute distress Resp: Breathing is even and non-labored Psych: calm/pleasant demeanor Neuro: Alert and Oriented x 3, + facial symmetry, speech is clear.   Assessment and Plan:  1. Left-sided headache 2. Nausea and vomiting in adult Sinus / migraine headache features. No red flag symptoms. Plan to start on medrol dose pak and zofran as directed. Double check urine pregnancy test, MyChart me with results. Have vision updated. ED precautions discussed. Recheck in person if worse or no improvement or if recurrent headaches.   Follow Up Instructions:    I discussed the assessment and treatment plan with the patient. The patient was provided an opportunity to ask questions and all were answered. The patient agreed with the plan and demonstrated an understanding of the instructions.   The patient was  advised to call back or seek an in-person evaluation if the symptoms worsen or if the condition fails to improve as anticipated.  Aisa Schoeppner M Shalina Norfolk, PA-C

## 2021-12-20 ENCOUNTER — Telehealth: Payer: 59 | Admitting: Physician Assistant

## 2021-12-20 ENCOUNTER — Encounter: Payer: Self-pay | Admitting: *Deleted

## 2022-01-30 ENCOUNTER — Ambulatory Visit (INDEPENDENT_AMBULATORY_CARE_PROVIDER_SITE_OTHER): Payer: 59 | Admitting: Physician Assistant

## 2022-01-30 ENCOUNTER — Encounter: Payer: Self-pay | Admitting: Physician Assistant

## 2022-01-30 VITALS — BP 106/73 | HR 84 | Temp 98.2°F | Ht 66.0 in | Wt 226.0 lb

## 2022-01-30 DIAGNOSIS — H8112 Benign paroxysmal vertigo, left ear: Secondary | ICD-10-CM

## 2022-01-30 DIAGNOSIS — F341 Dysthymic disorder: Secondary | ICD-10-CM | POA: Diagnosis not present

## 2022-01-30 DIAGNOSIS — R519 Headache, unspecified: Secondary | ICD-10-CM | POA: Diagnosis not present

## 2022-01-30 DIAGNOSIS — H9312 Tinnitus, left ear: Secondary | ICD-10-CM

## 2022-01-30 MED ORDER — MECLIZINE HCL 12.5 MG PO TABS: 12.5000 mg | ORAL_TABLET | Freq: Three times a day (TID) | ORAL | 0 refills | Status: DC | PRN

## 2022-01-30 MED ORDER — AZELASTINE HCL 0.1 % NA SOLN: 1.0000 | Freq: Two times a day (BID) | NASAL | 2 refills | Status: DC

## 2022-01-30 NOTE — Patient Instructions (Addendum)
Please return FMLA paperwork at your convenience and also details of your plans of leave. Happy to help however we can.  If you develop suicidal thoughts, please tell someone and immediately proceed to our local 24/7 crisis center, [facility] at the [facility].  [address], [place], [place]) [phone].   I will try to get an MRI brain w/w/o contrast approved. If sudden severe symptoms, please present to nearest ED. You may take Antivert as needed for vertigo and use Astelin nasal spray. Keep well hydrated.

## 2022-01-30 NOTE — Progress Notes (Signed)
Subjective:    Patient ID: Kristen Hoover, female    DOB: July 25, 1985, 37 y.o.   MRN: [id number]  Chief Complaint  Patient presents with   Follow-up    Pt wants to follow up about headaches and leave of absence from work    Depression    HPI Patient is in today for follow-up on depression and headaches.  Headaches are less frequent recently after taking prednisone course, but still left sided. Since then though, has had vertigo x 4 episodes, spinning episodes, ringing in ears, floaters in eyes. Eye appointment tomorrow. She is worried she might have a tumor.  Depression severely worsening -  Brother passed in May 2023 from [place]. Over the holidays, went into a "dark place" and feeling "numb." Fluoxetine increased to 40 mg about 2 months ago with psychiatrist, not helping. Therapist is recommending time off work, inpatient therapy advised.   Weekly counseling., Feeling very hopeless, but not suicidal. Constant state of discomfort per pt.   Mom in [place], Sister in [place]; lives here with husband and kids, lacking support. Works from home.    Past Medical History:  Diagnosis Date   Frequency of urination    GERD (gastroesophageal reflux disease)    History of pancreatitis    12-28-2018-  last episode 2006   PCOS (polycystic ovarian syndrome)    Renal calculus, right    Urgency of urination    Wears glasses     Past Surgical History:  Procedure Laterality Date   CYSTOSCOPY WITH RETROGRADE PYELOGRAM, URETEROSCOPY AND STENT PLACEMENT Right 01/05/2019   Procedure: CYSTOSCOPY/URETEROSCOP/STENT PLACEMENT;  Surgeon: Robley Fries, MD;  Location: [facility];  Service: Urology;  Laterality: Right;  63 MINS   CYSTOSCOPY WITH RETROGRADE PYELOGRAM, URETEROSCOPY AND STENT PLACEMENT Right 02/09/2019   Procedure: CYSTOSCOPY WITH RETROGRADE PYELOGRAM, URETEROSCOPY AND STENT PLACEMENT;  Surgeon: Robley Fries, MD;  Location: [facility];   Service: Urology;  Laterality: Right;  1 HR   ESOPHAGOGASTRODUODENOSCOPY  2015   HOLMIUM LASER APPLICATION Right 09/09/4266   Procedure: HOLMIUM LASER APPLICATION;  Surgeon: Robley Fries, MD;  Location: [facility];  Service: Urology;  Laterality: Right;   TONSILLECTOMY  2014   WISDOM TOOTH EXTRACTION  2018    Family History  Problem Relation Age of Onset   Fibromyalgia Mother    Diabetes Father    Heart failure Father    Diabetes Brother     Social History   Tobacco Use   Smoking status: Never   Smokeless tobacco: Never  Vaping Use   Vaping Use: Never used  Substance Use Topics   Alcohol use: Not Currently   Drug use: Not Currently    Types: Marijuana    Comment: 12-28-2018  per pt last used 5 wks ago     No Known Allergies  Review of Systems NEGATIVE UNLESS OTHERWISE INDICATED IN HPI      Objective:     BP 106/73 (BP Location: Left Arm, Patient Position: Sitting)   Pulse 84   Temp 98.2 F (36.8 C) (Temporal)   Ht 5\' 6"  (1.676 m)   Wt 226 lb (102.5 kg)   LMP 12/20/2021 (Approximate)   SpO2 98%   BMI 36.48 kg/m   Wt Readings from Last 3 Encounters:  01/30/22 226 lb (102.5 kg)  12/12/21 224 lb (101.6 kg)  05/29/21 224 lb (101.6 kg)    BP Readings from Last 3 Encounters:  01/30/22 106/73  01/11/21 106/71  08/30/20 121/83     Physical Exam Vitals and nursing note reviewed.  Constitutional:      Appearance: Normal appearance. She is normal weight. She is not toxic-appearing.  HENT:     Head: Normocephalic and atraumatic.     Right Ear: Tympanic membrane, ear canal and external ear normal.     Left Ear: Tympanic membrane, ear canal and external ear normal.     Nose: Nose normal.     Mouth/Throat:     Mouth: Mucous membranes are moist.  Eyes:     Extraocular Movements: Extraocular movements intact.     Conjunctiva/sclera: Conjunctivae normal.     Pupils: Pupils are equal, round, and reactive to light.  Cardiovascular:     Rate  and Rhythm: Normal rate and regular rhythm.     Pulses: Normal pulses.     Heart sounds: Normal heart sounds.  Pulmonary:     Effort: Pulmonary effort is normal.     Breath sounds: Normal breath sounds.  Musculoskeletal:        General: Normal range of motion.     Cervical back: Normal range of motion and neck supple.     Right lower leg: No edema.     Left lower leg: No edema.  Skin:    General: Skin is warm and dry.  Neurological:     General: No focal deficit present.     Mental Status: She is alert and oriented to person, place, and time. Mental status is at baseline.     Cranial Nerves: No cranial nerve deficit.     Sensory: No sensory deficit.     Motor: No weakness.     Coordination: Coordination normal.     Gait: Gait normal.  Psychiatric:        Mood and Affect: Mood is depressed. Affect is tearful.        Behavior: Behavior is not agitated.        Thought Content: Thought content is not paranoid. Thought content does not include homicidal or suicidal ideation.        Assessment & Plan:  Persistent depressive disorder  Left-sided headache -     MR BRAIN W WO CONTRAST; Future  Left-sided tinnitus -     MR BRAIN W WO CONTRAST; Future  Vertigo, benign paroxysmal, left -     MR BRAIN W WO CONTRAST; Future  Other orders -     Azelastine HCl; Place 1 spray into both nostrils 2 (two) times daily. Use in each nostril as directed  Dispense: 30 mL; Refill: 2 -     Meclizine HCl; Take 1 tablet (12.5 mg total) by mouth 3 (three) times daily as needed for dizziness.  Dispense: 20 tablet; Refill: 0   Left sided headache --persisting since beginning of Dec 2023. Now with tinnitus and vertigo. No focal neuro deficits. Given duration of headache and new features though, will try to obtain MRI imaging at this time. In meantime - may try Astelin nasal spray and meclizine for added relief. ED precautions discussed with patient.   Depression -- severe, worsening, long-time  therapist advising inpatient treatment. Patient is looking into facilities and hoping to enter into one next month, will need FMLA paperwork completed at that time. I agree with this plan, given that she's lacking in surrounding support and will need a plan with behavioral health while out of work. She will cont to work with psychiatry. Behavioral health [facility] infor provided.   Flowsheet [employer]  Visit from 01/30/2022 in [place]  PHQ-9 Total Score 17         Return in about 2 months (around 03/31/2022) for recheck .  This note was prepared with assistance of Systems analyst. Occasional wrong-word or sound-a-like substitutions may have occurred due to the inherent limitations of voice recognition software.     Emidio Warrell M Ammie Warrick, PA-C

## 2022-02-05 ENCOUNTER — Encounter: Payer: Self-pay | Admitting: Physician Assistant

## 2022-02-05 ENCOUNTER — Ambulatory Visit: Payer: 59 | Admitting: Physician Assistant

## 2022-02-05 NOTE — Telephone Encounter (Signed)
Type of form received: Medical release form and FMLA form  Additional comments: This needs to be completed by 02/14/22  Received by: Jerilynn Birkenhead Ratchford  Form should be Faxed to: Can be found on paperwork  Form should be mailed to:  N/A  Is patient requesting call for pickup: Yes   Form placed:  In provider's box  Attach charge sheet.   Individual made aware of 3-5 business day turn around (Y/N)? Yes

## 2022-02-08 NOTE — Telephone Encounter (Signed)
Pt FMLA paperwork received and will be completed and returned before 02/14/22

## 2022-02-11 DIAGNOSIS — Z0279 Encounter for issue of other medical certificate: Secondary | ICD-10-CM

## 2022-02-11 NOTE — Telephone Encounter (Signed)
FMLA paperwork completed and faxed; confirmation received and charge sheet taken to front office

## 2022-02-12 ENCOUNTER — Ambulatory Visit
Admission: RE | Admit: 2022-02-12 | Discharge: 2022-02-12 | Disposition: A | Discharge status: Home / Self Care | Payer: 59 | Source: Ambulatory Visit | Attending: Physician Assistant | Admitting: Physician Assistant

## 2022-02-12 DIAGNOSIS — H9312 Tinnitus, left ear: Secondary | ICD-10-CM

## 2022-02-12 DIAGNOSIS — R519 Headache, unspecified: Secondary | ICD-10-CM

## 2022-02-12 DIAGNOSIS — H8112 Benign paroxysmal vertigo, left ear: Secondary | ICD-10-CM

## 2022-02-12 MED ORDER — GADOPICLENOL 0.5 MMOL/ML IV SOLN
10.0000 mL | Freq: Once | INTRAVENOUS | Status: AC | PRN
  Administered 2022-02-12: 10 mL via INTRAVENOUS

## 2022-02-13 ENCOUNTER — Other Ambulatory Visit: Payer: Self-pay | Admitting: Physician Assistant

## 2022-02-13 ENCOUNTER — Telehealth: Payer: Self-pay | Admitting: Physician Assistant

## 2022-02-13 DIAGNOSIS — R9089 Other abnormal findings on diagnostic imaging of central nervous system: Secondary | ICD-10-CM | POA: Insufficient documentation

## 2022-02-13 DIAGNOSIS — H9312 Tinnitus, left ear: Secondary | ICD-10-CM

## 2022-02-13 DIAGNOSIS — R519 Headache, unspecified: Secondary | ICD-10-CM

## 2022-02-13 NOTE — Telephone Encounter (Signed)
Error

## 2022-02-14 ENCOUNTER — Other Ambulatory Visit: Payer: Self-pay

## 2022-02-14 ENCOUNTER — Encounter: Payer: Self-pay | Admitting: Physician Assistant

## 2022-02-14 DIAGNOSIS — R9089 Other abnormal findings on diagnostic imaging of central nervous system: Secondary | ICD-10-CM

## 2022-02-14 DIAGNOSIS — R519 Headache, unspecified: Secondary | ICD-10-CM

## 2022-02-14 NOTE — Progress Notes (Signed)
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  No risk factors. Persistent headache,       Continue ASA 3m daily Workup for stroke: CTA head and neck TTE with bubble study.  May need TEE and cardiac event monitor.   Will hold off for her appointment with cardiology. Labs: Hgb A1c, lipid panel, hypercoagulable panel She will continue to see how headaches improve with cessation of cannabis.   Follow up 4 months.    Subjective:  Kristen KEDROWSKIis a 37year old right-handed female with PCOS, depression, and history of kidney stone who presents for headaches.  History supplemented by her accompanying husband and referring provider's notes.   She began experiencing a persistent daily headache in early December.  She describes a sharp pressure pain behind the left eye, left eyebrow tender to touch. Tylenol helps take the edge off. She also had a couple of episodes of horizontal double vision lasting a few minutes.  She started seeing floaters in her vision.  More recently she sees "an amber shooting star" in her vision.  She saw ophthalmology and was prescribed prism glasses.  She was prescribed a prednisone taper at that time which helped.  However, she started having episodes of vertigo, spinning sensation lasting a couple of minutes associated with ringing in the left ear.   She had an MRI of the brain with and without contrast on 02/12/2022 which was personally reviewed and revealed small subacute infarcts within the right thalamus and right centrum semiovale as well as tiny remote infarct within the left cerebellar hemisphere.  In hindsight, she notes that her left pinky finger feels numb and  that her left cheek feels slightly different to touch.  No slurred speech or unilateral weakness.  She was advised to start ASA 8106mdaily  She denies prior history of migraines or other headaches.  Her brother was killed in a MVC in May 2023.  Over the holidays, she started experiencing increased depression.  She has noted possible palpitations but unsure if it is anxiety.  She is taking medical leave to have adjustments made in treatment for her depression.  Denies family history of stroke at early age.  She does not have stroke risk factors such as heart disease, diabetes, hypertension or cigarette smoking.  However, she has slight hyperlipidemia and smokes cannabis.  She just recently quit smoking cannabis and has already noted improvement in her headaches.  She has appointment with cardiology on 2/29.  Current NSAIDS/analgesics:  Tylenol Current muscle relaxants:  Zofran Current Antihypertensive medications:  none Current Antidepressant medications:  fluoxetine 4072maily Current Anticonvulsant medications:  none Current Antihistamines/Decongestants:  meclizine (not using it) Birth control:  none Other medications:  Buspar 69m63mD   PAST MEDICAL HISTORY: Past Medical History:  Diagnosis Date   Frequency of urination    GERD (gastroesophageal reflux disease)    History of pancreatitis    12-28-2018-  last episode 2006   PCOS (polycystic ovarian syndrome)    Renal calculus, right    Urgency of urination    Wears glasses     PAST SURGICAL HISTORY: Past Surgical History:  Procedure Laterality Date   CYSTOSCOPY WITH RETROGRADE PYELOGRAM, URETEROSCOPY AND STENT PLACEMENT Right 01/05/2019  Procedure: CYSTOSCOPY/URETEROSCOP/STENT PLACEMENT;  Surgeon: Robley Fries, MD;  Location: [facility];  Service: Urology;  Laterality: Right;  35 MINS   CYSTOSCOPY WITH RETROGRADE PYELOGRAM, URETEROSCOPY AND STENT PLACEMENT Right 02/09/2019   Procedure: CYSTOSCOPY WITH  RETROGRADE PYELOGRAM, URETEROSCOPY AND STENT PLACEMENT;  Surgeon: Robley Fries, MD;  Location: [facility];  Service: Urology;  Laterality: Right;  1 HR   ESOPHAGOGASTRODUODENOSCOPY  2015   HOLMIUM LASER APPLICATION Right [id number]   Procedure: HOLMIUM LASER APPLICATION;  Surgeon: Robley Fries, MD;  Location: [facility];  Service: Urology;  Laterality: Right;   TONSILLECTOMY  2014   WISDOM TOOTH EXTRACTION  2018    MEDICATIONS: Current Outpatient Medications on File Prior to Visit  Medication Sig Dispense Refill   ALPRAZolam (XANAX) 0.5 MG tablet Take 1/2 to 1 tab po BID prn for acute panic. 30 tablet 0   amphetamine-dextroamphetamine (ADDERALL XR) 15 MG 24 hr capsule Take 15 mg by mouth every morning.     azelastine (ASTELIN) 0.1 % nasal spray Place 1 spray into both nostrils 2 (two) times daily. Use in each nostril as directed 30 mL 2   busPIRone (BUSPAR) 15 MG tablet Take 15 mg by mouth 2 (two) times daily.     calcium carbonate (TUMS - DOSED IN MG ELEMENTAL CALCIUM) 500 MG chewable tablet Chew 1 tablet by mouth as needed for indigestion or heartburn.     FLUoxetine HCl (PROZAC PO) Take 20 mg by mouth at bedtime.     folic acid (FOLVITE) 0.5 MG tablet Take 0.5 mg by mouth daily.     meclizine (ANTIVERT) 12.5 MG tablet Take 1 tablet (12.5 mg total) by mouth 3 (three) times daily as needed for dizziness. 20 tablet 0   metFORMIN (GLUCOPHAGE-XR) 750 MG 24 hr tablet Take 750 mg by mouth 2 (two) times daily.     methylPREDNISolone (MEDROL DOSEPAK) 4 MG TBPK tablet Please take per packaging instructions. 21 tablet 0   ondansetron (ZOFRAN) 4 MG tablet Take 1 tablet (4 mg total) by mouth every 8 (eight) hours as needed for nausea or vomiting. 20 tablet 0   spironolactone (ALDACTONE) 25 MG tablet Take by mouth.     No current facility-administered medications on file prior to visit.    ALLERGIES: No Known Allergies  FAMILY HISTORY: Family History   Problem Relation Age of Onset   Fibromyalgia Mother    Diabetes Father    Heart failure Father    Diabetes Brother     Objective:  Blood pressure 117/74, pulse 93, height 5' 6"$  (1.676 m), weight 224 lb 3.2 oz (101.7 kg), last menstrual period 12/20/2021, SpO2 97 %. General: No acute distress.  Patient appears well-groomed.   Head:  Normocephalic/atraumatic Eyes:  fundi examined but not visualized Neck: supple, no paraspinal tenderness, full range of motion Back: No paraspinal tenderness Heart: regular rate and rhythm Lungs: Clear to auscultation bilaterally. Vascular: No carotid bruits. Neurological Exam: Mental status: alert and oriented to person, place, and time, speech fluent and not dysarthric, language intact. Cranial nerves: CN I: not tested CN II: pupils equal, round and reactive to light, visual fields intact CN III, IV, VI:  full range of motion, no nystagmus, no ptosis CN V: trace altered sensation to touch of left cheeck. CN VII: upper and lower face symmetric CN VIII: hearing intact CN IX, X: gag intact, uvula midline CN XI: sternocleidomastoid and trapezius muscles intact CN XII: tongue midline Bulk & Tone:  normal, no fasciculations. Motor:  muscle strength 5/5 throughout Sensation:  Pinprick, temperature and vibratory sensation intact. Deep Tendon Reflexes:  2+ throughout,  toes downgoing.   Finger to nose testing:  Without dysmetria.   Heel to shin:  Without dysmetria.   Gait:  Normal station and stride.  Able to tandem walk.  Romberg negative.    Thank you for allowing me to take part in the care of this patient.  Metta Clines, DO  CC: Alyssa Allwardt, PA-C

## 2022-02-15 ENCOUNTER — Ambulatory Visit: Payer: 59 | Admitting: Neurology

## 2022-02-15 ENCOUNTER — Other Ambulatory Visit (INDEPENDENT_AMBULATORY_CARE_PROVIDER_SITE_OTHER): Payer: 59

## 2022-02-15 ENCOUNTER — Encounter: Payer: Self-pay | Admitting: Neurology

## 2022-02-15 VITALS — BP 117/74 | HR 93 | Ht 66.0 in | Wt 224.2 lb

## 2022-02-15 DIAGNOSIS — I639 Cerebral infarction, unspecified: Secondary | ICD-10-CM

## 2022-02-15 DIAGNOSIS — R002 Palpitations: Secondary | ICD-10-CM | POA: Diagnosis not present

## 2022-02-15 DIAGNOSIS — G4452 New daily persistent headache (NDPH): Secondary | ICD-10-CM

## 2022-02-15 LAB — LIPID PANEL
Cholesterol: 217 mg/dL — ABNORMAL HIGH (ref 0–200)
HDL: 38.4 mg/dL — ABNORMAL LOW (ref >39.00)
LDL Cholesterol: 149 mg/dL — ABNORMAL HIGH (ref 0–99)
NonHDL: 178.17
Total CHOL/HDL Ratio: 6
Triglycerides: 145 mg/dL (ref 0.0–149.0)
VLDL: 29 mg/dL (ref 0.0–40.0)

## 2022-02-15 MED ORDER — ATORVASTATIN CALCIUM 40 MG PO TABS: 40.0000 mg | ORAL_TABLET | Freq: Every day | ORAL | 3 refills | Status: DC

## 2022-02-15 NOTE — Patient Instructions (Addendum)
Take aspirin 62m daily Start atorvastatin 424mdaily.  Repeat lipid panel in 4 months (prior to follow up) Check CTA head and neck Check echocardiogram with bubble study Check hypercoagulable workup Would check heart monitor (and transesophageal echo pending results of above echo)  Follow up with cardiology Follow up with me in 4 months.

## 2022-02-16 LAB — SPECIMEN STATUS REPORT

## 2022-02-19 ENCOUNTER — Encounter: Payer: Self-pay | Admitting: Neurology

## 2022-02-19 ENCOUNTER — Other Ambulatory Visit: Payer: Self-pay

## 2022-02-19 DIAGNOSIS — R002 Palpitations: Secondary | ICD-10-CM

## 2022-02-19 DIAGNOSIS — I639 Cerebral infarction, unspecified: Secondary | ICD-10-CM

## 2022-02-19 NOTE — Progress Notes (Signed)
Change location of Echo with bubble study to [facility]. Referral already made by primary for patient to be seen. Appt 03/07/22.

## 2022-02-19 NOTE — Telephone Encounter (Signed)
Please advise if ok to send letter to patient employer adjusting LOA start date

## 2022-02-20 ENCOUNTER — Ambulatory Visit
Admission: RE | Admit: 2022-02-20 | Discharge: 2022-02-20 | Disposition: A | Discharge status: Home / Self Care | Payer: 59 | Source: Ambulatory Visit | Attending: Neurology | Admitting: Neurology

## 2022-02-20 ENCOUNTER — Encounter: Payer: Self-pay | Admitting: Physician Assistant

## 2022-02-20 DIAGNOSIS — I639 Cerebral infarction, unspecified: Secondary | ICD-10-CM

## 2022-02-20 DIAGNOSIS — G4452 New daily persistent headache (NDPH): Secondary | ICD-10-CM

## 2022-02-20 MED ORDER — IOPAMIDOL (ISOVUE-370) INJECTION 76%
75.0000 mL | Freq: Once | INTRAVENOUS | Status: AC | PRN
  Administered 2022-02-20: 75 mL via INTRAVENOUS

## 2022-02-20 NOTE — Telephone Encounter (Signed)
Letter for LOA completed and faxed to employer as requested by patient and provider.

## 2022-02-22 NOTE — Progress Notes (Signed)
Patient advised of her results. °

## 2022-02-27 ENCOUNTER — Telehealth: Payer: Self-pay | Admitting: Physician Assistant

## 2022-02-27 ENCOUNTER — Ambulatory Visit: Payer: 59 | Admitting: Neurology

## 2022-02-27 NOTE — Telephone Encounter (Signed)
..  Type of form received: Disability form   Additional comments:   Received by: [employer]   Form should be Faxed to: [phone]  Form should be mailed to:  na   Is patient requesting call for pickup:  No  Form placed:   Alyssa Allwardts fodler  Attach charge sheet.  Yes  Individual made aware of 3-5 business day turn around (Y/N)?  No

## 2022-02-28 LAB — HYPERCOAGULABLE PANEL, COMPREHENSIVE
APTT: 26.7 s
AT III Act/Nor PPP Chro: 99 %
Act. Prt C Resist w/FV Defic.: 2.6 ratio
Anticardiolipin Ab, IgG: 40 [GPL'U] — ABNORMAL HIGH
Anticardiolipin Ab, IgM: <10 [MPL'U]
Beta-2 Glycoprotein I, IgA: <10 SAU
Beta-2 Glycoprotein I, IgG: 77 SGU — ABNORMAL HIGH
Beta-2 Glycoprotein I, IgM: <10 SMU
DRVVT Confirm Seconds: 37 s
DRVVT Ratio: 1.4 ratio — ABNORMAL HIGH
DRVVT Screen Seconds: 57.1 s — ABNORMAL HIGH
Factor VII Antigen**: 102 %
Factor VIII Activity: 136 %
Hexagonal Phospholipid Neutral: 4 s
Homocysteine: 5.7 umol/L
Prot C Ag Act/Nor PPP Imm: 135 %
Prot S Ag Act/Nor PPP Imm: 100 %
Protein C Ag/FVII Ag Ratio**: 1.3 ratio
Protein S Ag/FVII Ag Ratio**: 1 ratio

## 2022-02-28 LAB — HEMOGLOBIN A1C
Est. average glucose Bld gHb Est-mCnc: 97 mg/dL
Hgb A1c MFr Bld: 5 % (ref 4.8–5.6)

## 2022-03-04 ENCOUNTER — Encounter: Payer: Self-pay | Admitting: Physician Assistant

## 2022-03-04 DIAGNOSIS — Z0279 Encounter for issue of other medical certificate: Secondary | ICD-10-CM

## 2022-03-04 NOTE — Telephone Encounter (Signed)
Paperwork completed and faxed per pt request. Charge sheet and original copies taken to front office and given to [place].

## 2022-03-05 ENCOUNTER — Telehealth: Payer: Self-pay

## 2022-03-05 DIAGNOSIS — D6859 Other primary thrombophilia: Secondary | ICD-10-CM

## 2022-03-05 NOTE — Telephone Encounter (Signed)
-----   Message from Pieter Partridge, DO sent at 03/05/2022 10:02 AM EST ----- The hypercoagulable panel is back (which looks for factors that may cause increased risk for blood clots).  There are some abnormalities which I would like evaluated by hematology.  I would like to refer to hematology for stroke and abnormal hypercoagulable panel.  Thank you

## 2022-03-05 NOTE — Progress Notes (Signed)
Tried calling patient, no anwer. LMOVM to call the office.

## 2022-03-05 NOTE — Telephone Encounter (Signed)
Patient advised, Referral made.

## 2022-03-06 ENCOUNTER — Ambulatory Visit: Payer: 59 | Attending: Cardiovascular Disease | Admitting: Cardiovascular Disease

## 2022-03-06 ENCOUNTER — Other Ambulatory Visit: Payer: Self-pay | Admitting: Cardiovascular Disease

## 2022-03-06 ENCOUNTER — Encounter: Payer: Self-pay | Admitting: Cardiovascular Disease

## 2022-03-06 ENCOUNTER — Ambulatory Visit (INDEPENDENT_AMBULATORY_CARE_PROVIDER_SITE_OTHER): Payer: 59

## 2022-03-06 VITALS — BP 118/76 | HR 90 | Ht 66.0 in | Wt 222.6 lb

## 2022-03-06 DIAGNOSIS — I4891 Unspecified atrial fibrillation: Secondary | ICD-10-CM

## 2022-03-06 DIAGNOSIS — I639 Cerebral infarction, unspecified: Secondary | ICD-10-CM

## 2022-03-06 NOTE — Progress Notes (Unsigned)
[id number] cardiac event monitor from office inventory applied to patient.

## 2022-03-06 NOTE — Patient Instructions (Addendum)
Medication Instructions:  Your physician recommends that you continue on your current medications as directed. Please refer to the Current Medication list given to you today.  *If you need a refill on your cardiac medications before your next appointment, please call your pharmacy*   Labs BMET and CBC prior to TEE on 3/15   Testing/Procedures: Your physician has requested that you have a TEE. During a TEE, sound waves are used to create images of your heart. It provides your doctor with information about the size and shape of your heart and how well your heart's chambers and valves are working. In this test, a transducer is attached to the end of a flexible tube that's guided down your throat and into your esophagus (the tube leading from you mouth to your stomach) to get a more detailed image of your heart. You are not awake for the procedure. Please see the instruction sheet given to you today. For further information please visit [email or web address].    Your physician has recommended that you wear an event monitor. Event monitors are medical devices that record the heart's electrical activity. Doctors most often [place] these monitors to diagnose arrhythmias. Arrhythmias are problems with the speed or rhythm of the heartbeat. The monitor is a small, portable device. You can wear one while you do your normal daily activities. This is usually used to diagnose what is causing palpitations/syncope (passing out).    Follow-Up: At [facility], you and your health needs are our priority.  As part of our continuing mission to provide you with exceptional heart care, we have created designated Provider Care Teams.  These Care Teams include your primary Cardiologist (physician) and Advanced Practice Providers (APPs -  Physician Assistants and Nurse Practitioners) who all work together to provide you with the care you need, when you need it.   Your next appointment:   3 month(s)  Provider:    Dr. Acie Fredrickson  Other Instructions    Dear Sherlyn Hay  You are scheduled for a TEE (Transesophageal Echocardiogram) on Friday, March 15 with Dr. Stanford Breed .  Please arrive at the [facility] (Main Entrance A) at [facility]: [address] [place], [place] [address] at 10:00 AM.   DIET:  Nothing to eat or drink after midnight except a sip of water with medications (see medication instructions below)  MEDICATION INSTRUCTIONS:   LABS:  BMET and CBC    Come to the lab at [facility] at [address] between the hours of 8:00 am and 4:30 pm. You do NOT have to be fasting.  FYI:  For your safety, and to allow [place] to monitor your vital signs accurately during the surgery/procedure we request: If you have artificial nails, gel coating, SNS etc, please have those removed prior to your surgery/procedure. Not having the nail coverings /polish removed may result in cancellation or delay of your surgery/procedure.  You must have a responsible person to drive you home and stay in the waiting area during your procedure. Failure to do so could result in cancellation.  Bring your insurance cards.  *Special Note: Every effort is made to have your procedure done on time. Occasionally there are emergencies that occur at the hospital that may cause delays. Please be patient if a delay does occur.

## 2022-03-06 NOTE — Progress Notes (Signed)
Cardiology Office Note:    Date:  03/06/2022   ID:  Kristen Hoover, DOB [birth year], MRN [id number]  PCP:  Allwardt, Randa Evens, PA-C   [facility] Cardiologist: Chaya Dehaan    Referring MD: Fredirick Lathe, PA-C   Chief Complaint  Patient presents with   Cerebrovascular Accident     History of Present Illness:    Kristen Hoover is a 37 y.o. female with a hx of stroke, HLD , PCOS  Seen with husband Kristen Hoover    Had a migraine HA in Dec.   It lasted for 2-3 months Nausea, double vision,  CT shows several infarcts  Is going to see hematology .   Has stopped smoking cannabis ( smoked daily until her  stroke )   No hx of blood clots previously  No current neurological deficits Hx of PCOS   Was sedentary until recently ,  now is walking about a mile a day     Past Medical History:  Diagnosis Date   Anxiety and depression    Frequency of urination    GERD (gastroesophageal reflux disease)    History of pancreatitis    12-28-2018-  last episode 2006   PCOS (polycystic ovarian syndrome)    Renal calculus, right    Urgency of urination    Wears glasses     Past Surgical History:  Procedure Laterality Date   CYSTOSCOPY WITH RETROGRADE PYELOGRAM, URETEROSCOPY AND STENT PLACEMENT Right 01/05/2019   Procedure: CYSTOSCOPY/URETEROSCOP/STENT PLACEMENT;  Surgeon: Robley Fries, MD;  Location: [facility];  Service: Urology;  Laterality: Right;  75 MINS   CYSTOSCOPY WITH RETROGRADE PYELOGRAM, URETEROSCOPY AND STENT PLACEMENT Right 02/09/2019   Procedure: CYSTOSCOPY WITH RETROGRADE PYELOGRAM, URETEROSCOPY AND STENT PLACEMENT;  Surgeon: Robley Fries, MD;  Location: [facility];  Service: Urology;  Laterality: Right;  1 HR   ESOPHAGOGASTRODUODENOSCOPY  2015   HOLMIUM LASER APPLICATION Right [id number]   Procedure: HOLMIUM LASER APPLICATION;  Surgeon: Robley Fries, MD;  Location: [facility];   Service: Urology;  Laterality: Right;   TONSILLECTOMY  2014   WISDOM TOOTH EXTRACTION  2018    Current Medications: Current Meds  Medication Sig   ALPRAZolam (XANAX) 0.5 MG tablet Take 1/2 to 1 tab po BID prn for acute panic.   atorvastatin (LIPITOR) 40 MG tablet Take 1 tablet (40 mg total) by mouth daily.   azelastine (ASTELIN) 0.1 % nasal spray Place 1 spray into both nostrils 2 (two) times daily. Use in each nostril as directed   calcium carbonate (TUMS - DOSED IN MG ELEMENTAL CALCIUM) 500 MG chewable tablet Chew 1 tablet by mouth as needed for indigestion or heartburn.   escitalopram (LEXAPRO) 10 MG tablet Take 10 mg by mouth daily.   FLUoxetine HCl (PROZAC PO) Take 20 mg by mouth at bedtime.   folic acid (FOLVITE) 0.5 MG tablet Take 0.5 mg by mouth daily.   meclizine (ANTIVERT) 12.5 MG tablet Take 1 tablet (12.5 mg total) by mouth 3 (three) times daily as needed for dizziness.   metFORMIN (GLUCOPHAGE-XR) 750 MG 24 hr tablet Take 750 mg by mouth 2 (two) times daily.   omega-3 acid ethyl esters (LOVAZA) 1 g capsule Take by mouth 2 (two) times daily.   ondansetron (ZOFRAN) 4 MG tablet Take 1 tablet (4 mg total) by mouth every 8 (eight) hours as needed for nausea or vomiting.   spironolactone (ALDACTONE) 50 MG tablet Take 50 mg by  mouth daily.     Allergies:   Patient has no known allergies.   Social History   Socioeconomic History   Marital status: Married    Spouse name: Not on file   Number of children: Not on file   Years of education: Not on file   Highest education level: Not on file  Occupational History   Not on file  Tobacco Use   Smoking status: Never   Smokeless tobacco: Never  Vaping Use   Vaping Use: Never used  Substance and Sexual Activity   Alcohol use: Not Currently   Drug use: Not Currently    Types: Marijuana    Comment: 12-28-2018  per pt last used 5 wks ago   Sexual activity: Not on file    Comment: husband had vasectomy  Other Topics Concern   Not  on file  Social History Narrative   Are you right handed or left handed? Right    Are you currently employed ?    What is your current occupation?   Do you live at home alone?   Who lives with you?    What type of home do you live in: 1 story or 2 story? 2       Social Determinants of Radio broadcast assistant Strain: Not on file  Food Insecurity: Not on file  Transportation Needs: Not on file  Physical Activity: Not on file  Stress: Not on file  Social Connections: Not on file     Family History: The patient's family history includes Diabetes in her brother and father; Fibromyalgia in her mother; Headache in her mother; Heart failure in her father; Neuropathy in her father.  ROS:   Please see the history of present illness.     All other systems reviewed and are negative.  EKGs/Labs/Other Studies Reviewed:    The following studies were reviewed today:   EKG: March 06, 2022: Normal sinus rhythm at 90.  No ST or T wave changes.  Recent Labs: No results found for requested labs within last 365 days.  Recent Lipid Panel    Component Value Date/Time   CHOL 217 (H) 02/15/2022 1129   TRIG 145.0 02/15/2022 1129   HDL 38.40 (L) 02/15/2022 1129   CHOLHDL 6 02/15/2022 1129   VLDL 29.0 02/15/2022 1129   LDLCALC 149 (H) 02/15/2022 1129     Risk Assessment/Calculations:                Physical Exam:    VS:  BP 118/76   Pulse 90   Ht '5\' 6"'$  (1.676 m)   Wt 222 lb 9.6 oz (101 kg)   LMP 02/07/2022 (Within Days)   SpO2 99%   BMI 35.93 kg/m     Wt Readings from Last 3 Encounters:  03/06/22 222 lb 9.6 oz (101 kg)  02/15/22 224 lb 3.2 oz (101.7 kg)  01/30/22 226 lb (102.5 kg)     GEN:  moderately obese , pleasant , young female , in no acute distress HEENT: Normal NECK: No JVD; No carotid bruits LYMPHATICS: No lymphadenopathy CARDIAC: RRR, no murmurs, rubs, gallops RESPIRATORY:  Clear to auscultation without rales, wheezing or rhonchi  ABDOMEN: Soft,  non-tender, non-distended MUSCULOSKELETAL:  No edema; No deformity  SKIN: Warm and dry NEUROLOGIC:  Alert and oriented x 3 PSYCHIATRIC:  Normal affect   ASSESSMENT:    1. Cerebrovascular accident (CVA), unspecified mechanism ([place])    PLAN:       Stroke: The  patient presented with headaches, nausea.  She was ultimately found to have had several strokes.  She has been on aspirin since that time.  Given that she has had multiple strokes there is likelihood that she is having embolic strokes.  Will place a 30-day live monitor on her for further evaluation for evidence of atrial fibrillation.  Will schedule her for a transesophageal echo to evaluate her cardiac function, valvular function, left atrial appendage and atrial septum.  She will need a bubble study.  She has PCOS so likely has high baseline estrogen levels.  If she has hypercoagulable defect the combination of this plus high estrogen levels may have contributed to her stroke.  I will see her again in 3 months for follow-up visit.       Shared Decision Making/Informed Consent{   The risks [esophageal damage, perforation (1:10,000 risk), bleeding, pharyngeal hematoma as well as other potential complications associated with conscious sedation including aspiration, arrhythmia, respiratory failure and death], benefits (treatment guidance and diagnostic support) and alternatives of a transesophageal echocardiogram were discussed in detail with Ms. Urbanowicz and she is willing to proceed.     Medication Adjustments/Labs and Tests Ordered: Current medicines are reviewed at length with the patient today.  Concerns regarding medicines are outlined above.  Orders Placed This Encounter  Procedures   CBC   Basic metabolic panel   EKG [id number]   No orders of the defined types were placed in this encounter.   Patient Instructions  Medication Instructions:  Your physician recommends that you continue on your current medications as  directed. Please refer to the Current Medication list given to you today.  *If you need a refill on your cardiac medications before your next appointment, please call your pharmacy*   Labs BMET and CBC prior to TEE on 3/15   Testing/Procedures: Your physician has requested that you have a TEE. During a TEE, sound waves are used to create images of your heart. It provides your doctor with information about the size and shape of your heart and how well your heart's chambers and valves are working. In this test, a transducer is attached to the end of a flexible tube that's guided down your throat and into your esophagus (the tube leading from you mouth to your stomach) to get a more detailed image of your heart. You are not awake for the procedure. Please see the instruction sheet given to you today. For further information please visit [email or web address].    Your physician has recommended that you wear an event monitor. Event monitors are medical devices that record the heart's electrical activity. Doctors most often [place] these monitors to diagnose arrhythmias. Arrhythmias are problems with the speed or rhythm of the heartbeat. The monitor is a small, portable device. You can wear one while you do your normal daily activities. This is usually used to diagnose what is causing palpitations/syncope (passing out).    Follow-Up: At [facility], you and your health needs are our priority.  As part of our continuing mission to provide you with exceptional heart care, we have created designated Provider Care Teams.  These Care Teams include your primary Cardiologist (physician) and Advanced Practice Providers (APPs -  Physician Assistants and Nurse Practitioners) who all work together to provide you with the care you need, when you need it.   Your next appointment:   3 month(s)  Provider:   Dr. Acie Fredrickson  Other Instructions    Dear Kristen Hoover  You are  scheduled for a TEE  (Transesophageal Echocardiogram) on Friday, March 15 with Dr. Stanford Breed .  Please arrive at the [facility] (Main Entrance A) at [facility]: [address] [place], [place] [address] at 10:00 AM.   DIET:  Nothing to eat or drink after midnight except a sip of water with medications (see medication instructions below)  MEDICATION INSTRUCTIONS:   LABS:  BMET and CBC    Come to the lab at [facility] at [address] between the hours of 8:00 am and 4:30 pm. You do NOT have to be fasting.  FYI:  For your safety, and to allow [place] to monitor your vital signs accurately during the surgery/procedure we request: If you have artificial nails, gel coating, SNS etc, please have those removed prior to your surgery/procedure. Not having the nail coverings /polish removed may result in cancellation or delay of your surgery/procedure.  You must have a responsible person to drive you home and stay in the waiting area during your procedure. Failure to do so could result in cancellation.  Bring your insurance cards.  *Special Note: Every effort is made to have your procedure done on time. Occasionally there are emergencies that occur at the hospital that may cause delays. Please be patient if a delay does occur.       Signed, Mertie Moores, MD  03/06/2022 4:18 PM    [place]

## 2022-03-07 ENCOUNTER — Ambulatory Visit: Payer: 59 | Admitting: Cardiology

## 2022-03-14 ENCOUNTER — Encounter: Payer: Self-pay | Admitting: Physician Assistant

## 2022-03-14 ENCOUNTER — Ambulatory Visit: Payer: 59 | Admitting: Physician Assistant

## 2022-03-14 ENCOUNTER — Telehealth: Payer: Self-pay | Admitting: Hematology and Oncology

## 2022-03-14 VITALS — BP 112/78 | HR 90 | Temp 97.5°F | Ht 66.0 in | Wt 221.6 lb

## 2022-03-14 DIAGNOSIS — I639 Cerebral infarction, unspecified: Secondary | ICD-10-CM

## 2022-03-14 DIAGNOSIS — K921 Melena: Secondary | ICD-10-CM | POA: Diagnosis not present

## 2022-03-14 DIAGNOSIS — F341 Dysthymic disorder: Secondary | ICD-10-CM | POA: Diagnosis not present

## 2022-03-14 DIAGNOSIS — Z800 Family history of malignant neoplasm of digestive organs: Secondary | ICD-10-CM | POA: Diagnosis not present

## 2022-03-14 DIAGNOSIS — L918 Other hypertrophic disorders of the skin: Secondary | ICD-10-CM

## 2022-03-14 NOTE — H&P (View-Only) (Signed)
 Subjective:    Patient ID: Kristen Hoover, female    DOB: 11/14/1985, 37 y.o.   MRN: [id number]  Chief Complaint  Patient presents with   Follow-up    Pt in office to update PCP on health and taking over Rx management; also discuss  periodically having blood in stool, would like to request colonoscopy.Pt has family hx of colon cancer. Wants to have skin tag removed on abdomen that is bothering her; referral to Endocrinology     HPI Patient is in today for overall follow-up.  She has had appointments with cardiology and neurology since her last visit with me after she was found to have several strokes on MRI.  She is currently wearing a 30-day monitor to evaluate for possible evidence of A-fib.  She is also being scheduled for a echocardiogram and a bubble study.  Quit smoking marijuana - migraines have improved greatly and feeling better overall.  She is also following up with hematologist the first week in April.  In regard to her depression, she feels like she is starting to do better.  3rd week with [place] outpatient program- doing very good, considering extending her time. Now doing 1/2 day program instead of full-day program. Currently transitioning from prozac to lexapro. She was on Prozac 40 mg for the last 2 years; 20 mg dose for the 6 years prior. Noticing decreased appetite & libido.   New concern today is that she is starting to have blood in stool on toilet paper after wiping sometimes.  Her paternal grandfather passed from colon cancer and patient would like to be on the safe side and go ahead and see GI and possibly have a colonoscopy just to make sure everything is okay.   Denies any abnormal weight loss, night sweats, dizziness, lightheadedness.  No chest pain or shortness of breath.  She also has several skin tags she would like removed today.  Past Medical History:  Diagnosis Date   Anxiety and depression    Frequency of urination    GERD (gastroesophageal  reflux disease)    History of pancreatitis    12-28-2018-  last episode 2006   PCOS (polycystic ovarian syndrome)    Renal calculus, right    Urgency of urination    Wears glasses     Past Surgical History:  Procedure Laterality Date   CYSTOSCOPY WITH RETROGRADE PYELOGRAM, URETEROSCOPY AND STENT PLACEMENT Right 01/05/2019   Procedure: CYSTOSCOPY/URETEROSCOP/STENT PLACEMENT;  Surgeon: Pace, Maryellen D, MD;  Location: [facility];  Service: Urology;  Laterality: Right;  75 MINS   CYSTOSCOPY WITH RETROGRADE PYELOGRAM, URETEROSCOPY AND STENT PLACEMENT Right 02/09/2019   Procedure: CYSTOSCOPY WITH RETROGRADE PYELOGRAM, URETEROSCOPY AND STENT PLACEMENT;  Surgeon: Pace, Maryellen D, MD;  Location: [facility];  Service: Urology;  Laterality: Right;  1 HR   ESOPHAGOGASTRODUODENOSCOPY  2015   HOLMIUM LASER APPLICATION Right 02/09/2019   Procedure: HOLMIUM LASER APPLICATION;  Surgeon: Pace, Maryellen D, MD;  Location: [facility];  Service: Urology;  Laterality: Right;   TONSILLECTOMY  2014   WISDOM TOOTH EXTRACTION  2018    Family History  Problem Relation Age of Onset   Fibromyalgia Mother    Headache Mother    Neuropathy Father    Diabetes Father    Heart failure Father    Diabetes Brother    Colon cancer Paternal Grandfather     Social History   Tobacco Use   Smoking status: Never   Smokeless tobacco:   Never  Vaping Use   Vaping Use: Never used  Substance Use Topics   Alcohol use: Not Currently   Drug use: Not Currently    Types: Marijuana    Comment: 12-28-2018  per pt last used 5 wks ago     No Known Allergies  Review of Systems NEGATIVE UNLESS OTHERWISE INDICATED IN HPI      Objective:     BP 112/78 (BP Location: Left Arm)   Pulse 90   Temp (!) 97.5 F (36.4 C) (Temporal)   Ht 5' 6" (1.676 m)   Wt 221 lb 9.6 oz (100.5 kg)   LMP 03/12/2022 (Exact Date)   SpO2 99%   BMI 35.77 kg/m   Wt Readings from Last 3  Encounters:  03/14/22 221 lb 9.6 oz (100.5 kg)  03/06/22 222 lb 9.6 oz (101 kg)  02/15/22 224 lb 3.2 oz (101.7 kg)    BP Readings from Last 3 Encounters:  03/14/22 112/78  03/06/22 118/76  02/15/22 117/74     Physical Exam Vitals and nursing note reviewed.  Constitutional:      Appearance: Normal appearance.  Eyes:     Extraocular Movements: Extraocular movements intact.     Conjunctiva/sclera: Conjunctivae normal.     Pupils: Pupils are equal, round, and reactive to light.  Cardiovascular:     Rate and Rhythm: Normal rate and regular rhythm.     Pulses: Normal pulses.  Pulmonary:     Effort: Pulmonary effort is normal.     Breath sounds: Normal breath sounds.  Skin:    Comments: 4 small skin tags < 2 mm - one on center torso; one on R low mid-back; two R axilla.   Neurological:     General: No focal deficit present.     Mental Status: She is alert and oriented to person, place, and time.  Psychiatric:        Mood and Affect: Mood normal.        Behavior: Behavior normal.        Assessment & Plan:  Cerebrovascular accident (CVA), unspecified mechanism ([facility]) Assessment & Plan: Abnormal brain MRI She has had follow-up with neurology and cardiology Question prior cannabis use as underlying etiology, further workup still in progress at this time. She will continue on daily aspirin.  She has follow-up with hematology coming up as well.  Headaches are improving.    Persistent depressive disorder Assessment & Plan: Currently following with [place] outpatient [facility].  Transitioning off Prozac and onto Lexapro.  Suggested that she may need psychiatry follow-up outpatient for most efficient long-term medication treatment plan.  Patient will let me know what she decides and how she is doing.   Blood in stool -     Ambulatory referral to Gastroenterology  Family history of colon cancer -     Ambulatory referral to Gastroenterology  Skin tags, multiple  acquired   Skin tags as noted in physical exam:  Procedure explained, consent obtained. Cryotherapy performed on four lesions. Two freeze-thaw cycles with ice ring formation and deformation in between each cycle. Pt tolerated procedure well. Aftercare instructions provided.     Return in about 4 weeks (around 04/11/2022) for med check .  This note was prepared with assistance of Dragon voice recognition software. Occasional wrong-word or sound-a-like substitutions may have occurred due to the inherent limitations of voice recognition software.     Aileene Lanum M Erskin Zinda, PA-C 

## 2022-03-14 NOTE — Telephone Encounter (Signed)
Scheduled appt per 2/27 referral. Called pt, no answer. Left msg with appt date/time. Requested for pt to call back to confirm.

## 2022-03-14 NOTE — Progress Notes (Signed)
Subjective:    Patient ID: Kristen Hoover, female    DOB: Jun 14, 1985, 37 y.o.   MRN: [id number]  Chief Complaint  Patient presents with   Follow-up    Pt in office to update PCP on health and taking over Rx management; also discuss  periodically having blood in stool, would like to request colonoscopy.Pt has family hx of colon cancer. Wants to have skin tag removed on abdomen that is bothering her; referral to Endocrinology     HPI Patient is in today for overall follow-up.  She has had appointments with cardiology and neurology since her last visit with me after she was found to have several strokes on MRI.  She is currently wearing a 30-day monitor to evaluate for possible evidence of A-fib.  She is also being scheduled for a echocardiogram and a bubble study.  Quit smoking marijuana - migraines have improved greatly and feeling better overall.  She is also following up with hematologist the first week in April.  In regard to her depression, she feels like she is starting to do better.  3rd week with [facility] outpatient program- doing very good, considering extending her time. Now doing 1/2 day program instead of full-day program. Currently transitioning from prozac to lexapro. She was on Prozac 40 mg for the last 2 years; 20 mg dose for the 6 years prior. Noticing decreased appetite & libido.   New concern today is that she is starting to have blood in stool on toilet paper after wiping sometimes.  Her paternal grandfather passed from colon cancer and patient would like to be on the safe side and go ahead and see GI and possibly have a colonoscopy just to make sure everything is okay.   Denies any abnormal weight loss, night sweats, dizziness, lightheadedness.  No chest pain or shortness of breath.  She also has several skin tags she would like removed today.  Past Medical History:  Diagnosis Date   Anxiety and depression    Frequency of urination    GERD (gastroesophageal  reflux disease)    History of pancreatitis    12-28-2018-  last episode 2006   PCOS (polycystic ovarian syndrome)    Renal calculus, right    Urgency of urination    Wears glasses     Past Surgical History:  Procedure Laterality Date   CYSTOSCOPY WITH RETROGRADE PYELOGRAM, URETEROSCOPY AND STENT PLACEMENT Right 01/05/2019   Procedure: CYSTOSCOPY/URETEROSCOP/STENT PLACEMENT;  Surgeon: Robley Fries, MD;  Location: [facility];  Service: Urology;  Laterality: Right;  58 MINS   CYSTOSCOPY WITH RETROGRADE PYELOGRAM, URETEROSCOPY AND STENT PLACEMENT Right 02/09/2019   Procedure: CYSTOSCOPY WITH RETROGRADE PYELOGRAM, URETEROSCOPY AND STENT PLACEMENT;  Surgeon: Robley Fries, MD;  Location: [facility];  Service: Urology;  Laterality: Right;  1 HR   ESOPHAGOGASTRODUODENOSCOPY  2015   HOLMIUM LASER APPLICATION Right 07/12/8113   Procedure: HOLMIUM LASER APPLICATION;  Surgeon: Robley Fries, MD;  Location: [facility];  Service: Urology;  Laterality: Right;   TONSILLECTOMY  2014   WISDOM TOOTH EXTRACTION  2018    Family History  Problem Relation Age of Onset   Fibromyalgia Mother    Headache Mother    Neuropathy Father    Diabetes Father    Heart failure Father    Diabetes Brother    Colon cancer Paternal Grandfather     Social History   Tobacco Use   Smoking status: Never   Smokeless tobacco:  Never  Vaping Use   Vaping Use: Never used  Substance Use Topics   Alcohol use: Not Currently   Drug use: Not Currently    Types: Marijuana    Comment: 12-28-2018  per pt last used 5 wks ago     No Known Allergies  Review of Systems NEGATIVE UNLESS OTHERWISE INDICATED IN HPI      Objective:     BP 112/78 (BP Location: Left Arm)   Pulse 90   Temp (!) 97.5 F (36.4 C) (Temporal)   Ht 5\' 6"  (1.676 m)   Wt 221 lb 9.6 oz (100.5 kg)   LMP 03/12/2022 (Exact Date)   SpO2 99%   BMI 35.77 kg/m   Wt Readings from Last 3  Encounters:  03/14/22 221 lb 9.6 oz (100.5 kg)  03/06/22 222 lb 9.6 oz (101 kg)  02/15/22 224 lb 3.2 oz (101.7 kg)    BP Readings from Last 3 Encounters:  03/14/22 112/78  03/06/22 118/76  02/15/22 117/74     Physical Exam Vitals and nursing note reviewed.  Constitutional:      Appearance: Normal appearance.  Eyes:     Extraocular Movements: Extraocular movements intact.     Conjunctiva/sclera: Conjunctivae normal.     Pupils: Pupils are equal, round, and reactive to light.  Cardiovascular:     Rate and Rhythm: Normal rate and regular rhythm.     Pulses: Normal pulses.  Pulmonary:     Effort: Pulmonary effort is normal.     Breath sounds: Normal breath sounds.  Skin:    Comments: 4 small skin tags < 2 mm - one on center torso; one on R low mid-back; two R axilla.   Neurological:     General: No focal deficit present.     Mental Status: She is alert and oriented to person, place, and time.  Psychiatric:        Mood and Affect: Mood normal.        Behavior: Behavior normal.        Assessment & Plan:  Cerebrovascular accident (CVA), unspecified mechanism ([place]) Assessment & Plan: Abnormal brain MRI She has had follow-up with neurology and cardiology Question prior cannabis use as underlying etiology, further workup still in progress at this time. She will continue on daily aspirin.  She has follow-up with hematology coming up as well.  Headaches are improving.    Persistent depressive disorder Assessment & Plan: Currently following with [facility] outpatient clinic.  Transitioning off Prozac and onto Lexapro.  Suggested that she may need psychiatry follow-up outpatient for most efficient long-term medication treatment plan.  Patient will let me know what she decides and how she is doing.   Blood in stool -     Ambulatory referral to Gastroenterology  Family history of colon cancer -     Ambulatory referral to Gastroenterology  Skin tags, multiple  acquired   Skin tags as noted in physical exam:  Procedure explained, consent obtained. Cryotherapy performed on four lesions. Two freeze-thaw cycles with ice ring formation and deformation in between each cycle. Pt tolerated procedure well. Aftercare instructions provided.     Return in about 4 weeks (around 04/11/2022) for med check .  This note was prepared with assistance of Systems analyst. Occasional wrong-word or sound-a-like substitutions may have occurred due to the inherent limitations of voice recognition software.     Kazumi Lachney M Tayvien Kane, PA-C

## 2022-03-15 DIAGNOSIS — I639 Cerebral infarction, unspecified: Secondary | ICD-10-CM | POA: Insufficient documentation

## 2022-03-15 NOTE — Assessment & Plan Note (Signed)
Abnormal brain MRI She has had follow-up with neurology and cardiology Question prior cannabis use as underlying etiology, further workup still in progress at this time. She will continue on daily aspirin.  She has follow-up with hematology coming up as well.  Headaches are improving.

## 2022-03-15 NOTE — Assessment & Plan Note (Signed)
Currently following with [facility] outpatient clinic.  Transitioning off Prozac and onto Lexapro.  Suggested that she may need psychiatry follow-up outpatient for most efficient long-term medication treatment plan.  Patient will let me know what she decides and how she is doing.

## 2022-03-18 ENCOUNTER — Ambulatory Visit: Payer: 59

## 2022-03-18 ENCOUNTER — Ambulatory Visit (HOSPITAL_COMMUNITY): Payer: 59 | Attending: Neurology

## 2022-03-18 DIAGNOSIS — I639 Cerebral infarction, unspecified: Secondary | ICD-10-CM | POA: Diagnosis present

## 2022-03-18 DIAGNOSIS — R002 Palpitations: Secondary | ICD-10-CM | POA: Insufficient documentation

## 2022-03-18 LAB — ECHOCARDIOGRAM COMPLETE BUBBLE STUDY
Area-P 1/2: 3.23 cm2
MV M vel: 4.12 m/s
MV Peak grad: 67.9 mmHg
S' Lateral: 2.6 cm

## 2022-03-18 MED ORDER — PERFLUTREN LIPID MICROSPHERE
1.0000 mL | INTRAVENOUS | Status: AC | PRN
  Administered 2022-03-18: 2 mL via INTRAVENOUS

## 2022-03-19 LAB — BASIC METABOLIC PANEL
BUN/Creatinine Ratio: 12 (ref 9–23)
BUN: 10 mg/dL (ref 6–20)
CO2: 23 mmol/L (ref 20–29)
Calcium: 9.5 mg/dL (ref 8.7–10.2)
Chloride: 104 mmol/L (ref 96–106)
Creatinine, Ser: 0.84 mg/dL (ref 0.57–1.00)
Glucose: 75 mg/dL (ref 70–99)
Potassium: 4.1 mmol/L (ref 3.5–5.2)
Sodium: 142 mmol/L (ref 134–144)
eGFR: 92 mL/min/{1.73_m2} (ref >59)

## 2022-03-19 LAB — CBC
Hematocrit: 35.8 % (ref 34.0–46.6)
Hemoglobin: 12.6 g/dL (ref 11.1–15.9)
MCH: 29.6 pg (ref 26.6–33.0)
MCHC: 35.2 g/dL (ref 31.5–35.7)
MCV: 84 fL (ref 79–97)
Platelets: 267 10*3/uL (ref 150–450)
RBC: 4.25 x10E6/uL (ref 3.77–5.28)
RDW: 13.1 % (ref 11.7–15.4)
WBC: 11.2 10*3/uL — ABNORMAL HIGH (ref 3.4–10.8)

## 2022-03-22 ENCOUNTER — Ambulatory Visit (HOSPITAL_COMMUNITY): Payer: 59 | Admitting: Anesthesiology

## 2022-03-22 ENCOUNTER — Ambulatory Visit (HOSPITAL_COMMUNITY)
Admission: RE | Admit: 2022-03-22 | Discharge: 2022-03-22 | Disposition: A | Discharge status: Home / Self Care | Payer: 59 | Source: Ambulatory Visit | Attending: Cardiology | Admitting: Cardiology

## 2022-03-22 ENCOUNTER — Ambulatory Visit (HOSPITAL_COMMUNITY): Admission: RE | Admit: 2022-03-22 | Payer: 59 | Source: Home / Self Care | Admitting: Cardiology

## 2022-03-22 ENCOUNTER — Encounter (HOSPITAL_COMMUNITY)
Admission: RE | Disposition: A | Discharge status: Home / Self Care | Payer: Self-pay | Source: Ambulatory Visit | Attending: Cardiology

## 2022-03-22 ENCOUNTER — Ambulatory Visit (HOSPITAL_BASED_OUTPATIENT_CLINIC_OR_DEPARTMENT_OTHER): Payer: 59 | Admitting: Anesthesiology

## 2022-03-22 ENCOUNTER — Ambulatory Visit (HOSPITAL_COMMUNITY)
Admission: RE | Admit: 2022-03-22 | Discharge: 2022-03-22 | Disposition: A | Discharge status: Home / Self Care | Payer: 59 | Source: Ambulatory Visit | Attending: Cardiovascular Disease | Admitting: Cardiovascular Disease

## 2022-03-22 ENCOUNTER — Encounter (HOSPITAL_COMMUNITY): Admission: RE | Payer: Self-pay | Source: Home / Self Care

## 2022-03-22 ENCOUNTER — Encounter (HOSPITAL_COMMUNITY): Payer: Self-pay | Admitting: Cardiology

## 2022-03-22 ENCOUNTER — Other Ambulatory Visit: Payer: Self-pay

## 2022-03-22 DIAGNOSIS — F1211 Cannabis abuse, in remission: Secondary | ICD-10-CM | POA: Insufficient documentation

## 2022-03-22 DIAGNOSIS — E559 Vitamin D deficiency, unspecified: Secondary | ICD-10-CM

## 2022-03-22 DIAGNOSIS — E785 Hyperlipidemia, unspecified: Secondary | ICD-10-CM | POA: Diagnosis not present

## 2022-03-22 DIAGNOSIS — Q2112 Patent foramen ovale: Secondary | ICD-10-CM | POA: Diagnosis not present

## 2022-03-22 DIAGNOSIS — I639 Cerebral infarction, unspecified: Secondary | ICD-10-CM

## 2022-03-22 DIAGNOSIS — F32A Depression, unspecified: Secondary | ICD-10-CM | POA: Insufficient documentation

## 2022-03-22 DIAGNOSIS — Z8673 Personal history of transient ischemic attack (TIA), and cerebral infarction without residual deficits: Secondary | ICD-10-CM | POA: Insufficient documentation

## 2022-03-22 DIAGNOSIS — K921 Melena: Secondary | ICD-10-CM | POA: Insufficient documentation

## 2022-03-22 DIAGNOSIS — R9089 Other abnormal findings on diagnostic imaging of central nervous system: Secondary | ICD-10-CM | POA: Insufficient documentation

## 2022-03-22 DIAGNOSIS — E282 Polycystic ovarian syndrome: Secondary | ICD-10-CM | POA: Diagnosis not present

## 2022-03-22 DIAGNOSIS — F341 Dysthymic disorder: Secondary | ICD-10-CM

## 2022-03-22 DIAGNOSIS — Z7982 Long term (current) use of aspirin: Secondary | ICD-10-CM | POA: Diagnosis not present

## 2022-03-22 DIAGNOSIS — N289 Disorder of kidney and ureter, unspecified: Secondary | ICD-10-CM

## 2022-03-22 DIAGNOSIS — F419 Anxiety disorder, unspecified: Secondary | ICD-10-CM

## 2022-03-22 DIAGNOSIS — R519 Headache, unspecified: Secondary | ICD-10-CM

## 2022-03-22 DIAGNOSIS — R41840 Attention and concentration deficit: Secondary | ICD-10-CM

## 2022-03-22 DIAGNOSIS — Z8742 Personal history of other diseases of the female genital tract: Secondary | ICD-10-CM

## 2022-03-22 HISTORY — PX: TEE WITHOUT CARDIOVERSION: SHX5443

## 2022-03-22 HISTORY — PX: BUBBLE STUDY: SHX6837

## 2022-03-22 LAB — POCT PREGNANCY, URINE: Preg Test, Ur: NEGATIVE

## 2022-03-22 LAB — ECHO TEE

## 2022-03-22 SURGERY — ECHOCARDIOGRAM, TRANSESOPHAGEAL
Anesthesia: Monitor Anesthesia Care

## 2022-03-22 MED ORDER — PROPOFOL 10 MG/ML IV BOLUS
INTRAVENOUS | Status: DC | PRN
  Administered 2022-03-22 (×4): 20 mg via INTRAVENOUS

## 2022-03-22 MED ORDER — MIDAZOLAM HCL 5 MG/5ML IJ SOLN
INTRAMUSCULAR | Status: DC | PRN
  Administered 2022-03-22: 2 mg via INTRAVENOUS

## 2022-03-22 MED ORDER — SODIUM CHLORIDE 0.9 % IV SOLN: INTRAVENOUS | Status: DC

## 2022-03-22 MED ORDER — PROPOFOL 500 MG/50ML IV EMUL
INTRAVENOUS | Status: DC | PRN
  Administered 2022-03-22: 150 ug/kg/min via INTRAVENOUS

## 2022-03-22 NOTE — Transfer of Care (Signed)
Immediate Anesthesia Transfer of Care Note  Patient: Kristen Hoover  Procedure(s) Performed: TRANSESOPHAGEAL ECHOCARDIOGRAM (TEE) BUBBLE STUDY  Patient Location: PACU  Anesthesia Type:MAC  Level of Consciousness: drowsy and patient cooperative  Airway & Oxygen Therapy: Patient Spontanous Breathing  Post-op Assessment: Report given to RN and Post -op Vital signs reviewed and stable  Post vital signs: Reviewed and stable  Last Vitals:  Vitals Value Taken Time  BP    Temp    Pulse 87 03/22/22 1150  Resp 14 03/22/22 1150  SpO2 94 % 03/22/22 1150  Vitals shown include unvalidated device data.  Last Pain:  Vitals:   03/22/22 1045  TempSrc: Temporal  PainSc: 0-No pain         Complications: No notable events documented.

## 2022-03-22 NOTE — Anesthesia Postprocedure Evaluation (Signed)
Anesthesia Post Note  Patient: Kristen Hoover  Procedure(s) Performed: TRANSESOPHAGEAL ECHOCARDIOGRAM (TEE) BUBBLE STUDY     Patient location during evaluation: PACU Anesthesia Type: MAC Level of consciousness: awake and alert Pain management: pain level controlled Vital Signs Assessment: post-procedure vital signs reviewed and stable Respiratory status: spontaneous breathing, nonlabored ventilation, respiratory function stable and patient connected to nasal cannula oxygen Cardiovascular status: stable and blood pressure returned to baseline Postop Assessment: no apparent nausea or vomiting Anesthetic complications: no  No notable events documented.  Last Vitals:  Vitals:   03/22/22 1230 03/22/22 1240  BP: 99/73 101/72  Pulse: 68 63  Resp: 17 14  Temp:    SpO2: 97% 98%    Last Pain:  Vitals:   03/22/22 1240  TempSrc:   PainSc: 0-No pain                 Tiajuana Amass

## 2022-03-22 NOTE — Anesthesia Preprocedure Evaluation (Signed)
Anesthesia Evaluation  Patient identified by MRN, date of birth, ID band Patient awake    Reviewed: Allergy & Precautions, NPO status , Patient's Chart, lab work & pertinent test results  Airway Mallampati: II  TM Distance: >3 FB     Dental   Pulmonary neg pulmonary ROS   Pulmonary exam normal        Cardiovascular negative cardio ROS Normal cardiovascular exam     Neuro/Psych CVA    GI/Hepatic Neg liver ROS,GERD  ,,  Endo/Other  negative endocrine ROS    Renal/GU Renal disease     Musculoskeletal   Abdominal   Peds  Hematology negative hematology ROS (+)   Anesthesia Other Findings   Reproductive/Obstetrics                             Anesthesia Physical Anesthesia Plan  ASA: 2  Anesthesia Plan: MAC   Post-op Pain Management:    Induction:   PONV Risk Score and Plan: 2 and Propofol infusion  Airway Management Planned: Natural Airway and Nasal Cannula  Additional Equipment:   Intra-op Plan:   Post-operative Plan:   Informed Consent: I have reviewed the patients History and Physical, chart, labs and discussed the procedure including the risks, benefits and alternatives for the proposed anesthesia with the patient or authorized representative who has indicated his/her understanding and acceptance.       Plan Discussed with:   Anesthesia Plan Comments:        Anesthesia Quick Evaluation

## 2022-03-22 NOTE — Progress Notes (Signed)
Pt no showed for appt 03/22/2022 for TEE at [place]. Attempted to call patient, went straight to generic vm.

## 2022-03-22 NOTE — Progress Notes (Signed)
LMOVm for patient to call the office back.  

## 2022-03-22 NOTE — Discharge Instructions (Signed)

## 2022-03-22 NOTE — Interval H&P Note (Signed)
History and Physical Interval Note:  03/22/2022 10:30 AM  Kristen Hoover  has presented today for surgery, with the diagnosis of stroke.  The various methods of treatment have been discussed with the patient and family. After consideration of risks, benefits and other options for treatment, the patient has consented to  Procedure(s): TRANSESOPHAGEAL ECHOCARDIOGRAM (TEE) (N/A) as a surgical intervention.  The patient's history has been reviewed, patient examined, no change in status, stable for surgery.  I have reviewed the patient's chart and labs.  Questions were answered to the patient's satisfaction.     [name]

## 2022-03-22 NOTE — H&P (Signed)
Office Visit 03/06/2022 [facility] at [facility], Wonda Cheng, MD Cardiology Cerebrovascular accident (CVA), unspecified mechanism [facility]) Dx Cerebrovascular Accident; Referred by Allwardt, Randa Evens, PA-C Reason for Visit   Additional Documentation  Vitals: BP 118/76   Pulse 90   Ht 5\' 6"  (1.676 m)   Wt 101 kg   LMP 02/07/2022 (Within Days)   SpO2 99%   BMI 35.93 kg/m   BSA 2.17 m      More Vitals  Flowsheets: NEWS,   MEWS Score,   Anthropometrics  Encounter Info: Billing Info,   History,   Allergies,   Detailed Report   All Notes   Progress Notes by Thayer Headings, MD at 03/06/2022 3:40 PM  Author: Thayer Headings, MD Author Type: Physician Filed: 03/06/2022  4:29 PM  Note Status: Signed Cosign: Cosign Not Required Encounter Date: 03/06/2022  Editor: Thayer Headings, MD (Physician)             Expand All Collapse All  Cardiology Office Note:     Date:  03/06/2022    ID:  Kristen Hoover, DOB 10/30/1985, MRN [id number]   PCP:  Allwardt, Randa Evens, PA-C              [facility] Cardiologist: Nahser      Referring MD: Fredirick Lathe, PA-C       Chief Complaint  Patient presents with   Cerebrovascular Accident      History of Present Illness:     Kristen Hoover is a 37 y.o. female with a hx of stroke, HLD , PCOS  Seen with husband Nicole Kindred      Had a migraine HA in Dec.   It lasted for 2-3 months Nausea, double vision,  CT shows several infarcts  Is going to see hematology .    Has stopped smoking cannabis ( smoked daily until her  stroke )    No hx of blood clots previously  No current neurological deficits Hx of PCOS    Was sedentary until recently ,  now is walking about a mile a day            Past Medical History:  Diagnosis Date   Anxiety and depression     Frequency of urination     GERD (gastroesophageal reflux disease)     History of pancreatitis       12-28-2018-  last episode 2006   PCOS (polycystic ovarian syndrome)     Renal calculus, right     Urgency of urination     Wears glasses             Past Surgical History:  Procedure Laterality Date   CYSTOSCOPY WITH RETROGRADE PYELOGRAM, URETEROSCOPY AND STENT PLACEMENT Right 01/05/2019    Procedure: CYSTOSCOPY/URETEROSCOP/STENT PLACEMENT;  Surgeon: Robley Fries, MD;  Location: [facility];  Service: Urology;  Laterality: Right;  15 MINS   CYSTOSCOPY WITH RETROGRADE PYELOGRAM, URETEROSCOPY AND STENT PLACEMENT Right 02/09/2019    Procedure: CYSTOSCOPY WITH RETROGRADE PYELOGRAM, URETEROSCOPY AND STENT PLACEMENT;  Surgeon: Robley Fries, MD;  Location: [facility];  Service: Urology;  Laterality: Right;  1 HR   ESOPHAGOGASTRODUODENOSCOPY   2015   HOLMIUM LASER APPLICATION Right [id number]    Procedure: HOLMIUM LASER APPLICATION;  Surgeon: Robley Fries, MD;  Location: [facility];  Service: Urology;  Laterality: Right;   TONSILLECTOMY   2014   WISDOM  TOOTH EXTRACTION   2018      Current Medications: Active Medications      Current Meds  Medication Sig   ALPRAZolam (XANAX) 0.5 MG tablet Take 1/2 to 1 tab po BID prn for acute panic.   atorvastatin (LIPITOR) 40 MG tablet Take 1 tablet (40 mg total) by mouth daily.   azelastine (ASTELIN) 0.1 % nasal spray Place 1 spray into both nostrils 2 (two) times daily. Use in each nostril as directed   calcium carbonate (TUMS - DOSED IN MG ELEMENTAL CALCIUM) 500 MG chewable tablet Chew 1 tablet by mouth as needed for indigestion or heartburn.   escitalopram (LEXAPRO) 10 MG tablet Take 10 mg by mouth daily.   FLUoxetine HCl (PROZAC PO) Take 20 mg by mouth at bedtime.   folic acid (FOLVITE) 0.5 MG tablet Take 0.5 mg by mouth daily.   meclizine (ANTIVERT) 12.5 MG tablet Take 1 tablet (12.5 mg total) by mouth 3 (three) times daily as needed for dizziness.   metFORMIN (GLUCOPHAGE-XR) 750 MG 24 hr  tablet Take 750 mg by mouth 2 (two) times daily.   omega-3 acid ethyl esters (LOVAZA) 1 g capsule Take by mouth 2 (two) times daily.   ondansetron (ZOFRAN) 4 MG tablet Take 1 tablet (4 mg total) by mouth every 8 (eight) hours as needed for nausea or vomiting.   spironolactone (ALDACTONE) 50 MG tablet Take 50 mg by mouth daily.        Allergies:   Patient has no known allergies.    Social History         Socioeconomic History   Marital status: Married      Spouse name: Not on file   Number of children: Not on file   Years of education: Not on file   Highest education level: Not on file  Occupational History   Not on file  Tobacco Use   Smoking status: Never   Smokeless tobacco: Never  Vaping Use   Vaping Use: Never used  Substance and Sexual Activity   Alcohol use: Not Currently   Drug use: Not Currently      Types: Marijuana      Comment: 12-28-2018  per pt last used 5 wks ago   Sexual activity: Not on file      Comment: husband had vasectomy  Other Topics Concern   Not on file  Social History Narrative    Are you right handed or left handed? Right     Are you currently employed ?     What is your current occupation?    Do you live at home alone?    Who lives with you?     What type of home do you live in: 1 story or 2 story? 2         Social Determinants of Adult nurse Strain: Not on file  Food Insecurity: Not on file  Transportation Needs: Not on file  Physical Activity: Not on file  Stress: Not on file  Social Connections: Not on file      Family History: The patient's family history includes Diabetes in her brother and father; Fibromyalgia in her mother; Headache in her mother; Heart failure in her father; Neuropathy in her father.   ROS:   Please see the history of present illness.     All other systems reviewed and are negative.   EKGs/Labs/Other Studies Reviewed:     The following studies were reviewed today:  EKG: March 06, 2022: Normal sinus rhythm at 90.  No ST or T wave changes.   Recent Labs: No results found for requested labs within last 365 days.  Recent Lipid Panel Labs (Brief)          Component Value Date/Time    CHOL 217 (H) 02/15/2022 1129    TRIG 145.0 02/15/2022 1129    HDL 38.40 (L) 02/15/2022 1129    CHOLHDL 6 02/15/2022 1129    VLDL 29.0 02/15/2022 1129    LDLCALC 149 (H) 02/15/2022 1129          Risk Assessment/Calculations:                   Physical Exam:     VS:  BP 118/76   Pulse 90   Ht 5\' 6"  (1.676 m)   Wt 222 lb 9.6 oz (101 kg)   LMP 02/07/2022 (Within Days)   SpO2 99%   BMI 35.93 kg/m         Wt Readings from Last 3 Encounters:  03/06/22 222 lb 9.6 oz (101 kg)  02/15/22 224 lb 3.2 oz (101.7 kg)  01/30/22 226 lb (102.5 kg)      GEN:  moderately obese , pleasant , young female , in no acute distress HEENT: Normal NECK: No JVD; No carotid bruits LYMPHATICS: No lymphadenopathy CARDIAC: RRR, no murmurs, rubs, gallops RESPIRATORY:  Clear to auscultation without rales, wheezing or rhonchi  ABDOMEN: Soft, non-tender, non-distended MUSCULOSKELETAL:  No edema; No deformity  SKIN: Warm and dry NEUROLOGIC:  Alert and oriented x 3 PSYCHIATRIC:  Normal affect    ASSESSMENT:     1. Cerebrovascular accident (CVA), unspecified mechanism ([place])     PLAN:         Stroke: The patient presented with headaches, nausea.  She was ultimately found to have had several strokes.  She has been on aspirin since that time.  Given that she has had multiple strokes there is likelihood that she is having embolic strokes.  Will place a 30-day live monitor on her for further evaluation for evidence of atrial fibrillation.   Will schedule her for a transesophageal echo to evaluate her cardiac function, valvular function, left atrial appendage and atrial septum.  She will need a bubble study.   She has PCOS so likely has high baseline estrogen levels.  If she has  hypercoagulable defect the combination of this plus high estrogen levels may have contributed to her stroke.   I will see her again in 3 months for follow-up visit.          Shared Decision Making/Informed Consent{   The risks [esophageal damage, perforation (1:10,000 risk), bleeding, pharyngeal hematoma as well as other potential complications associated with conscious sedation including aspiration, arrhythmia, respiratory failure and death], benefits (treatment guidance and diagnostic support) and alternatives of a transesophageal echocardiogram were discussed in detail with Ms. Wilmarth and she is willing to proceed.       Medication Adjustments/Labs and Tests Ordered: Current medicines are reviewed at length with the patient today.  Concerns regarding medicines are outlined above.     Orders Placed This Encounter  Procedures   CBC   Basic metabolic panel   EKG [id number]    No orders of the defined types were placed in this encounter.     Patient Instructions  Medication Instructions:  Your physician recommends that you continue on your current medications as directed. Please refer to the Current Medication list  given to you today.   *If you need a refill on your cardiac medications before your next appointment, please call your pharmacy*     Labs BMET and CBC prior to TEE on 3/15     Testing/Procedures: Your physician has requested that you have a TEE. During a TEE, sound waves are used to create images of your heart. It provides your doctor with information about the size and shape of your heart and how well your heart's chambers and valves are working. In this test, a transducer is attached to the end of a flexible tube that's guided down your throat and into your esophagus (the tube leading from you mouth to your stomach) to get a more detailed image of your heart. You are not awake for the procedure. Please see the instruction sheet given to you today. For further information  please visit [email or web address].     Your physician has recommended that you wear an event monitor. Event monitors are medical devices that record the heart's electrical activity. Doctors most often [place] these monitors to diagnose arrhythmias. Arrhythmias are problems with the speed or rhythm of the heartbeat. The monitor is a small, portable device. You can wear one while you do your normal daily activities. This is usually used to diagnose what is causing palpitations/syncope (passing out).       Follow-Up: At [facility], you and your health needs are our priority.  As part of our continuing mission to provide you with exceptional heart care, we have created designated Provider Care Teams.  These Care Teams include your primary Cardiologist (physician) and Advanced Practice Providers (APPs -  Physician Assistants and Nurse Practitioners) who all work together to provide you with the care you need, when you need it.     Your next appointment:   3 month(s)   Provider:   Dr. Acie Fredrickson   Other Instructions     Dear Kristen Hoover  You are scheduled for a TEE (Transesophageal Echocardiogram) on Friday, March 15 with Dr. Stanford Breed .  Please arrive at the [facility] (Main Entrance A) at [facility]: [address] [place], [place] [address] at 10:00 AM.    DIET:  Nothing to eat or drink after midnight except a sip of water with medications (see medication instructions below)   MEDICATION INSTRUCTIONS:     LABS:  BMET and CBC     Come to the lab at [facility] at [address] between the hours of 8:00 am and 4:30 pm. You do NOT have to be fasting.   FYI:  For your safety, and to allow [place] to monitor your vital signs accurately during the surgery/procedure we request: If you have artificial nails, gel coating, SNS etc, please have those removed prior to your surgery/procedure. Not having the nail coverings /polish removed may result in  cancellation or delay of your surgery/procedure.   You must have a responsible person to drive you home and stay in the waiting area during your procedure. Failure to do so could result in cancellation.   Bring your insurance cards.   *Special Note: Every effort is made to have your procedure done on time. Occasionally there are emergencies that occur at the hospital that may cause delays. Please be patient if a delay does occur.         Signed, Mertie Moores, MD  03/06/2022 4:18 PM    [facility]       For TEE,  no changes. [name]

## 2022-03-22 NOTE — Progress Notes (Signed)
     Transesophageal Echocardiogram Note  Kristen Hoover [id number] Nov 05, 1985  Procedure: Transesophageal Echocardiogram Indications: CVA  Procedure Details Consent: Obtained Time Out: Verified patient identification, verified procedure, site/side was marked, verified correct patient position, special equipment/implants available, Radiology Safety Procedures followed,  medications/allergies/relevent history reviewed, required imaging and test results available.  Performed  Medications:  Pt sedated by anesthesia with diprovan 300 mg IV total.  Normal LV function; atrial septal aneurysm; positive saline microcavitation study.   Complications: No apparent complications Patient did tolerate procedure well.  [name], MD

## 2022-03-22 NOTE — Progress Notes (Signed)
Patient arrived at the hospital 15 minutes late after case canceled. Added back in and will proceed.

## 2022-03-23 ENCOUNTER — Encounter (HOSPITAL_COMMUNITY): Payer: Self-pay | Admitting: Cardiology

## 2022-03-25 ENCOUNTER — Encounter: Payer: Self-pay | Admitting: Nurse Practitioner

## 2022-03-25 NOTE — Progress Notes (Signed)
Patient advised of her 2D echo results. TEE preformed on last Friday and Holter monitor she turns in at the end of the month.  Hematology appt schedule for first of April.

## 2022-03-27 ENCOUNTER — Encounter: Payer: Self-pay | Admitting: Physician Assistant

## 2022-03-28 ENCOUNTER — Ambulatory Visit: Payer: 59 | Admitting: Cardiology

## 2022-03-28 ENCOUNTER — Other Ambulatory Visit: Payer: Self-pay

## 2022-03-28 DIAGNOSIS — Z8742 Personal history of other diseases of the female genital tract: Secondary | ICD-10-CM

## 2022-03-28 NOTE — Telephone Encounter (Signed)
Please see pt msg and advise if ok to place referral

## 2022-04-01 ENCOUNTER — Encounter: Payer: Self-pay | Admitting: Cardiovascular Disease

## 2022-04-08 ENCOUNTER — Ambulatory Visit: Payer: 59 | Admitting: Physician Assistant

## 2022-04-10 ENCOUNTER — Inpatient Hospital Stay: Payer: 59 | Attending: Hematology and Oncology | Admitting: Hematology and Oncology

## 2022-04-10 ENCOUNTER — Inpatient Hospital Stay: Payer: 59

## 2022-04-10 VITALS — BP 108/74 | HR 81 | Temp 98.8°F | Resp 17 | Wt 224.4 lb

## 2022-04-10 DIAGNOSIS — Z79899 Other long term (current) drug therapy: Secondary | ICD-10-CM | POA: Insufficient documentation

## 2022-04-10 DIAGNOSIS — Z800 Family history of malignant neoplasm of digestive organs: Secondary | ICD-10-CM | POA: Diagnosis not present

## 2022-04-10 DIAGNOSIS — R760 Raised antibody titer: Secondary | ICD-10-CM

## 2022-04-10 DIAGNOSIS — K219 Gastro-esophageal reflux disease without esophagitis: Secondary | ICD-10-CM

## 2022-04-10 DIAGNOSIS — Z87891 Personal history of nicotine dependence: Secondary | ICD-10-CM | POA: Diagnosis not present

## 2022-04-10 NOTE — Progress Notes (Signed)
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 All other were WNL.  04/10/2022: establish care with Dr. 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 female with medical history significant for GERD, pancreatitis, and PCOS who presents for evaluation of elevated APS antibodies in the setting of CVA.  On review of the previous records Kristen Hoover underwent an MRI of the brain on 02/12/2022 which showed linear focus of restricted diffusion in the medial right thalamus concerning for subacute infarct.  She underwent subsequent hypercoagulation testing on 02/15/2022 which showed elevations in the beta-2 glycoprotein with IgG of 77 and the anticardiolipin IgG with a value of 40.  All other labs within normal limits.  Due to concerns for these findings the patient was referred to hematology for further evaluation and management.  On exam today Kristen Hoover reports that her symptoms began in early December 2023 when she was having migraines.  She reports that it would be very unusual for her.  After these persisted she underwent the brain imaging which confirmed the diagnosis of CVA.  She reports that she has been evaluated by cardiology and neurology and that the only concerning part of her evaluation was a small PFO noted on TTE.  She reports that she has had no other neurological symptoms such as weakness,  neuropathy, or other overt findings.  She reports that she is currently taking 81 mg p.o. of aspirin daily and tolerating well with no bleeding, bruising, or dark stools.  On further discussion she reports that she has no hematological history in her family that she is aware of.  She reports her father had heart disease and her mother had fibromyalgia and hyperlipidemia.  She reports that she has no children and no history of miscarriage.  She is not currently taking any estrogen-based supplements and is a non-smoker, though she quit smoking cannabis approximately 2 months ago.  She drinks alcohol only a few times per year.  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  Overall she feels well and denies any fevers, chills, sweats, nausea, vomiting or diarrhea.  A full 10 point ROS is otherwise negative.  MEDICAL HISTORY:  Past Medical History:  Diagnosis Date   Anxiety and depression    Frequency of urination    GERD (gastroesophageal reflux disease)    History of pancreatitis    12-28-2018-  last episode 2006   PCOS (polycystic ovarian syndrome)    Renal calculus, right    Urgency of urination    Wears glasses     SURGICAL HISTORY: Past Surgical History:  Procedure Laterality Date   BUBBLE STUDY  03/22/2022   Procedure: BUBBLE STUDY;  Surgeon: Lelon Perla, MD;  Location: [facility];  Service: Cardiovascular;;   CYSTOSCOPY WITH RETROGRADE PYELOGRAM, URETEROSCOPY AND STENT PLACEMENT Right 01/05/2019   Procedure: CYSTOSCOPY/URETEROSCOP/STENT PLACEMENT;  Surgeon: Robley Fries, MD;  Location: [facility];  Service: Urology;  Laterality: Right;  75 MINS   CYSTOSCOPY  WITH RETROGRADE PYELOGRAM, URETEROSCOPY AND STENT PLACEMENT Right 02/09/2019   Procedure: CYSTOSCOPY WITH RETROGRADE PYELOGRAM, URETEROSCOPY AND STENT PLACEMENT;  Surgeon: Robley Fries, MD;  Location: [facility];  Service: Urology;  Laterality: Right;  1 HR    ESOPHAGOGASTRODUODENOSCOPY  2015   HOLMIUM LASER APPLICATION Right [id number]   Procedure: HOLMIUM LASER APPLICATION;  Surgeon: Robley Fries, MD;  Location: [facility];  Service: Urology;  Laterality: Right;   TEE WITHOUT CARDIOVERSION N/A 03/22/2022   Procedure: TRANSESOPHAGEAL ECHOCARDIOGRAM (TEE);  Surgeon: Lelon Perla, MD;  Location: [facility] ENDOSCOPY;  Service: Cardiovascular;  Laterality: N/A;   TONSILLECTOMY  2014   WISDOM TOOTH EXTRACTION  2018    SOCIAL HISTORY: Social History   Socioeconomic History   Marital status: Married    Spouse name: Not on file   Number of children: Not on file   Years of education: Not on file   Highest education level: Not on file  Occupational History   Not on file  Tobacco Use   Smoking status: Never   Smokeless tobacco: Never  Vaping Use   Vaping Use: Never used  Substance and Sexual Activity   Alcohol use: Not Currently   Drug use: Not Currently    Types: Marijuana    Comment: 12-28-2018  per pt last used 5 wks ago   Sexual activity: Not on file    Comment: husband had vasectomy  Other Topics Concern   Not on file  Social History Narrative   Are you right handed or left handed? Right    Are you currently employed ?    What is your current occupation?   Do you live at home alone?   Who lives with you?    What type of home do you live in: 1 story or 2 story? 2       Social Determinants of Health   Financial Resource Strain: Not on file  Food Insecurity: No Food Insecurity (04/10/2022)   Hunger Vital Sign    Worried About Running Out of Food in the Last Year: Never true    Ran Out of Food in the Last Year: Never true  Transportation Needs: No Transportation Needs (04/10/2022)   PRAPARE - Hydrologist (Medical): No    Lack of Transportation (Non-Medical): No  Physical Activity: Not on file  Stress: Not on file  Social Connections: Not on file  Intimate Partner Violence: Not At Risk  (04/10/2022)   Humiliation, Afraid, Rape, and Kick questionnaire    Fear of Current or Ex-Partner: No    Emotionally Abused: No    Physically Abused: No    Sexually Abused: No    FAMILY HISTORY: Family History  Problem Relation Age of Onset   Fibromyalgia Mother    Headache Mother    Neuropathy Father    Diabetes Father    Heart failure Father    Diabetes Brother    Colon cancer Paternal Grandfather     ALLERGIES:  has No Known Allergies.  MEDICATIONS:  Current Outpatient Medications  Medication Sig Dispense Refill   aspirin 81 MG chewable tablet Chew 81 mg by mouth daily.     atorvastatin (LIPITOR) 40 MG tablet Take 1 tablet (40 mg total) by mouth daily. 30 tablet 3   calcium carbonate (TUMS - DOSED IN MG ELEMENTAL CALCIUM) 500 MG chewable tablet Chew 2 tablets by mouth daily as needed for indigestion or heartburn.     escitalopram (  LEXAPRO) 10 MG tablet Take 10 mg by mouth daily.     FLUoxetine (PROZAC) 10 MG capsule Take 10 mg by mouth daily.     FLUoxetine (PROZAC) 40 MG capsule Take 10 mg by mouth at bedtime. Patient taking 10 mg, weaning off medication     folic acid (FOLVITE) 1 MG tablet Take 1 mg by mouth daily.     ibuprofen (ADVIL) 200 MG tablet Take 400 mg by mouth every 6 (six) hours as needed for headache, mild pain or moderate pain.     metFORMIN (GLUCOPHAGE-XR) 750 MG 24 hr tablet Take 750 mg by mouth 2 (two) times daily.     OMEGA-3-ACID ETHYL ESTERS PO Take 650 mg by mouth daily.     ondansetron (ZOFRAN) 4 MG tablet Take 1 tablet (4 mg total) by mouth every 8 (eight) hours as needed for nausea or vomiting. 20 tablet 0   Prasterone, DHEA, (DHEA PO) Take 5 mg by mouth daily.     spironolactone (ALDACTONE) 25 MG tablet Take 25 mg by mouth 2 (two) times daily.     ALPRAZolam (XANAX) 0.5 MG tablet Take 1/2 to 1 tab po BID prn for acute panic. (Patient not taking: Reported on 04/10/2022) 30 tablet 0   amphetamine-dextroamphetamine (ADDERALL) 10 MG tablet Take 10 mg by  mouth daily as needed. (Patient not taking: Reported on 04/10/2022)     azelastine (ASTELIN) 0.1 % nasal spray Place 1 spray into both nostrils 2 (two) times daily. Use in each nostril as directed (Patient not taking: Reported on 04/10/2022) 30 mL 2   meclizine (ANTIVERT) 12.5 MG tablet Take 1 tablet (12.5 mg total) by mouth 3 (three) times daily as needed for dizziness. (Patient not taking: Reported on 03/14/2022) 20 tablet 0   No current facility-administered medications for this visit.    REVIEW OF SYSTEMS:   Constitutional: ( - ) fevers, ( - )  chills , ( - ) night sweats Eyes: ( - ) blurriness of vision, ( - ) double vision, ( - ) watery eyes Ears, nose, mouth, throat, and face: ( - ) mucositis, ( - ) sore throat Respiratory: ( - ) cough, ( - ) dyspnea, ( - ) wheezes Cardiovascular: ( - ) palpitation, ( - ) chest discomfort, ( - ) lower extremity swelling Gastrointestinal:  ( - ) nausea, ( - ) heartburn, ( - ) change in bowel habits Skin: ( - ) abnormal skin rashes Lymphatics: ( - ) new lymphadenopathy, ( - ) easy bruising Neurological: ( - ) numbness, ( - ) tingling, ( - ) new weaknesses Behavioral/Psych: ( - ) mood change, ( - ) new changes  All other systems were reviewed with the patient and are negative.  PHYSICAL EXAMINATION:  Vitals:   04/10/22 1351  BP: 108/74  Pulse: 81  Resp: 17  Temp: 98.8 F (37.1 C)  SpO2: 98%   Filed Weights   04/10/22 1351  Weight: 224 lb 7 oz (101.8 kg)    GENERAL: well appearing young Caucasian female in NAD  SKIN: skin color, texture, turgor are normal, no rashes or significant lesions EYES: conjunctiva are pink and non-injected, sclera clear LUNGS: clear to auscultation and percussion with normal breathing effort HEART: regular rate & rhythm and no murmurs and no lower extremity edema Musculoskeletal: no cyanosis of digits and no clubbing  PSYCH: alert & oriented x 3, fluent speech NEURO: no focal motor/sensory deficits  LABORATORY DATA:   I have reviewed the data as  listed    Latest Ref Rng & Units 03/18/2022    2:38 PM 01/11/2021    8:15 AM 03/01/2020    9:27 AM  CBC  WBC 3.4 - 10.8 x10E3/uL 11.2  8.5  10.4   Hemoglobin 11.1 - 15.9 g/dL 12.6  13.2  12.8   Hematocrit 34.0 - 46.6 % 35.8  38.2  36.8   Platelets 150 - 450 x10E3/uL 267  228.0  253.0        Latest Ref Rng & Units 03/18/2022    2:38 PM 01/11/2021    8:15 AM 03/01/2020    9:27 AM  CMP  Glucose 70 - 99 mg/dL 75  96  86   BUN 6 - 20 mg/dL 10  12  9    Creatinine 0.57 - 1.00 mg/dL 0.84  0.77  0.71   Sodium 134 - 144 mmol/L 142  139  136   Potassium 3.5 - 5.2 mmol/L 4.1  4.2  3.7   Chloride 96 - 106 mmol/L 104  106  103   CO2 20 - 29 mmol/L 23  26  26    Calcium 8.7 - 10.2 mg/dL 9.5  9.1  9.4   Total Protein 6.0 - 8.3 g/dL  7.3  7.6   Total Bilirubin 0.2 - 1.2 mg/dL  0.8  0.6   Alkaline Phos 39 - 117 U/L  83  84   AST 0 - 37 U/L  14  24   ALT 0 - 35 U/L  33  31      ASSESSMENT & PLAN Kristen Hoover 37 y.o. female with medical history significant for GERD, pancreatitis, and PCOS who presents for evaluation of elevated APS antibodies in the setting of CVA.  After review of the labs, review of the records, and discussion with the patient the patients findings are most consistent with elevated APS antibodies in the setting of a prior CVA.  Antiphospholipid antibody syndrome is a hypercoagulation and autoimmune condition that results in increased frequency of venous and arterial thrombosis. In order to meet the diagnostic criteria for APS a patient must have one of both the clinical and laboratory criteria noted from the [place] (noted below). Laboratory criteria must be met on at least 2 occasions, separated by at least 12 weeks. Presence of antibodies in the absence of clinical criteria fails to meet diagnostic criteria and does not merit treatment or further investigation.     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  Of the two the beta-2 glycoprotein is the more clinically significant. -- Lupus anticoagulant panel was also drawn, however this appears to been drawn while on anticoagulation. -- Would recommend patient be transition to Coumadin therapy given the fact she had an arterial thrombus with an elevated antiphospholipid antibody. -- Patient's initial testing was on 02/15/2022.  Confirmatory testing recommended in May 2024. --RTC in 1 month for labs and in 3 months for repeat evaluation  No orders of the defined types were placed in this encounter.   All questions were answered. The patient knows to call the clinic with any problems, questions or concerns.  A total of more than 60 minutes were spent on this encounter with face-to-face time and non-face-to-face time, including preparing to see the patient, ordering tests and/or medications, counseling the patient and coordination of care as outlined above.   Ledell Peoples, MD Department  of Hematology/Oncology [place] at [facility] Phone: [phone] Pager: [phone] Email: [email or web address]  04/10/2022 5:09 PM

## 2022-04-11 ENCOUNTER — Telehealth: Payer: Self-pay

## 2022-04-11 ENCOUNTER — Ambulatory Visit (INDEPENDENT_AMBULATORY_CARE_PROVIDER_SITE_OTHER): Payer: 59 | Admitting: Physician Assistant

## 2022-04-11 ENCOUNTER — Encounter: Payer: Self-pay | Admitting: Physician Assistant

## 2022-04-11 VITALS — BP 92/64 | HR 73 | Temp 97.7°F | Ht 66.0 in | Wt 225.4 lb

## 2022-04-11 DIAGNOSIS — Z8742 Personal history of other diseases of the female genital tract: Secondary | ICD-10-CM

## 2022-04-11 DIAGNOSIS — R760 Raised antibody titer: Secondary | ICD-10-CM | POA: Insufficient documentation

## 2022-04-11 DIAGNOSIS — F32A Depression, unspecified: Secondary | ICD-10-CM

## 2022-04-11 DIAGNOSIS — I639 Cerebral infarction, unspecified: Secondary | ICD-10-CM | POA: Diagnosis not present

## 2022-04-11 DIAGNOSIS — F419 Anxiety disorder, unspecified: Secondary | ICD-10-CM | POA: Diagnosis not present

## 2022-04-11 MED ORDER — SPIRONOLACTONE 50 MG PO TABS: 50.0000 mg | ORAL_TABLET | Freq: Every day | ORAL | 3 refills | Status: DC

## 2022-04-11 NOTE — Telephone Encounter (Addendum)
Pt had OV with PCP today for f/u positive antiphospholipid antibody syndrome. Per PCP, pt is to initiate anticoagulation therapy with warfarin.   Per the [employer]; When warfarin is initiated, the international normalized ratio (INR) may begin to respond after two to three days because of the depletion of factor VII. During this initial period, the patient can enter a hypercoagulable state caused by warfarin's effects on proteins C and S.1 Heparin or LMWH should be administered with warfarin initiation and continued until the INR has been in the targeted therapeutic range for a minimum of 24 hours.   CrCl: 147.65 mL/min Wt: 102.2 kg Lovenox recommendation 1.5 mg/kg/QD  Initial warfarin dose will be 5 mg QD and lovenox will be 150 mg QD until INR is within therapeutic range of 2.0-3.0. Will check first INR 5-7 days after warfarin initiation.

## 2022-04-11 NOTE — Progress Notes (Signed)
Subjective:    Patient ID: Kristen Hoover, female    DOB: 02-22-1985, 37 y.o.   MRN: [id number]  Chief Complaint  Patient presents with   Follow-up    Pt in office for 1 mon f/u; pt wants to discuss, meeting with hematology yesterday, thinks she may have APS blood clotting disorder; also mental health progress, medications, LOA from work etc.     HPI Patient is in today for recheck as listed in CC.   Last week had a few days of dizziness and headaches, subsided after about three days.  Currently down to Prozac 10 mg daily and also in conjunction with Lexapro 10 mg.   Graduated from [employer]. Trauma therapy once weekly - starts on Monday.   [facility] previously - she had her on spironolactone for PCOS and facial hair; requesting I take over this prescription.  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 Plans to start on coumadin.   Past Medical History:  Diagnosis Date   Anxiety and depression    Frequency of urination    GERD (gastroesophageal reflux disease)    History of pancreatitis    12-28-2018-  last episode 2006   PCOS (polycystic ovarian syndrome)    Renal calculus, right    Urgency of urination    Wears glasses     Past Surgical History:  Procedure Laterality Date   BUBBLE STUDY  03/22/2022   Procedure: BUBBLE STUDY;  Surgeon: Lewayne Bunting, MD;  Location: [facility] ENDOSCOPY;  Service: Cardiovascular;;   CYSTOSCOPY WITH RETROGRADE PYELOGRAM, URETEROSCOPY AND STENT PLACEMENT Right 01/05/2019   Procedure: CYSTOSCOPY/URETEROSCOP/STENT PLACEMENT;  Surgeon: Noel Christmas, MD;  Location: [facility];  Service: Urology;  Laterality: Right;  75 MINS   CYSTOSCOPY WITH RETROGRADE PYELOGRAM, URETEROSCOPY AND STENT PLACEMENT Right 02/09/2019   Procedure: CYSTOSCOPY WITH RETROGRADE PYELOGRAM, URETEROSCOPY AND STENT PLACEMENT;  Surgeon: Noel Christmas, MD;  Location: [facility];  Service: Urology;   Laterality: Right;  1 HR   ESOPHAGOGASTRODUODENOSCOPY  2015   HOLMIUM LASER APPLICATION Right 02/09/2019   Procedure: HOLMIUM LASER APPLICATION;  Surgeon: Noel Christmas, MD;  [facility];  Service: Urology;  Laterality: Right;   TEE WITHOUT CARDIOVERSION N/A 03/22/2022   Procedure: TRANSESOPHAGEAL ECHOCARDIOGRAM (TEE);  Surgeon: Lewayne Bunting, MD;  Location: [facility] ENDOSCOPY;  Service: Cardiovascular;  Laterality: N/A;   TONSILLECTOMY  2014   WISDOM TOOTH EXTRACTION  2018    Family History  Problem Relation Age of Onset   Fibromyalgia Mother    Headache Mother    Neuropathy Father    Diabetes Father    Heart failure Father    Diabetes Brother    Colon cancer Paternal Grandfather     Social History   Tobacco Use   Smoking status: Never   Smokeless tobacco: Never  Vaping Use   Vaping Use: Never used  Substance Use Topics   Alcohol use: Not Currently   Drug use: Not Currently    Types: Marijuana    Comment: 12-28-2018  per pt last used 5 wks ago     No Known Allergies  Review of Systems NEGATIVE UNLESS OTHERWISE INDICATED IN HPI      Objective:     BP 92/64 (BP Location: Left Arm)   Pulse 73   Temp 97.7 F (36.5 C) (Temporal)   Ht 5\' 6"  (1.676 m)   Wt 225 lb 6.4 oz (102.2 kg)   LMP 04/11/2022 (Exact Date)  Comment: neg preg 3/15  SpO2 95%   BMI 36.38 kg/m   Wt Readings from Last 3 Encounters:  04/11/22 225 lb 6.4 oz (102.2 kg)  04/10/22 224 lb 7 oz (101.8 kg)  03/22/22 221 lb 9.6 oz (100.5 kg)    BP Readings from Last 3 Encounters:  04/11/22 92/64  04/10/22 108/74  03/22/22 101/72     Physical Exam Vitals and nursing note reviewed.  Constitutional:      Appearance: Normal appearance.  Eyes:     Extraocular Movements: Extraocular movements intact.     Conjunctiva/sclera: Conjunctivae normal.     Pupils: Pupils are equal, round, and reactive to light.  Cardiovascular:     Rate and Rhythm: Normal rate and regular rhythm.      Pulses: Normal pulses.     Heart sounds: Normal heart sounds. No murmur heard. Pulmonary:     Effort: Pulmonary effort is normal.     Breath sounds: Normal breath sounds.  Neurological:     General: No focal deficit present.     Mental Status: She is alert and oriented to person, place, and time.  Psychiatric:        Mood and Affect: Mood normal.        Behavior: Behavior normal.        Assessment & Plan:  Antiphospholipid antibody positive Assessment & Plan: Personally reviewed hematology note.  Appreciate them seeing her.  Setting her up with Coumadin clinic.   Anxiety and depression Assessment & Plan: [facility] outpatient program.  She will start trauma therapy.  Currently switching from Prozac to Lexapro.  I suspect this contributed to her headache last week as she is making this change.  So far not noticing much difference with Lexapro 10 mg, but continues to give this time.   History of PCOS Assessment & Plan: She has been doing spironolactone 50 mg once daily to help with facial hair.  I will take over prescribing this for her.  Prescription sent for her today.  Needs to monitor blood pressure.   Cerebrovascular accident (CVA), unspecified mechanism Assessment & Plan: Abnormal brain MRI She has had follow-up with neurology and cardiology Question prior cannabis use as underlying etiology, new information as of yesterday with hematology as well that she has antiphospholipid antibody that is positive. She will continue on daily aspirin 81 mg. She is going to start on Coumadin and I am getting her set up with our Coumadin clinic.   Other orders -     Spironolactone; Take 1 tablet (50 mg total) by mouth daily.  Dispense: 90 tablet; Refill: 3        Return in about 1 month (around 05/11/2022) for recheck prior to release back to work .    Sama Arauz M Kaydence Baba, PA-C

## 2022-04-12 NOTE — Telephone Encounter (Signed)
LVM for pt to call to discuss therapy.

## 2022-04-15 NOTE — Assessment & Plan Note (Signed)
[facility] outpatient program.  She will start trauma therapy.  Currently switching from Prozac to Lexapro.  I suspect this contributed to her headache last week as she is making this change.  So far not noticing much difference with Lexapro 10 mg, but continues to give this time.

## 2022-04-15 NOTE — Telephone Encounter (Signed)
Allwardt, Alyssa M, PA-C2 hours ago (11:54 AM)    Noted and agreed, thank you

## 2022-04-15 NOTE — Assessment & Plan Note (Signed)
She has been doing spironolactone 50 mg once daily to help with facial hair.  I will take over prescribing this for her.  Prescription sent for her today.  Needs to monitor blood pressure.

## 2022-04-15 NOTE — Telephone Encounter (Signed)
Pt returned call. Advised pt on warfarin and it would be best to have apt with coumadin clinic before starting warfarin to provide education and answer any questions. Pt agreed and requested apt tomorrow. Advised coumadin clinic is at [facility]. Pt agreed to apt at [facility] at 1:30. Advised if anything is needed before the apt to contact the coumadin clinic. Pt verbalized understanding.

## 2022-04-15 NOTE — Assessment & Plan Note (Signed)
Personally reviewed hematology note.  Appreciate them seeing her.  Setting her up with Coumadin clinic.

## 2022-04-15 NOTE — Assessment & Plan Note (Signed)
Abnormal brain MRI She has had follow-up with neurology and cardiology Question prior cannabis use as underlying etiology, new information as of yesterday with hematology as well that she has antiphospholipid antibody that is positive. She will continue on daily aspirin 81 mg. She is going to start on Coumadin and I am getting her set up with our Coumadin clinic.

## 2022-04-15 NOTE — Telephone Encounter (Signed)
Noted and agreed, thank you. 

## 2022-04-16 ENCOUNTER — Ambulatory Visit: Payer: 59

## 2022-04-16 DIAGNOSIS — Z7901 Long term (current) use of anticoagulants: Secondary | ICD-10-CM

## 2022-04-16 MED ORDER — ENOXAPARIN SODIUM 150 MG/ML IJ SOSY: 150.0000 mg | PREFILLED_SYRINGE | INTRAMUSCULAR | 0 refills | Status: DC

## 2022-04-16 MED ORDER — WARFARIN SODIUM 5 MG PO TABS: ORAL_TABLET | ORAL | 0 refills | Status: DC

## 2022-04-16 NOTE — Patient Instructions (Signed)
Start taking 1 tablet of warfarin every evening and injection 1 syringe of lovenox every morning at the same time. Check INR at appointment on 4/15 at 12:45 at [facility], at [address], [place]. Coumadin clinic number is [phone].

## 2022-04-16 NOTE — Progress Notes (Signed)
Pt in today for warfarin and lovenox education. Pt will start warfarin and lovenox tomorrow. Indication is APS. Pt will use lovenox injections, 150 mg QD, concurrently with warfarin dose until target INR is reached.  Lovenox and warfarin sent to pharmacy of choice.   A full discussion of the nature of anticoagulants has been carried out.  A benefit risk analysis has been presented to the patient, so that they understand the justification for choosing anticoagulation at this time. The need for frequent and regular monitoring, precise dosage adjustment and compliance is stressed.  Side effects of potential bleeding are discussed.  The patient should avoid any OTC items containing aspirin or ibuprofen, and should avoid great swings in general diet.  Avoid alcohol consumption.  Call if any signs of abnormal bleeding.  Pt was provided written educational material, vitamin K book, [employer] new pt guide, brief list of OTC meds to use and not use and the anticoagulation ZONE tool. Pt also provided with website, https://rivera.org/.pdf This will provide further information.

## 2022-04-17 ENCOUNTER — Encounter: Payer: Self-pay | Admitting: Physician Assistant

## 2022-04-18 ENCOUNTER — Encounter: Payer: Self-pay | Admitting: Physician Assistant

## 2022-04-22 ENCOUNTER — Ambulatory Visit: Payer: 59

## 2022-04-22 DIAGNOSIS — Z7901 Long term (current) use of anticoagulants: Secondary | ICD-10-CM | POA: Diagnosis not present

## 2022-04-22 LAB — POCT INR: INR: 1.1 — AB (ref 2.0–3.0)

## 2022-04-22 MED ORDER — ENOXAPARIN SODIUM 150 MG/ML IJ SOSY: 150.0000 mg | PREFILLED_SYRINGE | INTRAMUSCULAR | 0 refills | Status: DC

## 2022-04-22 NOTE — Progress Notes (Addendum)
Pt started warfarin one week ago with lovenox injections. Continue lovenox injections daily in the AM and increase dose today and tomorrow to take 1 1/2 tablets and then change weekly dose to take 1 tablet daily except take 1 1/2 tablets on Wednesdays and recheck on 4/19 at [facility].  Sent in new script for 3 more lovenox syringes to pt's pharmacy of choice.

## 2022-04-22 NOTE — Patient Instructions (Addendum)
Pre visit review using our clinic review tool, if applicable. No additional management support is needed unless otherwise documented below in the visit note.  Continue lovenox injections daily in the AM and increase dose today and tomorrow to take 1 1/2 tablets and then change weekly dose to take 1 tablet daily except take 1 1/2 tablets on Wednesdays and recheck on 4/19 at [facility].

## 2022-04-22 NOTE — Addendum Note (Signed)
Addended by: Sherrie George A on: 04/22/2022 01:37 PM   Modules accepted: Orders

## 2022-04-24 NOTE — Telephone Encounter (Signed)
Pt reports she will be on vacation, out of the country, for one week, next week. Pt was in for INR check on 4/15, Monday, and INR was 1.1  Pt is also using lovenox injections until INR goal, 2-3, is reached. The injections were continued until next INR check on 4/19. Pt is aware if she is not in range or supra-therapeutic on 4/19 she will need to use lovenox injections during her vacation. Pt has agreed to this plan of care.   Pt will have INR checked again immediately after returning from vacation, which will be a span of 10 days. Pt has been educated on s/s of abnormal bruising or bleeding and when to do if any s/s occur. Pt verbalized understanding.

## 2022-04-26 ENCOUNTER — Ambulatory Visit: Payer: 59

## 2022-04-26 ENCOUNTER — Other Ambulatory Visit: Payer: Self-pay | Admitting: Physician Assistant

## 2022-04-26 DIAGNOSIS — Z7901 Long term (current) use of anticoagulants: Secondary | ICD-10-CM

## 2022-04-26 LAB — POCT INR
INR: 1.2 — AB (ref 2.0–3.0)
INR: 1.2 — AB (ref 2.0–3.0)
INR: 2.1 (ref 2.0–3.0)

## 2022-04-26 MED ORDER — ENOXAPARIN SODIUM 150 MG/ML IJ SOSY: 150.0000 mg | PREFILLED_SYRINGE | INTRAMUSCULAR | 0 refills | Status: DC

## 2022-04-26 NOTE — Telephone Encounter (Signed)
Pt in this morning for INR check and it was 1.2  Pt will continue lovenox daily until recheck when she returns from out of the country on 4/29.  Warfarin dosing was increased 20% per anticoagulation protocols.

## 2022-04-26 NOTE — Progress Notes (Addendum)
Pt taking warfarin with lovenox injections. Pt will be out of country next week on vacation. She will f/u the following week for INR check. Pt was educated on s/s of bleeding or abnormal bruising and knows to go to ER if any s/s. Pt verbalized understanding.  Continue lovenox injections daily in the AM and increase dose today to take 1 1/2 tablets and then change weekly dose to take 1 1/2 tablet daily except take 1 tablets on Mondays, Wednesdays and Fridays and recheck on 4/29 at [place], [address].  Also provided pt a letter stating the need to carry lovenox syringes with her while out of the country.   Sent script for 10 lovenox syringes.

## 2022-04-26 NOTE — Patient Instructions (Addendum)
Pre visit review using our clinic review tool, if applicable. No additional management support is needed unless otherwise documented below in the visit note.  Continue lovenox injections daily in the AM and increase dose today to take 1 1/2 tablets and then change weekly dose to take 1 1/2 tablet daily except take 1 tablets on Mondays, Wednesdays and Fridays and recheck on 4/29 at [place], [address].

## 2022-04-26 NOTE — Telephone Encounter (Signed)
Noted and agreed, thank you. 

## 2022-04-26 NOTE — Telephone Encounter (Signed)
Refilled

## 2022-04-26 NOTE — Telephone Encounter (Signed)
Last OV 04/11/22 Next OV 05/13/22  Azelastine Nasal Spray  Last refill 01/30/22  Qty 30 mL / 2

## 2022-05-05 NOTE — Progress Notes (Unsigned)
05/05/2022 Kristen Hoover [id number] 1985-06-17   CHIEF COMPLAINT: Blood in stool  HISTORY OF PRESENT ILLNESS: Kristen Hoover is a 37 year old female with a past medical history of anxiety, depression, PCOS, kidney stones, PFO, positive antiphospholipid antibody, CVA, pancreatitis and GERD.   She presents to our  office today as referred by Alyssa Allwardt PA-C for further evaluation regarding blood in stool. Paternal grandfather with history of colon cancer.      Latest Ref Rng & Units 03/18/2022    2:38 PM 01/11/2021    8:15 AM 03/01/2020    9:27 AM  CBC  WBC 3.4 - 10.8 x10E3/uL 11.2  8.5  10.4   Hemoglobin 11.1 - 15.9 g/dL 53.6  64.4  03.4   Hematocrit 34.0 - 46.6 % 35.8  38.2  36.8   Platelets 150 - 450 x10E3/uL 267  228.0  253.0        Latest Ref Rng & Units 03/18/2022    2:38 PM 01/11/2021    8:15 AM 03/01/2020    9:27 AM  CMP  Glucose 70 - 99 mg/dL 75  96  86   BUN 6 - 20 mg/dL 10  12  9    Creatinine 0.57 - 1.00 mg/dL 7.42  5.95  6.38   Sodium 134 - 144 mmol/L 142  139  136   Potassium 3.5 - 5.2 mmol/L 4.1  4.2  3.7   Chloride 96 - 106 mmol/L 104  106  103   CO2 20 - 29 mmol/L 23  26  26    Calcium 8.7 - 10.2 mg/dL 9.5  9.1  9.4   Total Protein 6.0 - 8.3 g/dL  7.3  7.6   Total Bilirubin 0.2 - 1.2 mg/dL  0.8  0.6   Alkaline Phos 39 - 117 U/L  83  84   AST 0 - 37 U/L  14  24   ALT 0 - 35 U/L  33  31       Past Medical History:  Diagnosis Date   Anxiety and depression    Frequency of urination    GERD (gastroesophageal reflux disease)    History of pancreatitis    12-28-2018-  last episode 2006   PCOS (polycystic ovarian syndrome)    Renal calculus, right    Urgency of urination    Wears glasses    Past Surgical History:  Procedure Laterality Date   BUBBLE STUDY  03/22/2022   Procedure: BUBBLE STUDY;  Surgeon: Lewayne Bunting, MD;  Location: [facility] ENDOSCOPY;  Service: Cardiovascular;;   CYSTOSCOPY WITH RETROGRADE PYELOGRAM, URETEROSCOPY AND  STENT PLACEMENT Right 01/05/2019   Procedure: CYSTOSCOPY/URETEROSCOP/STENT PLACEMENT;  Surgeon: Noel Christmas, MD;  Location: [facility] [place];  Service: Urology;  Laterality: Right;  75 MINS   CYSTOSCOPY WITH RETROGRADE PYELOGRAM, URETEROSCOPY AND STENT PLACEMENT Right 02/09/2019   Procedure: CYSTOSCOPY WITH RETROGRADE PYELOGRAM, URETEROSCOPY AND STENT PLACEMENT;  Surgeon: Noel Christmas, MD;  Location: [facility];  Service: Urology;  Laterality: Right;  1 HR   ESOPHAGOGASTRODUODENOSCOPY  2015   HOLMIUM LASER APPLICATION Right 02/09/2019   Procedure: HOLMIUM LASER APPLICATION;  Surgeon: Noel Christmas, MD;  Location: [facility];  Service: Urology;  Laterality: Right;   TEE WITHOUT CARDIOVERSION N/A 03/22/2022   Procedure: TRANSESOPHAGEAL ECHOCARDIOGRAM (TEE);  Surgeon: Lewayne Bunting, MD;  Location: [facility] ENDOSCOPY;  Service: Cardiovascular;  Laterality: N/A;   TONSILLECTOMY  2014   WISDOM TOOTH EXTRACTION  2018   Social History:  Family History:    reports that she has never smoked. She has never used smokeless tobacco. She reports that she does not currently use alcohol. She reports that she does not currently use drugs after having used the following drugs: Marijuana. family history includes Colon cancer in her paternal grandfather; Diabetes in her brother and father; Fibromyalgia in her mother; Headache in her mother; Heart failure in her father; Neuropathy in her father. No Known Allergies    Outpatient Encounter Medications as of 05/06/2022  Medication Sig   ALPRAZolam (XANAX) 0.5 MG tablet Take 1/2 to 1 tab po BID prn for acute panic. (Patient not taking: Reported on 04/11/2022)   aspirin 81 MG chewable tablet Chew 81 mg by mouth daily.   atorvastatin (LIPITOR) 40 MG tablet Take 1 tablet (40 mg total) by mouth daily.   Azelastine HCl 137 MCG/SPRAY SOLN PLACE 1 SPRAY INTO BOTH NOSTRILS 2 (TWO) TIMES DAILY. USE IN EACH NOSTRIL AS DIRECTED    calcium carbonate (TUMS - DOSED IN MG ELEMENTAL CALCIUM) 500 MG chewable tablet Chew 2 tablets by mouth daily as needed for indigestion or heartburn.   enoxaparin (LOVENOX) 150 MG/ML injection Inject 1 mL (150 mg total) into the skin daily. TAKE AS DIRECTED BY ANTICOAGULATION CLINIC EVERY MORNING   escitalopram (LEXAPRO) 10 MG tablet Take 10 mg by mouth daily.   FLUoxetine (PROZAC) 10 MG capsule Take 10 mg by mouth daily.   folic acid (FOLVITE) 1 MG tablet Take 1 mg by mouth daily.   ibuprofen (ADVIL) 200 MG tablet Take 400 mg by mouth every 6 (six) hours as needed for headache, mild pain or moderate pain.   meclizine (ANTIVERT) 12.5 MG tablet Take 1 tablet (12.5 mg total) by mouth 3 (three) times daily as needed for dizziness. (Patient not taking: Reported on 04/11/2022)   metFORMIN (GLUCOPHAGE-XR) 750 MG 24 hr tablet Take 750 mg by mouth 2 (two) times daily.   OMEGA-3-ACID ETHYL ESTERS PO Take 650 mg by mouth daily.   ondansetron (ZOFRAN) 4 MG tablet Take 1 tablet (4 mg total) by mouth every 8 (eight) hours as needed for nausea or vomiting.   Prasterone, DHEA, (DHEA PO) Take 5 mg by mouth daily.   spironolactone (ALDACTONE) 50 MG tablet Take 1 tablet (50 mg total) by mouth daily.   warfarin (COUMADIN) 5 MG tablet TAKE ONE TABLET BY MOUTH DAILY OR AS DIRECTED BY ANTICOAGULATION CLINIC   No facility-administered encounter medications on file as of 05/06/2022.   REVIEW OF SYSTEMS:  Gen: Denies fever, sweats or chills. No weight loss.  CV: Denies chest pain, palpitations or edema. Resp: Denies cough, shortness of breath of hemoptysis.  GI: Denies heartburn, dysphagia, stomach or lower abdominal pain. No diarrhea or constipation.  GU : Denies urinary burning, blood in urine, increased urinary frequency or incontinence. MS: Denies joint pain, muscles aches or weakness. Derm: Denies rash, itchiness, skin lesions or unhealing ulcers. Psych: Denies depression, anxiety, memory loss or confusion. Heme:  Denies bruising, easy bleeding. Neuro:  Denies headaches, dizziness or paresthesias. Endo:  Denies any problems with DM, thyroid or adrenal function.  PHYSICAL EXAM: LMP 04/11/2022 (Exact Date) Comment: neg preg 3/15 General: in no acute distress. Head: Normocephalic and atraumatic. Eyes:  Sclerae non-icteric, conjunctive pink. Ears: Normal auditory acuity. Mouth: Dentition intact. No ulcers or lesions.  Neck: Supple, no lymphadenopathy or thyromegaly.  Lungs: Clear bilaterally to auscultation without wheezes, crackles or rhonchi. Heart: Regular rate and rhythm. No  murmur, rub or gallop appreciated.  Abdomen: Soft, nontender, nondistended. No masses. No hepatosplenomegaly. Normoactive bowel sounds x 4 quadrants.  Rectal:  Musculoskeletal: Symmetrical with no gross deformities. Skin: Warm and dry. No rash or lesions on visible extremities. Extremities: No edema. Neurological: Alert oriented x 4, no focal deficits.  Psychological:  Alert and cooperative. Normal mood and affect.  ASSESSMENT AND PLAN:  37 year old female with bloody stools.  Positive antiphospholipid antibody on Coumadin.   CVA. Brain MRI 02/12/2022 showed linear focus of restricted diffusion in the medial right thalamus concerning for subacute infarct.     CC:  Allwardt, Crist Infante, PA-C

## 2022-05-06 ENCOUNTER — Ambulatory Visit: Payer: 59

## 2022-05-06 ENCOUNTER — Ambulatory Visit: Payer: 59 | Admitting: Nurse Practitioner

## 2022-05-06 ENCOUNTER — Encounter: Payer: Self-pay | Admitting: Nurse Practitioner

## 2022-05-06 VITALS — BP 118/68 | HR 88 | Ht 66.0 in | Wt 224.0 lb

## 2022-05-06 DIAGNOSIS — Z7901 Long term (current) use of anticoagulants: Secondary | ICD-10-CM | POA: Diagnosis not present

## 2022-05-06 DIAGNOSIS — K625 Hemorrhage of anus and rectum: Secondary | ICD-10-CM

## 2022-05-06 LAB — POCT INR: INR: 1.1 — AB (ref 2.0–3.0)

## 2022-05-06 MED ORDER — ENOXAPARIN SODIUM 150 MG/ML IJ SOSY: 150.0000 mg | PREFILLED_SYRINGE | INTRAMUSCULAR | 0 refills | Status: DC

## 2022-05-06 NOTE — Telephone Encounter (Signed)
Sending msg to PCP for update on warfarin management and INR result today.  INR today was 1.1 (last week INR was 1.2, and the first week it was 1.1).  Pt reports she is using marijuana daily for anxiety but has not used over the last week except for the last two days. Pt has used this daily before the prior two INR results. Pt is aware this interacts with warfarin, increasing warfarin effect. Pt also reports she is using turmeric but has been using that since starting warfarin. This interacts with warfarin and will increase INR. Pt is to start a new antidepressant, trintellix, but has decided to wait on starting it due to its interaction with warfarin.  Questioning pt regarding other things that will interact with warfarin and cause a decreased INR and none have been found.   Pt will continue lovenox injections for another week. Will recheck INR on 5/6. Warfarin dose has been increased by 27%, within protocol guidelines of a 20-30% increase with an INR <1.3

## 2022-05-06 NOTE — Progress Notes (Signed)
Pt taking warfarin with lovenox injections. Continue lovenox injections daily in the AM and increase warfarin dose today to take 1 1/2 tablets and increase dose tomorrow to take 2 1/2 tablets and then change weekly dose to take 1 1/2 tablet daily except take 2 tablets on Mondays and Thursdays and recheck on 5/6 at [place], [address].   Sent in new script for lovenox.  Pt reports she is using marijuana daily for anxiety but has not used over the last week except for the last two days. Pt has used this daily before the prior two INR results.  Pt also reports she is using turmeric but has been using that since starting warfarin. This interacts wit warfarin and will increase INR. Pt is to start a new antidepressant, trintellix, but has decieded to wait on starting it due to its interaction with warfarin.

## 2022-05-06 NOTE — Patient Instructions (Addendum)
Please contact our office at [phone] after you see your cardiologist and neurologist and we will determine an appropriate date to schedule you for a colonoscopy  Benefiber- 1 tablespoon daily  Apply a small amount of Desitin inside the anal opening and to the external anal area three times daily as needed for anal or hemorrhoidal irritation/bleeding.   Contact our office if your rectal bleeding persists or worsens   Thank you for trusting me with your gastrointestinal care!   Alcide Evener, CRNP

## 2022-05-06 NOTE — Patient Instructions (Addendum)
Pre visit review using our clinic review tool, if applicable. No additional management support is needed unless otherwise documented below in the visit note.  Continue lovenox injections daily in the AM and increase warfarin dose today to take 1 1/2 tablets and increase dose tomorrow to take 2 1/2 tablets and then change weekly dose to take 1 1/2 tablet daily except take 2 tablets on Mondays and Thursdays and recheck on 5/6 at [place], [address] [facility].

## 2022-05-06 NOTE — Telephone Encounter (Signed)
Noted and agreed, thank you. 

## 2022-05-08 ENCOUNTER — Other Ambulatory Visit: Payer: Self-pay | Admitting: Physician Assistant

## 2022-05-08 MED ORDER — WARFARIN SODIUM 5 MG PO TABS: ORAL_TABLET | ORAL | 0 refills | Status: DC

## 2022-05-08 NOTE — Telephone Encounter (Signed)
Pt is compliant with warfarin management and PCP apts.  Sent in refill of warfarin to requested pharmacy.      

## 2022-05-09 NOTE — Progress Notes (Signed)
Agree with assessment/plan.  Raj Conchetta Lamia, MD [facility] [phone]  

## 2022-05-10 ENCOUNTER — Other Ambulatory Visit: Payer: Self-pay

## 2022-05-10 ENCOUNTER — Other Ambulatory Visit: Payer: Self-pay | Admitting: Physician Assistant

## 2022-05-10 ENCOUNTER — Inpatient Hospital Stay: Payer: 59 | Attending: Hematology and Oncology

## 2022-05-10 DIAGNOSIS — R760 Raised antibody titer: Secondary | ICD-10-CM

## 2022-05-10 LAB — PROTIME-INR
INR: 1.2 (ref 0.8–1.2)
Prothrombin Time: 14.9 seconds (ref 11.4–15.2)

## 2022-05-10 LAB — CBC WITH DIFFERENTIAL (CANCER CENTER ONLY)
Abs Immature Granulocytes: 0.07 10*3/uL (ref 0.00–0.07)
Basophils Absolute: 0.1 10*3/uL (ref 0.0–0.1)
Basophils Relative: 1 %
Eosinophils Absolute: 0.2 10*3/uL (ref 0.0–0.5)
Eosinophils Relative: 2 %
HCT: 37 % (ref 36.0–46.0)
Hemoglobin: 12.8 g/dL (ref 12.0–15.0)
Immature Granulocytes: 1 %
Lymphocytes Relative: 31 %
Lymphs Abs: 3.2 10*3/uL (ref 0.7–4.0)
MCH: 28.6 pg (ref 26.0–34.0)
MCHC: 34.6 g/dL (ref 30.0–36.0)
MCV: 82.6 fL (ref 80.0–100.0)
Monocytes Absolute: 0.6 10*3/uL (ref 0.1–1.0)
Monocytes Relative: 6 %
Neutro Abs: 6 10*3/uL (ref 1.7–7.7)
Neutrophils Relative %: 59 %
Platelet Count: 233 10*3/uL (ref 150–400)
RBC: 4.48 MIL/uL (ref 3.87–5.11)
RDW: 12.9 % (ref 11.5–15.5)
WBC Count: 10.2 10*3/uL (ref 4.0–10.5)
nRBC: 0 % (ref 0.0–0.2)

## 2022-05-10 LAB — CMP (CANCER CENTER ONLY)
ALT: 30 U/L (ref 0–44)
AST: 16 U/L (ref 15–41)
Albumin: 4.4 g/dL (ref 3.5–5.0)
Alkaline Phosphatase: 89 U/L (ref 38–126)
Anion gap: 6 (ref 5–15)
BUN: 17 mg/dL (ref 6–20)
CO2: 23 mmol/L (ref 22–32)
Calcium: 9.2 mg/dL (ref 8.9–10.3)
Chloride: 107 mmol/L (ref 98–111)
Creatinine: 0.77 mg/dL (ref 0.44–1.00)
GFR, Estimated: >60 mL/min (ref >60)
Glucose, Bld: 92 mg/dL (ref 70–99)
Potassium: 4.2 mmol/L (ref 3.5–5.1)
Sodium: 136 mmol/L (ref 135–145)
Total Bilirubin: 0.3 mg/dL (ref 0.3–1.2)
Total Protein: 7.6 g/dL (ref 6.5–8.1)

## 2022-05-13 ENCOUNTER — Other Ambulatory Visit: Payer: Self-pay | Admitting: Neurology

## 2022-05-13 ENCOUNTER — Encounter: Payer: Self-pay | Admitting: Physician Assistant

## 2022-05-13 ENCOUNTER — Telehealth: Payer: Self-pay

## 2022-05-13 ENCOUNTER — Ambulatory Visit: Payer: 59

## 2022-05-13 ENCOUNTER — Ambulatory Visit: Payer: 59 | Admitting: Physician Assistant

## 2022-05-13 VITALS — BP 118/76 | HR 75 | Temp 97.3°F | Ht 66.0 in | Wt 227.6 lb

## 2022-05-13 DIAGNOSIS — F419 Anxiety disorder, unspecified: Secondary | ICD-10-CM | POA: Diagnosis not present

## 2022-05-13 DIAGNOSIS — Z7901 Long term (current) use of anticoagulants: Secondary | ICD-10-CM

## 2022-05-13 DIAGNOSIS — R760 Raised antibody titer: Secondary | ICD-10-CM

## 2022-05-13 DIAGNOSIS — F32A Depression, unspecified: Secondary | ICD-10-CM | POA: Diagnosis not present

## 2022-05-13 DIAGNOSIS — Z8742 Personal history of other diseases of the female genital tract: Secondary | ICD-10-CM

## 2022-05-13 LAB — POCT INR: INR: 1.1 — AB (ref 2.0–3.0)

## 2022-05-13 MED ORDER — ENOXAPARIN SODIUM 150 MG/ML IJ SOSY
150.0000 mg | PREFILLED_SYRINGE | INTRAMUSCULAR | 0 refills | Status: DC
Start: 2022-05-13 — End: 2022-05-20

## 2022-05-13 MED ORDER — ALPRAZOLAM 0.5 MG PO TABS
ORAL_TABLET | ORAL | 2 refills | Status: AC
Start: 2022-05-13 — End: ?

## 2022-05-13 MED ORDER — ONDANSETRON HCL 4 MG PO TABS
4.0000 mg | ORAL_TABLET | Freq: Three times a day (TID) | ORAL | 2 refills | Status: DC | PRN
Start: 2022-05-13 — End: 2022-09-13

## 2022-05-13 NOTE — Progress Notes (Unsigned)
Subjective:    Patient ID: Kristen Hoover, female    DOB: Jul 12, 1985, 37 y.o.   MRN: [id number]  Chief Complaint  Patient presents with   Medical Management of Chronic Issues    Pt in the office for 1 month f/u before releasing back to work; pt feels like she is ok to go to work but still in a head space; depression is better, and migraines are doing better; pt states her anxiety is on high level and not better, pt wished she was further along in her progress; pt has support and works from home so feels like it will be fine.     HPI Patient is in today for recheck chronic conditions as discussed in last few visits.  FMLA ends tomorrow. Brother's one year death anniversary is tomorrow; pt is going to take rest of week off unpaid and start back to work on 05/20/22. She's doing ok, but her anxiety level is still very high.   After FMLA ends -  Will still need about 4 hours weekly for appointments (still trying to get INR to therapeutic level with coumadin clinic).  Two full days for appointments monthly.   Past Medical History:  Diagnosis Date   Anxiety and depression    Frequency of urination    GERD (gastroesophageal reflux disease)    History of blood clots    History of pancreatitis    12-28-2018-  last episode 2006   PCOS (polycystic ovarian syndrome)    Renal calculus, right    Stroke [facility])    Urgency of urination    Wears glasses     Past Surgical History:  Procedure Laterality Date   BUBBLE STUDY  03/22/2022   Procedure: BUBBLE STUDY;  Surgeon: Lewayne Bunting, MD;  Location: [facility] ENDOSCOPY;  Service: Cardiovascular;;   CYSTOSCOPY WITH RETROGRADE PYELOGRAM, URETEROSCOPY AND STENT PLACEMENT Right 01/05/2019   Procedure: CYSTOSCOPY/URETEROSCOP/STENT PLACEMENT;  Surgeon: Noel Christmas, MD;  Location: [facility];  Service: Urology;  Laterality: Right;  75 MINS   CYSTOSCOPY WITH RETROGRADE PYELOGRAM, URETEROSCOPY AND STENT PLACEMENT Right 02/09/2019    Procedure: CYSTOSCOPY WITH RETROGRADE PYELOGRAM, URETEROSCOPY AND STENT PLACEMENT;  Surgeon: Noel Christmas, MD;  Location: [facility];  Service: Urology;  Laterality: Right;  1 HR   ESOPHAGOGASTRODUODENOSCOPY  2015   HOLMIUM LASER APPLICATION Right 02/09/2019   Procedure: HOLMIUM LASER APPLICATION;  Surgeon: Noel Christmas, MD;  Location: [facility];  Service: Urology;  Laterality: Right;   TEE WITHOUT CARDIOVERSION N/A 03/22/2022   Procedure: TRANSESOPHAGEAL ECHOCARDIOGRAM (TEE);  Surgeon: Lewayne Bunting, MD;  Location: [facility] ENDOSCOPY;  Service: Cardiovascular;  Laterality: N/A;   TONSILLECTOMY  2014   WISDOM TOOTH EXTRACTION  2018    Family History  Problem Relation Age of Onset   Fibromyalgia Mother    Headache Mother    Neuropathy Father    Diabetes Father    Heart failure Father    Diabetes Brother    Colon cancer Paternal Grandfather    Liver disease Neg Hx    Esophageal cancer Neg Hx     Social History   Tobacco Use   Smoking status: Never   Smokeless tobacco: Never  Vaping Use   Vaping Use: Never used  Substance Use Topics   Alcohol use: Not Currently   Drug use: Not Currently    Types: Marijuana    Comment: 12-28-2018  per pt last used 5 wks ago  No Known Allergies  Review of Systems NEGATIVE UNLESS OTHERWISE INDICATED IN HPI      Objective:     BP 118/76 (BP Location: Right Arm, Patient Position: Sitting)   Pulse 75   Temp (!) 97.3 F (36.3 C) (Temporal)   Ht 5\' 6"  (1.676 m)   Wt 227 lb 9.6 oz (103.2 kg)   LMP 04/13/2022 (Approximate)   SpO2 99%   BMI 36.74 kg/m   Wt Readings from Last 3 Encounters:  05/15/22 230 lb (104.3 kg)  05/13/22 227 lb 9.6 oz (103.2 kg)  05/06/22 224 lb (101.6 kg)    BP Readings from Last 3 Encounters:  05/15/22 110/70  05/13/22 118/76  05/06/22 118/68     Physical Exam Vitals and nursing note reviewed.  Constitutional:      Appearance: Normal appearance.  Eyes:      Extraocular Movements: Extraocular movements intact.     Conjunctiva/sclera: Conjunctivae normal.     Pupils: Pupils are equal, round, and reactive to light.  Cardiovascular:     Rate and Rhythm: Normal rate and regular rhythm.     Pulses: Normal pulses.     Heart sounds: Normal heart sounds. No murmur heard. Pulmonary:     Effort: Pulmonary effort is normal.     Breath sounds: Normal breath sounds.  Neurological:     General: No focal deficit present.     Mental Status: She is alert and oriented to person, place, and time.  Psychiatric:        Mood and Affect: Mood normal.        Behavior: Behavior normal.        Assessment & Plan:  Anxiety and depression Assessment & Plan:    05/13/2022    8:03 AM 04/11/2022   10:02 AM 01/30/2022    9:26 AM  GAD 7 : Generalized Anxiety Score  Nervous, Anxious, on Edge 2 1 3   Control/stop worrying 2 1 2   Worry too much - different things 2 1 3   Trouble relaxing 3 2 2   Restless 1 1 0  Easily annoyed or irritable 3 1 3   Afraid - awful might happen 3 2 3   Total GAD 7 Score 16 9 16   Anxiety Difficulty Somewhat difficult Somewhat difficult Very difficult      05/13/2022    8:02 AM 04/11/2022   10:01 AM 04/10/2022    2:06 PM  PHQ9 SCORE ONLY  PHQ-9 Total Score 0 10 7   Pt is hoping to start Trintellix with psychiatrist, but waiting to get INR therapeutic. Anxiety higher than normal lately. Uses marijuana to offset anxiety symptoms. She has Xanax 0.5 mg to use twice daily as needed. PDMP reviewed today, no red flags, filling appropriately. Refilled today. Continues to work with counseling.     Orders: -     Ondansetron HCl; Take 1 tablet (4 mg total) by mouth every 8 (eight) hours as needed for nausea or vomiting.  Dispense: 30 tablet; Refill: 2 -     ALPRAZolam; Take 1/2 to 1 tab po BID prn for acute panic.  Dispense: 30 tablet; Refill: 2  Long term (current) use of anticoagulants [Z79.01] Assessment & Plan: Warfarin dosing per coumadin  clinic for antiphospholipid antibody positive.  Some concern that she is genetically resistant to coumadin.  She may need to follow up with hematology.  Lab Results  Component Value Date   INR 1.1 (A) 05/13/2022   INR 1.2 05/10/2022   INR 1.1 (A) 05/06/2022  Antiphospholipid antibody positive   Work note updated for patient today. Back to work 05/20/22. After FMLA ends -  Will still need about 4 hours weekly for appointments (still trying to get INR to therapeutic level with coumadin clinic).  Two full days for appointments monthly.      Return in about 3 months (around 08/13/2022) for physical, fasting labs .    Jaxxon Naeem M Laquanta Hummel, PA-C

## 2022-05-13 NOTE — Telephone Encounter (Signed)
Form completed in office and given to patient

## 2022-05-13 NOTE — Telephone Encounter (Signed)
Orders Placed This Encounter  Procedures   Insulin, random    Standing Status:   Future    Standing Expiration Date:   11/13/2022   Comprehensive metabolic panel    Standing Status:   Future    Standing Expiration Date:   11/13/2022   Hemoglobin A1c    Standing Status:   Future    Standing Expiration Date:   11/13/2022   Lipid panel    Standing Status:   Future    Standing Expiration Date:   11/13/2022   TSH    Standing Status:   Future    Standing Expiration Date:   11/13/2022   T4, free    Standing Status:   Future    Standing Expiration Date:   11/13/2022

## 2022-05-13 NOTE — Telephone Encounter (Signed)
Pt in for INR check in coumadin clinic. INR is still subtherapeutic at 1.1.  Pt also had INR checked on 5/3, Friday and it was 1.2  Pt has been continuing daily lovenox injections since starting warfarin. Pt has now been taking warfarin for 4 weeks with no movement in INR value.   4/15 INR 1.1 4/19 INR 1.2; an erroneous INR of 2.1 was entered into the chart in error on this date 4/29 INR 1.1 5/3 INR 1.2 5/6 INR 1.1  Wondering if pt has warfarin resistance, whether complete or incomplete, VKORC1 gene polymorphisms or some other factor causing warfarin resistance. Wondering if hematology can help with any testing for warfarin resistance.   Pt started warfarin with 5 mg QD, 35 mg weekly. Dose has been consistently increased each week by 20-30%. Second week 45 mg, third week 60mg , and increase this week makes warfarin dosing 72.5 mg.  Pt also wanted a plan concerning when she can start taking vortioxetine (Trintellix). This interacts with warfarin and will increase risk of bleeding so discussed with pt at last apt to hold off on starting it until her INR is in range. Pt asked again today about starting the medication. Advised this will be discussed with PCP concerning starting this medication. Pt verbalized understanding.

## 2022-05-13 NOTE — Patient Instructions (Addendum)
Pre visit review using our clinic review tool, if applicable. No additional management support is needed unless otherwise documented below in the visit note.  Continue lovenox injections daily in the AM and increase warfarin dose today to take 2 1/2 tablets and increase dose tomorrow to take 2 1/2 tablets and then change weekly dose to take 2 tablets daily and recheck on 5/13 at [place], [address].

## 2022-05-13 NOTE — Progress Notes (Signed)
Pt had apt with PCP this morning so no charge is added for management. Pt taking warfarin with lovenox injections. Continue lovenox injections daily in the AM and increase warfarin dose today to take 2 1/2 tablets and increase dose tomorrow to take 2 1/2 tablets and then change weekly dose to take 2 tablets daily and recheck on 5/13 at [place], [address].  Pt denies any bleeding. Advised if any s/s of abnormal bruising or bleeding to go to the ER or contact the office. Pt verbalized understanding.   Pt also had INR tested on 5/3 by hematology and result was 1.2  Sent msg to PCP concerning warfarin resistance.   Sent in lovenox.

## 2022-05-14 ENCOUNTER — Other Ambulatory Visit: Payer: Self-pay

## 2022-05-14 ENCOUNTER — Other Ambulatory Visit (INDEPENDENT_AMBULATORY_CARE_PROVIDER_SITE_OTHER): Payer: 59

## 2022-05-14 DIAGNOSIS — Z8742 Personal history of other diseases of the female genital tract: Secondary | ICD-10-CM

## 2022-05-14 LAB — LIPID PANEL
Cholesterol: 130 mg/dL (ref 0–200)
HDL: 40.8 mg/dL (ref >39.00)
LDL Cholesterol: 73 mg/dL (ref 0–99)
NonHDL: 89.48
Total CHOL/HDL Ratio: 3
Triglycerides: 80 mg/dL (ref 0.0–149.0)
VLDL: 16 mg/dL (ref 0.0–40.0)

## 2022-05-14 LAB — COMPREHENSIVE METABOLIC PANEL
ALT: 27 U/L (ref 0–35)
AST: 17 U/L (ref 0–37)
Albumin: 4 g/dL (ref 3.5–5.2)
Alkaline Phosphatase: 86 U/L (ref 39–117)
BUN: 14 mg/dL (ref 6–23)
CO2: 27 mEq/L (ref 19–32)
Calcium: 9.2 mg/dL (ref 8.4–10.5)
Chloride: 105 mEq/L (ref 96–112)
Creatinine, Ser: 0.8 mg/dL (ref 0.40–1.20)
GFR: 94.1 mL/min (ref >60.00)
Glucose, Bld: 95 mg/dL (ref 70–99)
Potassium: 4.5 mEq/L (ref 3.5–5.1)
Sodium: 138 mEq/L (ref 135–145)
Total Bilirubin: 0.3 mg/dL (ref 0.2–1.2)
Total Protein: 7.2 g/dL (ref 6.0–8.3)

## 2022-05-14 LAB — HEMOGLOBIN A1C: Hgb A1c MFr Bld: 5 % (ref 4.6–6.5)

## 2022-05-14 LAB — T4, FREE: Free T4: 0.74 ng/dL (ref 0.60–1.60)

## 2022-05-14 LAB — TSH: TSH: 2.9 u[IU]/mL (ref 0.35–5.50)

## 2022-05-14 MED ORDER — WARFARIN SODIUM 5 MG PO TABS: ORAL_TABLET | ORAL | 0 refills | Status: DC

## 2022-05-14 NOTE — Telephone Encounter (Signed)
Pharmacy faxed request for refill for patient for 90 day supply. Please advise

## 2022-05-15 ENCOUNTER — Ambulatory Visit: Payer: 59 | Admitting: "Endocrinology

## 2022-05-15 ENCOUNTER — Encounter: Payer: Self-pay | Admitting: "Endocrinology

## 2022-05-15 VITALS — BP 110/70 | HR 83 | Ht 66.0 in | Wt 230.0 lb

## 2022-05-15 DIAGNOSIS — Z7901 Long term (current) use of anticoagulants: Secondary | ICD-10-CM | POA: Insufficient documentation

## 2022-05-15 DIAGNOSIS — Z6837 Body mass index (BMI) 37.0-37.9, adult: Secondary | ICD-10-CM | POA: Diagnosis not present

## 2022-05-15 DIAGNOSIS — E7800 Pure hypercholesterolemia, unspecified: Secondary | ICD-10-CM | POA: Diagnosis not present

## 2022-05-15 DIAGNOSIS — E282 Polycystic ovarian syndrome: Secondary | ICD-10-CM | POA: Diagnosis not present

## 2022-05-15 LAB — INSULIN, RANDOM: Insulin: 24.5 u[IU]/mL — ABNORMAL HIGH

## 2022-05-15 NOTE — Assessment & Plan Note (Signed)
Warfarin dosing per coumadin clinic for antiphospholipid antibody positive.  Some concern that she is genetically resistant to coumadin.  She may need to follow up with hematology.  Lab Results  Component Value Date   INR 1.1 (A) 05/13/2022   INR 1.2 05/10/2022   INR 1.1 (A) 05/06/2022

## 2022-05-15 NOTE — Assessment & Plan Note (Signed)
    05/13/2022    8:03 AM 04/11/2022   10:02 AM 01/30/2022    9:26 AM  GAD 7 : Generalized Anxiety Score  Nervous, Anxious, on Edge 2 1 3   Control/stop worrying 2 1 2   Worry too much - different things 2 1 3   Trouble relaxing 3 2 2   Restless 1 1 0  Easily annoyed or irritable 3 1 3   Afraid - awful might happen 3 2 3   Total GAD 7 Score 16 9 16   Anxiety Difficulty Somewhat difficult Somewhat difficult Very difficult      05/13/2022    8:02 AM 04/11/2022   10:01 AM 04/10/2022    2:06 PM  PHQ9 SCORE ONLY  PHQ-9 Total Score 0 10 7   Pt is hoping to start Trintellix with psychiatrist, but waiting to get INR therapeutic. Anxiety higher than normal lately. Uses marijuana to offset anxiety symptoms. She has Xanax 0.5 mg to use twice daily as needed. PDMP reviewed today, no red flags, filling appropriately. Refilled today. Continues to work with counseling.

## 2022-05-15 NOTE — Progress Notes (Signed)
Outpatient Endocrinology Note [place], MD    Zyon Cozzens [facility] 1985/09/17 [id number]  Referring Provider: Allwardt, Crist Infante, PA-C Primary Care Provider: Allwardt, Crist Infante, PA-C Reason for consultation: Subjective   Assessment & Plan  Diagnoses and all orders for this visit:  PCOS (polycystic ovarian syndrome)  Class 2 severe obesity due to excess calories with serious comorbidity and body mass index (BMI) of 37.0 to 37.9 in adult [facility])  Pure hypercholesterolemia   PCOS well controlled and managed by another provider Labs WNL On Spironolactone 50 mg qd and Metformin XR 750 mg bid Discussed role of Metformin 1000 mg BID Pt interested in current treatment   Pt interested in weight loss Discussed lifestyle changes, medical management as well as bariatric surgery Maintain healthy lifestyle including 1200 Cal/day, 30 min of activity/day, avoiding refined/processed/outside food 20 minutes physical activity per day, in continuum or interruptedly through the day  Goals: less than 60 grams of carbohydrate/meal, 1200-1500 Cal/day, 10,0000 steps a day and weight loss of 0.5-1 lb/ wk  Adequate 7+ hours of rest, de-stressing on everyday basis Discussed extensively dietary sources to add more protein Calorie counting apps and weekly self weight log manintenance   Pt started atorvastatin 40 mg qd two months ago by another provider LDL/cholesterol well controlled now  No follow-ups on file. Follow with PCP  I have reviewed current medications, nurse's notes, allergies, vital signs, past medical and surgical history, family medical history, and social history for this encounter. Counseled patient on symptoms, examination findings, lab findings, imaging results, treatment decisions and monitoring and prognosis. The patient understood the recommendations and agrees with the treatment plan. All questions regarding treatment plan were fully answered.  Alt[place], MD   05/15/22   History of Present Illness HPI  Kristen Hoover is a 37 y.o. year old female who presents for evaluation of PCOS Marinah Lovel was first diagnosed of PCOS in 2007.    Reports regular periods now without treatment, used to have irregular at one point Reports excess hair on chin, manages with tweezing/shaving, laser in past Started atorvastatin 40 mg qd two months ago  Current regimen: Spironolactone 50 mg qd Metformin XR 750 mg bid    Physical Exam  BP 110/70 (BP Location: Left Arm, Patient Position: Sitting, Cuff Size: Normal)   Pulse 83   Ht 5\' 6"  (1.676 m)   Wt 230 lb (104.3 kg)   LMP 04/13/2022 (Approximate)   SpO2 98%   BMI 37.12 kg/m    Constitutional: well developed, well nourished Head: normocephalic, atraumatic Eyes: sclera anicteric, no redness Neck: supple, no significant hair noted Lungs: normal respiratory effort Neurology: alert and oriented Skin: dry, no appreciable rashes Musculoskeletal: no appreciable defects Psychiatric: normal mood and affect   Current Medications Patient's Medications  New Prescriptions   No medications on file  Previous Medications   ALPRAZOLAM (XANAX) 0.5 MG TABLET    Take 1/2 to 1 tab po BID prn for acute panic.   ASPIRIN 81 MG CHEWABLE TABLET    Chew 81 mg by mouth daily.   ATORVASTATIN (LIPITOR) 40 MG TABLET    TAKE 1 TABLET BY MOUTH EVERY DAY   CALCIUM CARBONATE (TUMS - DOSED IN MG ELEMENTAL CALCIUM) 500 MG CHEWABLE TABLET    Chew 2 tablets by mouth daily as needed for indigestion or heartburn.   ENOXAPARIN (LOVENOX) 150 MG/ML INJECTION    Inject 1 mL (150 mg total) into the skin daily. TAKE AS DIRECTED BY  ANTICOAGULATION CLINIC EVERY MORNING   ESCITALOPRAM (LEXAPRO) 10 MG TABLET    Take 10 mg by mouth daily.   FOLIC ACID (FOLVITE) 1 MG TABLET    Take 1 mg by mouth daily.   METFORMIN (GLUCOPHAGE-XR) 750 MG 24 HR TABLET    Take 750 mg by mouth 2 (two) times daily.   OMEGA-3-ACID ETHYL ESTERS PO     Take 650 mg by mouth daily.   ONDANSETRON (ZOFRAN) 4 MG TABLET    Take 1 tablet (4 mg total) by mouth every 8 (eight) hours as needed for nausea or vomiting.   PRASTERONE, DHEA, (DHEA PO)    Take 5 mg by mouth daily.   SPIRONOLACTONE (ALDACTONE) 50 MG TABLET    Take 1 tablet (50 mg total) by mouth daily.   WARFARIN (COUMADIN) 5 MG TABLET    TAKE 1 1/2  TABLETS BY MOUTH DAILY EXCEPT TAKE 2 TABLETS ON MONDAYS AND THURSDAYS OR AS DIRECTED BY ANTICOAGULATION CLINIC  Modified Medications   No medications on file  Discontinued Medications   No medications on file    Allergies No Known Allergies  Past Medical History Past Medical History:  Diagnosis Date   Anxiety and depression    Frequency of urination    GERD (gastroesophageal reflux disease)    History of blood clots    History of pancreatitis    12-28-2018-  last episode 2006   PCOS (polycystic ovarian syndrome)    Renal calculus, right    Stroke [facility])    Urgency of urination    Wears glasses     Past Surgical History Past Surgical History:  Procedure Laterality Date   BUBBLE STUDY  03/22/2022   Procedure: BUBBLE STUDY;  Surgeon: Lewayne Bunting, MD;  Location: [facility] ENDOSCOPY;  Service: Cardiovascular;;   CYSTOSCOPY WITH RETROGRADE PYELOGRAM, URETEROSCOPY AND STENT PLACEMENT Right 01/05/2019   Procedure: CYSTOSCOPY/URETEROSCOP/STENT PLACEMENT;  Surgeon: Noel Christmas, MD;  Location: [facility];  Service: Urology;  Laterality: Right;  75 MINS   CYSTOSCOPY WITH RETROGRADE PYELOGRAM, URETEROSCOPY AND STENT PLACEMENT Right 02/09/2019   Procedure: CYSTOSCOPY WITH RETROGRADE PYELOGRAM, URETEROSCOPY AND STENT PLACEMENT;  Surgeon: Noel Christmas, MD;  Location: [facility];  Service: Urology;  Laterality: Right;  1 HR   ESOPHAGOGASTRODUODENOSCOPY  2015   HOLMIUM LASER APPLICATION Right 02/09/2019   Procedure: HOLMIUM LASER APPLICATION;  Surgeon: Noel Christmas, MD;  Location: [facility];  Service: Urology;  Laterality: Right;   TEE WITHOUT CARDIOVERSION N/A 03/22/2022   Procedure: TRANSESOPHAGEAL ECHOCARDIOGRAM (TEE);  Surgeon: Lewayne Bunting, MD;  Location: [facility] ENDOSCOPY;  Service: Cardiovascular;  Laterality: N/A;   TONSILLECTOMY  2014   WISDOM TOOTH EXTRACTION  2018    Family History family history includes Colon cancer in her paternal grandfather; Diabetes in her brother and father; Fibromyalgia in her mother; Headache in her mother; Heart failure in her father; Neuropathy in her father.  Social History Social History   Socioeconomic History   Marital status: Married    Spouse name: Not on file   Number of children: 0   Years of education: Not on file   Highest education level: Bachelor's degree (e.g., BA, AB, BS)  Occupational History   Occupation: Engineer, agricultural  Tobacco Use   Smoking status: Never   Smokeless tobacco: Never  Vaping Use   Vaping Use: Never used  Substance and Sexual Activity   Alcohol use: Not Currently   Drug use: Not Currently  Types: Marijuana    Comment: 12-28-2018  per pt last used 5 wks ago   Sexual activity: Not on file    Comment: husband had vasectomy  Other Topics Concern   Not on file  Social History Narrative   Are you right handed or left handed? Right    Are you currently employed ?    What is your current occupation?   Do you live at home alone?   Who lives with you?    What type of home do you live in: 1 story or 2 story? 2       Social Determinants of Health   Financial Resource Strain: Low Risk  (05/13/2022)   Overall Financial Resource Strain (CARDIA)    Difficulty of Paying Living Expenses: Not hard at all  Food Insecurity: No Food Insecurity (05/13/2022)   Hunger Vital Sign    Worried About Running Out of Food in the Last Year: Never true    Ran Out of Food in the Last Year: Never true  Transportation Needs: No Transportation Needs (05/13/2022)   PRAPARE - Scientist, research (physical sciences) (Medical): No    Lack of Transportation (Non-Medical): No  Physical Activity: Sufficiently Active (05/13/2022)   Exercise Vital Sign    Days of Exercise per Week: 5 days    Minutes of Exercise per Session: 30 min  Stress: Stress Concern Present (05/13/2022)   [employer] of Occupational Health - Occupational Stress Questionnaire    Feeling of Stress : To some extent  Social Connections: Moderately Isolated (05/13/2022)   Social Connection and Isolation Panel [NHANES]    Frequency of Communication with Friends and Family: Three times a week    Frequency of Social Gatherings with Friends and Family: Once a week    Attends Religious Services: Never    Database administrator or Organizations: No    Attends Engineer, structural: Not on file    Marital Status: Married  Catering manager Violence: Not At Risk (04/10/2022)   Humiliation, Afraid, Rape, and Kick questionnaire    Fear of Current or Ex-Partner: No    Emotionally Abused: No    Physically Abused: No    Sexually Abused: No   Component     Latest Ref Rng 05/14/2022  T4,Free(Direct)     0.60 - 1.60 ng/dL 3.08   TSH     6.57 - 8.46 uIU/mL 2.90     Lab Results  Component Value Date   CHOL 130 05/14/2022   Lab Results  Component Value Date   HDL 40.80 05/14/2022   Lab Results  Component Value Date   LDLCALC 73 05/14/2022   Lab Results  Component Value Date   TRIG 80.0 05/14/2022   Lab Results  Component Value Date   CHOLHDL 3 05/14/2022   Lab Results  Component Value Date   CREATININE 0.80 05/14/2022   Lab Results  Component Value Date   GFR 94.10 05/14/2022      Component Value Date/Time   NA 138 05/14/2022 0856   NA 142 03/18/2022 1438   K 4.5 05/14/2022 0856   CL 105 05/14/2022 0856   CO2 27 05/14/2022 0856   GLUCOSE 95 05/14/2022 0856   BUN 14 05/14/2022 0856   BUN 10 03/18/2022 1438   CREATININE 0.80 05/14/2022 0856   CREATININE 0.77 05/10/2022 0900   CALCIUM 9.2 05/14/2022  0856   PROT 7.2 05/14/2022 0856   ALBUMIN 4.0 05/14/2022 0856   AST  17 05/14/2022 0856   AST 16 05/10/2022 0900   ALT 27 05/14/2022 0856   ALT 30 05/10/2022 0900   ALKPHOS 86 05/14/2022 0856   BILITOT 0.3 05/14/2022 0856   BILITOT 0.3 05/10/2022 0900   GFRNONAA >60 05/10/2022 0900   GFRAA >60 03/12/2018 1702      Latest Ref Rng & Units 05/14/2022    8:56 AM 05/10/2022    9:00 AM 03/18/2022    2:38 PM  BMP  Glucose 70 - 99 mg/dL 95  92  75   BUN 6 - 23 mg/dL 14  17  10    Creatinine 0.40 - 1.20 mg/dL 1.61  0.96  0.45   BUN/Creat Ratio 9 - 23   12   Sodium 135 - 145 mEq/L 138  136  142   Potassium 3.5 - 5.1 mEq/L 4.5  4.2  4.1   Chloride 96 - 112 mEq/L 105  107  104   CO2 19 - 32 mEq/L 27  23  23    Calcium 8.4 - 10.5 mg/dL 9.2  9.2  9.5        Component Value Date/Time   WBC 10.2 05/10/2022 0900   WBC 8.5 01/11/2021 0815   RBC 4.48 05/10/2022 0900   HGB 12.8 05/10/2022 0900   HGB 12.6 03/18/2022 1438   HCT 37.0 05/10/2022 0900   HCT 35.8 03/18/2022 1438   PLT 233 05/10/2022 0900   PLT 267 03/18/2022 1438   MCV 82.6 05/10/2022 0900   MCV 84 03/18/2022 1438   MCH 28.6 05/10/2022 0900   MCHC 34.6 05/10/2022 0900   RDW 12.9 05/10/2022 0900   RDW 13.1 03/18/2022 1438   LYMPHSABS 3.2 05/10/2022 0900   MONOABS 0.6 05/10/2022 0900   EOSABS 0.2 05/10/2022 0900   BASOSABS 0.1 05/10/2022 0900     Parts of this note may have been dictated using voice recognition software. There may be variances in spelling and vocabulary which are unintentional. Not all errors are proofread. Please notify the Thereasa Parkin if any discrepancies are noted or if the meaning of any statement is not clear.

## 2022-05-20 ENCOUNTER — Encounter: Payer: Self-pay | Admitting: Physician Assistant

## 2022-05-20 ENCOUNTER — Ambulatory Visit: Payer: 59

## 2022-05-20 DIAGNOSIS — Z7901 Long term (current) use of anticoagulants: Secondary | ICD-10-CM

## 2022-05-20 LAB — POCT INR: INR: 1.3 — AB (ref 2.0–3.0)

## 2022-05-20 MED ORDER — ENOXAPARIN SODIUM 150 MG/ML IJ SOSY
150.0000 mg | PREFILLED_SYRINGE | INTRAMUSCULAR | 0 refills | Status: DC
Start: 2022-05-20 — End: 2022-06-20

## 2022-05-20 NOTE — Progress Notes (Cosign Needed Addendum)
PCP consulted hematology for warfarin resistance. Response below from Georga Kaufmann T, PA-C     Hi Alyssa,  I discussed the case with Dr. Candise Che (another hematologist) since Dr. Leonides Schanz is out of the office this week. Dr. Candise Che would recommend to continue to titrate her dose up before consideration of warfarin resistance. Making sure patient is compliant with her diet. He recommends considering VKORC1 gene testing if she is dosing around 30 mg per day without reaching therapeutic range.   Pt taking warfarin with lovenox injections. Continue lovenox injections daily in the AM and increase warfarin dose today to take 2 1/2 tablets and increase dose tomorrow to take 4 tablets and then change weekly dose to take 3 tablets daily except take 2 tablets on Mondays, Wednesdays and Fridays. Recheck on 5/20 at [place], [address].  Sent in lovenox injections. Pt also inquired when it will be ok to start Trintellix. She had apt with psychiatrist and is aware pt has not started yet but has also stopped taking Lexapro due to end of prescription because she was to start Trintellix. Psychiatrist is also aware of difficulty finding therapeutic warfarin dose, but would like pt to start as soon as safely possible. Advised a msg will be sent to PCP requesting instructions for starting Trintellix.

## 2022-05-20 NOTE — Telephone Encounter (Signed)
Noted, appreciate the help.

## 2022-05-20 NOTE — Telephone Encounter (Signed)
Allwardt, Alyssa M, PA-C  You1 hour ago (1:37 PM)    Yes, I think she should go ahead and start the Trintellix.     Left HIPAA compliant VM advising of provider msg concerning medication.

## 2022-05-20 NOTE — Patient Instructions (Addendum)
Pre visit review using our clinic review tool, if applicable. No additional management support is needed unless otherwise documented below in the visit note.  Continue lovenox injections daily in the AM and increase warfarin dose today to take 2 1/2 tablets and increase dose tomorrow to take 4 tablets and then change weekly dose to take 3 tablets daily except take 2 tablets on Mondays, Wednesdays and Fridays. Recheck on 5/20 at [place], [address].

## 2022-05-20 NOTE — Telephone Encounter (Signed)
Pt in today for INR check and it was 1.3  Warfarin dosing was increased and pt will continue lovenox injections until recheck next week.   Pt also inquired again when it will be ok to start Trintellix. She had apt with psychiatrist and is aware pt has not started yet but has also stopped taking Lexapro due to end of prescription because she was to start Trintellix. Psychiatrist is also aware of difficulty finding therapeutic warfarin dose, but would like pt to start as soon as safely possible. Advised a msg will be sent to PCP requesting instructions for starting Trintellix.

## 2022-05-27 ENCOUNTER — Ambulatory Visit: Payer: 59

## 2022-05-27 ENCOUNTER — Other Ambulatory Visit: Payer: Self-pay | Admitting: Physician Assistant

## 2022-05-27 DIAGNOSIS — Z7901 Long term (current) use of anticoagulants: Secondary | ICD-10-CM

## 2022-05-27 LAB — POCT INR: INR: 1.8 — AB (ref 2.0–3.0)

## 2022-05-27 NOTE — Progress Notes (Signed)
Pt has started Trintellix and reports some daily nausea. She is using Zofran for nausea. She was advised by prescribing provider that nausea is common for the first couple of weeks. She is eating before taking the tablet and that is helping. Stop lovenox injections. Increase dose today to take 3 tablets and then change weekly dose to take 3 tablets daily except take 2 tablets on Mondays and Fridays. Recheck on 6/3 at [place], [address].   Sent msg to PCP for update of pt's INR and advisement to stop lovenox injections.

## 2022-05-27 NOTE — Telephone Encounter (Signed)
Contacted pt and advised to continue instructions given this morning in coumadin clinic. Advised if any concerns to contact office. Pt verbalized understanding.

## 2022-05-27 NOTE — Telephone Encounter (Signed)
Pt in today for INR check and it is 1.8  Pt has started Trintellix and reports some daily nausea. She is using Zofran for nausea. She was advised by prescribing provider that nausea is common for the first couple of weeks. She is eating before taking the tablet and that is helping. Pt was advised it is ok to stop lovenox injections and a change in her warfarin dosing has been made. No coumadin clinic nurse in the office next week. Do think ok to recheck INR in 2 weeks.  Advised pt is she is not comfortable waiting two weeks to contact the coumadin clinic and a lab INR could be performed and sent to PCP. Pt reports she is ok waiting 2 weeks for next INR check. Advised pt a msg would be sent to PCP and if any changes this nurse will f/u with her after msg is received from PCP.  New dosing from visit this morning; Stop lovenox injections. Increase dose today to take 3 tablets and then change weekly dose to take 3 tablets daily except take 2 tablets on Mondays and Fridays. Recheck on 6/3

## 2022-05-27 NOTE — Patient Instructions (Addendum)
Pre visit review using our clinic review tool, if applicable. No additional management support is needed unless otherwise documented below in the visit note.  Stop lovenox injections. Increase dose today to take 3 tablets and then change weekly dose to take 3 tablets daily except take 2 tablets on Mondays and Fridays. Recheck on 6/3 at [place], [address].

## 2022-05-28 MED ORDER — WARFARIN SODIUM 5 MG PO TABS: ORAL_TABLET | ORAL | 0 refills | Status: DC

## 2022-06-02 ENCOUNTER — Other Ambulatory Visit: Payer: Self-pay | Admitting: Physician Assistant

## 2022-06-03 NOTE — Progress Notes (Signed)
Cardiology Office Note:    Date:  06/05/2022   ID:  Kristen Hoover, DOB 1985/07/26, MRN [id number]  PCP:  Allwardt, Crist Infante, PA-C   [facility] Providers Cardiologist: Ammaar Encina    Referring MD: Bary Leriche, PA-C   Chief Complaint  Patient presents with   Chest Pain   Stroke Symptoms     History of Present Illness:    Kristen Hoover is a 37 y.o. female with a hx of stroke, HLD , PCOS  Seen with husband [name]    Had a migraine HA in Dec.   It lasted for 2-3 months Nausea, double vision,  CT shows several infarcts  Is going to see hematology .   Has stopped smoking cannabis ( smoked daily until her  stroke )   No hx of blood clots previously  No current neurological deficits Hx of PCOS   Was sedentary until recently ,  now is walking about a mile a day    Jun 05, 2022  Kristen Hoover is seen for follow up of her hx of strokes She has a hx of migrain headaches. CT scan of the brain revealed several strokes. Was seen by Hem/ Onc and was found to have Antiphospholipid antibodies  Notes from Hem/Onc. Hematological/Oncological History # Elevated APS Antibodies in Setting of CVA 02/12/2022: MR Brain showed linear focus of restricted diffusion in the medial right thalamus concerning for subacute infarct 02/15/2022: Hypercoagulation panel showed Beta 2 glycoprotein IgG of 77, Anticardiolipin of 40. All other were WNL.  04/10/2022: establish care with Dr. Leonides Schanz   TEE on March 22, 2022 Normal LV function  The intraatrial septum was aneurimsal and had + bubble study in 3-6 cardiac cycles suggestive of interatrial shunting   Is on  warfarin,  INR is managed by primary care .    Past Medical History:  Diagnosis Date   Anxiety and depression    Frequency of urination    GERD (gastroesophageal reflux disease)    History of blood clots    History of pancreatitis    12-28-2018-  last episode 2006   PCOS (polycystic ovarian syndrome)    Renal  calculus, right    Stroke [facility])    Urgency of urination    Wears glasses     Past Surgical History:  Procedure Laterality Date   BUBBLE STUDY  03/22/2022   Procedure: BUBBLE STUDY;  Surgeon: Lewayne Bunting, MD;  Location: [facility] ENDOSCOPY;  Service: Cardiovascular;;   CYSTOSCOPY WITH RETROGRADE PYELOGRAM, URETEROSCOPY AND STENT PLACEMENT Right 01/05/2019   Procedure: CYSTOSCOPY/URETEROSCOP/STENT PLACEMENT;  Surgeon: Noel Christmas, MD;  Location: [facility];  Service: Urology;  Laterality: Right;  75 MINS   CYSTOSCOPY WITH RETROGRADE PYELOGRAM, URETEROSCOPY AND STENT PLACEMENT Right 02/09/2019   Procedure: CYSTOSCOPY WITH RETROGRADE PYELOGRAM, URETEROSCOPY AND STENT PLACEMENT;  Surgeon: Noel Christmas, MD;  Location: [facility];  Service: Urology;  Laterality: Right;  1 HR   ESOPHAGOGASTRODUODENOSCOPY  2015   HOLMIUM LASER APPLICATION Right 02/09/2019   Procedure: HOLMIUM LASER APPLICATION;  Surgeon: Noel Christmas, MD;  Location: [facility];  Service: Urology;  Laterality: Right;   TEE WITHOUT CARDIOVERSION N/A 03/22/2022   Procedure: TRANSESOPHAGEAL ECHOCARDIOGRAM (TEE);  Surgeon: Lewayne Bunting, MD;  Location: [facility] ENDOSCOPY;  Service: Cardiovascular;  Laterality: N/A;   TONSILLECTOMY  2014   WISDOM TOOTH EXTRACTION  2018    Current Medications: Current Meds  Medication Sig   ALPRAZolam (XANAX) 0.5 MG  tablet Take 1/2 to 1 tab po BID prn for acute panic.   aspirin 81 MG chewable tablet Chew 81 mg by mouth daily.   atorvastatin (LIPITOR) 40 MG tablet TAKE 1 TABLET BY MOUTH EVERY DAY   calcium carbonate (TUMS - DOSED IN MG ELEMENTAL CALCIUM) 500 MG chewable tablet Chew 2 tablets by mouth daily as needed for indigestion or heartburn.   folic acid (FOLVITE) 1 MG tablet Take 1 mg by mouth daily.   metFORMIN (GLUCOPHAGE-XR) 750 MG 24 hr tablet Take 750 mg by mouth 2 (two) times daily.   OMEGA-3-ACID ETHYL ESTERS PO Take 650 mg by mouth  daily.   ondansetron (ZOFRAN) 4 MG tablet Take 1 tablet (4 mg total) by mouth every 8 (eight) hours as needed for nausea or vomiting.   Prasterone, DHEA, (DHEA PO) Take 5 mg by mouth daily.   spironolactone (ALDACTONE) 50 MG tablet Take 1 tablet (50 mg total) by mouth daily.   Vortioxetine HBr (TRINTELLIX PO) Take by mouth.   warfarin (COUMADIN) 5 MG tablet TAKE 1 1/2  TABLETS BY MOUTH DAILY EXCEPT TAKE 2 TABLETS ON MONDAYS AND THURSDAYS OR AS DIRECTED BY ANTICOAGULATION CLINIC     Allergies:   Patient has no known allergies.   Social History   Socioeconomic History   Marital status: Married    Spouse name: Not on file   Number of children: 0   Years of education: Not on file   Highest education level: Bachelor's degree (e.g., BA, AB, BS)  Occupational History   Occupation: Engineer, agricultural  Tobacco Use   Smoking status: Never   Smokeless tobacco: Never  Vaping Use   Vaping Use: Never used  Substance and Sexual Activity   Alcohol use: Not Currently   Drug use: Not Currently    Types: Marijuana    Comment: 12-28-2018  per pt last used 5 wks ago   Sexual activity: Not on file    Comment: husband had vasectomy  Other Topics Concern   Not on file  Social History Narrative   Are you right handed or left handed? Right    Are you currently employed ?    What is your current occupation?   Do you live at home alone?   Who lives with you?    What type of home do you live in: 1 story or 2 story? 2       Social Determinants of Health   Financial Resource Strain: Low Risk  (05/13/2022)   Overall Financial Resource Strain (CARDIA)    Difficulty of Paying Living Expenses: Not hard at all  Food Insecurity: No Food Insecurity (05/13/2022)   Hunger Vital Sign    Worried About Running Out of Food in the Last Year: Never true    Ran Out of Food in the Last Year: Never true  Transportation Needs: No Transportation Needs (05/13/2022)   PRAPARE - Administrator, Civil Service  (Medical): No    Lack of Transportation (Non-Medical): No  Physical Activity: Sufficiently Active (05/13/2022)   Exercise Vital Sign    Days of Exercise per Week: 5 days    Minutes of Exercise per Session: 30 min  Stress: Stress Concern Present (05/13/2022)   [employer] of Occupational Health - Occupational Stress Questionnaire    Feeling of Stress : To some extent  Social Connections: Moderately Isolated (05/13/2022)   Social Connection and Isolation Panel [NHANES]    Frequency of Communication with Friends and Family: Three times  a week    Frequency of Social Gatherings with Friends and Family: Once a week    Attends Religious Services: Never    Database administrator or Organizations: No    Attends Engineer, structural: Not on file    Marital Status: Married     Family History: The patient's family history includes Colon cancer in her paternal grandfather; Diabetes in her brother and father; Fibromyalgia in her mother; Headache in her mother; Heart failure in her father; Neuropathy in her father. There is no history of Liver disease or Esophageal cancer.  ROS:   Please see the history of present illness.     All other systems reviewed and are negative.  EKGs/Labs/Other Studies Reviewed:    The following studies were reviewed today:   EKG: Jun 05, 2022 NSR at 72.  Normal   Recent Labs: 05/10/2022: Hemoglobin 12.8; Platelet Count 233 05/14/2022: ALT 27; BUN 14; Creatinine, Ser 0.80; Potassium 4.5; Sodium 138; TSH 2.90  Recent Lipid Panel    Component Value Date/Time   CHOL 130 05/14/2022 0856   TRIG 80.0 05/14/2022 0856   HDL 40.80 05/14/2022 0856   CHOLHDL 3 05/14/2022 0856   VLDL 16.0 05/14/2022 0856   LDLCALC 73 05/14/2022 0856     Risk Assessment/Calculations:            Physical Exam:    Physical Exam: Blood pressure 112/78, pulse 77, height 5\' 6"  (1.676 m), weight 228 lb (103.4 kg), last menstrual period 04/13/2022, SpO2 98 %.       GEN:   Well nourished, well developed in no acute distress HEENT: Normal NECK: No JVD; No carotid bruits LYMPHATICS: No lymphadenopathy CARDIAC: RRR , no murmurs, rubs, gallops RESPIRATORY:  Clear to auscultation without rales, wheezing or rhonchi  ABDOMEN: Soft, non-tender, non-distended MUSCULOSKELETAL:  No edema; No deformity  SKIN: Warm and dry NEUROLOGIC:  Alert and oriented x 3   ASSESSMENT:    1. Cerebrovascular accident (CVA), unspecified mechanism (HCC)   2. Patent foramen ovale     PLAN:       Stroke: Wynona Canes has been found to have a fenestrated atrial septum and also has found to have a quite hypercoagulable syndrome including antiphospholipid antibody.  I suspect this is the cause of her stroke.  At this point I do not think that there is any need to consider closing her fenestrated atrial septal defect/PFO.  She is on warfarin and will need to be on warfarin for the rest of her life from my standpoint.  We offered to see her yearly versus as needed.  Will see her on an as-needed basis.  2.  Obesity: She has a history of PCOS.  Strong encouraged her to work on weight loss.  Weight loss will certainly help with her right to left shunting.        Shared Decision Making/Informed Consent{   The risks [esophageal damage, perforation (1:10,000 risk), bleeding, pharyngeal hematoma as well as other potential complications associated with conscious sedation including aspiration, arrhythmia, respiratory failure and death], benefits (treatment guidance and diagnostic support) and alternatives of a transesophageal echocardiogram were discussed in detail with Ms. Hippert and she is willing to proceed.     Medication Adjustments/Labs and Tests Ordered: Current medicines are reviewed at length with the patient today.  Concerns regarding medicines are outlined above.  Orders Placed This Encounter  Procedures   EKG 12-Lead   No orders of the defined types were placed in this  encounter.   Patient Instructions  Medication Instructions:  Your physician recommends that you continue on your current medications as directed. Please refer to the Current Medication list given to you today.  *If you need a refill on your cardiac medications before your next appointment, please call your pharmacy*   Lab Work: NONE If you have labs (blood work) drawn today and your tests are completely normal, you will receive your results only by: MyChart Message (if you have MyChart) OR A paper copy in the mail If you have any lab test that is abnormal or we need to change your treatment, we will call you to review the results.   Testing/Procedures: NONE   Follow-Up: At [facility], you and your health needs are our priority.  As part of our continuing mission to provide you with exceptional heart care, we have created designated Provider Care Teams.  These Care Teams include your primary Cardiologist (physician) and Advanced Practice Providers (APPs -  Physician Assistants and Nurse Practitioners) who all work together to provide you with the care you need, when you need it.  We recommend signing up for the patient portal called "MyChart".  Sign up information is provided on this After Visit Summary.  MyChart is used to connect with patients for Virtual Visits (Telemedicine).  Patients are able to view lab/test results, encounter notes, upcoming appointments, etc.  Non-urgent messages can be sent to your provider as well.   To learn more about what you can do with MyChart, go to [email or web address].    Your next appointment:   As Needed  Provider:   Kristeen Miss     Signed, Kristeen Miss, MD  06/05/2022 2:07 PM     [facility]

## 2022-06-05 ENCOUNTER — Encounter: Payer: Self-pay | Admitting: Cardiovascular Disease

## 2022-06-05 ENCOUNTER — Ambulatory Visit: Payer: 59 | Attending: Cardiovascular Disease | Admitting: Cardiovascular Disease

## 2022-06-05 VITALS — BP 112/78 | HR 77 | Ht 66.0 in | Wt 228.0 lb

## 2022-06-05 DIAGNOSIS — I639 Cerebral infarction, unspecified: Secondary | ICD-10-CM | POA: Diagnosis not present

## 2022-06-05 DIAGNOSIS — Q2112 Patent foramen ovale: Secondary | ICD-10-CM | POA: Diagnosis not present

## 2022-06-05 NOTE — Patient Instructions (Signed)
Medication Instructions:  Your physician recommends that you continue on your current medications as directed. Please refer to the Current Medication list given to you today.  *If you need a refill on your cardiac medications before your next appointment, please call your pharmacy*   Lab Work: NONE If you have labs (blood work) drawn today and your tests are completely normal, you will receive your results only by: MyChart Message (if you have MyChart) OR A paper copy in the mail If you have any lab test that is abnormal or we need to change your treatment, we will call you to review the results.   Testing/Procedures: NONE   Follow-Up: At [facility], you and your health needs are our priority.  As part of our continuing mission to provide you with exceptional heart care, we have created designated Provider Care Teams.  These Care Teams include your primary Cardiologist (physician) and Advanced Practice Providers (APPs -  Physician Assistants and Nurse Practitioners) who all work together to provide you with the care you need, when you need it.  We recommend signing up for the patient portal called "MyChart".  Sign up information is provided on this After Visit Summary.  MyChart is used to connect with patients for Virtual Visits (Telemedicine).  Patients are able to view lab/test results, encounter notes, upcoming appointments, etc.  Non-urgent messages can be sent to your provider as well.   To learn more about what you can do with MyChart, go to [email or web address].    Your next appointment:   As Needed  Provider:   Kristeen Miss

## 2022-06-10 ENCOUNTER — Ambulatory Visit (INDEPENDENT_AMBULATORY_CARE_PROVIDER_SITE_OTHER): Payer: 59

## 2022-06-10 DIAGNOSIS — Z7901 Long term (current) use of anticoagulants: Secondary | ICD-10-CM

## 2022-06-10 LAB — POCT INR: INR: 2.8 (ref 2.0–3.0)

## 2022-06-10 NOTE — Progress Notes (Signed)
Continue 3 tablets daily except take 2 tablets on Mondays and Fridays. Recheck on 6/17 at [place], [address].

## 2022-06-10 NOTE — Patient Instructions (Addendum)
Pre visit review using our clinic review tool, if applicable. No additional management support is needed unless otherwise documented below in the visit note.  Continue 3 tablets daily except take 2 tablets on Mondays and Fridays. Recheck on 6/17 at [place], [address].

## 2022-06-18 NOTE — Progress Notes (Signed)
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  Follow up heme testing pending.   Atorvastatin 40mg  as managed by PCP.  LDL goal less than 70.  Current value is acceptable Normotensive blood pressure Hgb A1c goal less than 7.  Current value is acceptable Follow up in 6 months.       Subjective:  Kristen Hoover is a 37 year old right-handed female with PCOS, depression, and history of kidney stone who follows up for headaches and thalamic stroke.    UPDATE: Hypercoagulable panel on 2/9 revealed Beta 2 glycoprotein IgG 77 and anticardiolipin antibody of 40 but otherwise WNL.  Started on warfarin.  Hgb A1c 5.0.  LDL was 149.  Started atorvastatin 40mg  daily.  CTA head and neck on 2/14 personally reviewed was normal.  2D echo on 3/11 revealed EF 60-65% with no valvular disease and negative bubble study.  She saw cardiology  TEE on 3/15 revealed EF 55-60% but agitated saline contrast bubble study was positive suggestive of interatrial shunt.  As plan is for lifelong anticoagulation, PFO closure felt not to be indicated.      No longer has headache since starting ASA.  Occasionally she may have a brief paroxysmal headache from time to time.  Infrequent.    05/14/2022 LABS:  LDL 73, Hgb A1c 5  Current NSAIDS/analgesics:  ASA 81mg  daily, Tylenol Current muscle relaxants:  Zofran Current Antihypertensive medications:  none Current Antidepressant medications:  fluoxetine 40mg  daily Current Anticonvulsant medications:  none Current Antihistamines/Decongestants:  meclizine (not using it) Birth control:  none Other medications:  Buspar 15mg   BID   HISTORY: She began experiencing a persistent daily headache in early December.  She describes a sharp pressure pain behind the left eye, left eyebrow tender to touch. Tylenol helps take the edge off. She also had a couple of episodes of horizontal double vision lasting a few minutes.  She started seeing floaters in her vision.  More recently she sees "an amber shooting star" in her vision.  She saw ophthalmology and was prescribed prism glasses.  She was prescribed a prednisone taper at that time which helped.  However, she started having episodes of vertigo, spinning sensation lasting a couple of minutes associated with ringing in the left ear.   She had an MRI of the brain with and without contrast on 02/12/2022 which was personally reviewed and revealed small subacute infarcts within the right thalamus and right centrum semiovale as well as tiny remote infarct within the left cerebellar hemisphere.  In hindsight, she notes that her left pinky finger feels numb and that her left cheek feels slightly different to touch.  No slurred speech or unilateral weakness.  She was advised to start ASA 81mg  daily   She denies prior history of migraines or other headaches.  Her brother was killed in a MVC in May 2023.  Over the holidays, she started experiencing increased depression.  She has noted possible palpitations but unsure if it is anxiety.  She is taking medical leave to have adjustments made in treatment for her depression.  Denies family history of stroke at early age.  She does not have stroke risk factors  such as heart disease, diabetes, hypertension or cigarette smoking.  However, she has slight hyperlipidemia and smokes cannabis.  She just recently quit smoking cannabis and has already noted improvement in her headaches.    PAST MEDICAL HISTORY: Past Medical History:  Diagnosis Date   Anxiety and depression    Frequency of urination    GERD (gastroesophageal reflux disease)    History of blood  clots    History of pancreatitis    12-28-2018-  last episode 2006   PCOS (polycystic ovarian syndrome)    Renal calculus, right    Stroke [facility])    Urgency of urination    Wears glasses     MEDICATIONS: Current Outpatient Medications on File Prior to Visit  Medication Sig Dispense Refill   ALPRAZolam (XANAX) 0.5 MG tablet Take 1/2 to 1 tab po BID prn for acute panic. 30 tablet 2   aspirin 81 MG chewable tablet Chew 81 mg by mouth daily.     atorvastatin (LIPITOR) 40 MG tablet TAKE 1 TABLET BY MOUTH EVERY DAY 90 tablet 1   calcium carbonate (TUMS - DOSED IN MG ELEMENTAL CALCIUM) 500 MG chewable tablet Chew 2 tablets by mouth daily as needed for indigestion or heartburn.     enoxaparin (LOVENOX) 150 MG/ML injection Inject 1 mL (150 mg total) into the skin daily. TAKE AS DIRECTED BY ANTICOAGULATION CLINIC EVERY MORNING (Patient not taking: Reported on 06/05/2022) 7 mL 0   folic acid (FOLVITE) 1 MG tablet Take 1 mg by mouth daily.     metFORMIN (GLUCOPHAGE-XR) 750 MG 24 hr tablet Take 750 mg by mouth 2 (two) times daily.     OMEGA-3-ACID ETHYL ESTERS PO Take 650 mg by mouth daily.     ondansetron (ZOFRAN) 4 MG tablet Take 1 tablet (4 mg total) by mouth every 8 (eight) hours as needed for nausea or vomiting. 30 tablet 2   Prasterone, DHEA, (DHEA PO) Take 5 mg by mouth daily.     spironolactone (ALDACTONE) 50 MG tablet Take 1 tablet (50 mg total) by mouth daily. 90 tablet 3   Vortioxetine HBr (TRINTELLIX PO) Take by mouth.     warfarin (COUMADIN) 5 MG tablet TAKE 1 1/2  TABLETS BY MOUTH DAILY EXCEPT TAKE 2 TABLETS ON MONDAYS AND THURSDAYS OR AS DIRECTED BY ANTICOAGULATION CLINIC 90 tablet 0   No current facility-administered medications on file prior to visit.    ALLERGIES: No Known Allergies  FAMILY HISTORY: Family History  Problem Relation Age of Onset   Fibromyalgia Mother    Headache Mother    Neuropathy Father    Diabetes Father    Heart failure Father    Diabetes Brother     Colon cancer Paternal Grandfather    Liver disease Neg Hx    Esophageal cancer Neg Hx       Objective:  Blood pressure 112/62, pulse 83, height 5\' 6"  (1.676 m), weight 230 lb (104.3 kg), SpO2 98 %. General: No acute distress.  Patient appears well-groomed.   Head:  Normocephalic/atraumatic Eyes:  Fundi examined but not visualized Neck: supple, no paraspinal tenderness, full range of motion Heart:  Regular rate and rhythm Lungs:  Clear to auscultation bilaterally Back: No paraspinal tenderness Neurological Exam: alert and oriented.  Speech fluent and not dysarthric, language intact.  CN II-XII intact. Bulk and tone normal, muscle strength 5/5 throughout.  Sensation to pinprick and vibration intact.  Deep tendon reflexes 2+ throughout, toes downgoing.  Finger to nose testing intact.  Gait normal, able to tandem walk.  Romberg negative.   Shon Millet, DO  CC: Alyssa Allwardt, PA-C

## 2022-06-20 ENCOUNTER — Ambulatory Visit: Payer: 59 | Admitting: Neurology

## 2022-06-20 ENCOUNTER — Encounter: Payer: Self-pay | Admitting: Neurology

## 2022-06-20 VITALS — BP 112/62 | HR 83 | Ht 66.0 in | Wt 230.0 lb

## 2022-06-20 DIAGNOSIS — I639 Cerebral infarction, unspecified: Secondary | ICD-10-CM

## 2022-06-20 DIAGNOSIS — D6859 Other primary thrombophilia: Secondary | ICD-10-CM | POA: Diagnosis not present

## 2022-06-20 DIAGNOSIS — Q2112 Patent foramen ovale: Secondary | ICD-10-CM | POA: Diagnosis not present

## 2022-06-24 ENCOUNTER — Ambulatory Visit (INDEPENDENT_AMBULATORY_CARE_PROVIDER_SITE_OTHER): Payer: 59

## 2022-06-24 DIAGNOSIS — Z7901 Long term (current) use of anticoagulants: Secondary | ICD-10-CM

## 2022-06-24 LAB — POCT INR: INR: 2.9 (ref 2.0–3.0)

## 2022-06-24 MED ORDER — WARFARIN SODIUM 5 MG PO TABS
ORAL_TABLET | ORAL | 1 refills | Status: DC
Start: 2022-06-24 — End: 2022-12-30

## 2022-06-24 NOTE — Patient Instructions (Addendum)
Pre visit review using our clinic review tool, if applicable. No additional management support is needed unless otherwise documented below in the visit note.  Continue 3 tablets daily except take 2 tablets on Mondays and Fridays. Recheck in 3 weeks, on 07/15/22 at [place], [address].

## 2022-06-24 NOTE — Progress Notes (Addendum)
Continue 3 tablets daily except take 2 tablets on Mondays and Fridays. Recheck in 3 weeks, on 07/15/22 at [place], [address].   Pt is compliant with warfarin management and PCP apts.  Sent in refill of warfarin to requested pharmacy.  Pt requested to keep 5 mg tablets.

## 2022-07-10 ENCOUNTER — Other Ambulatory Visit: Payer: Self-pay | Admitting: Hematology and Oncology

## 2022-07-10 ENCOUNTER — Inpatient Hospital Stay: Payer: 59 | Admitting: Hematology and Oncology

## 2022-07-10 ENCOUNTER — Inpatient Hospital Stay: Payer: 59 | Attending: Hematology and Oncology

## 2022-07-10 ENCOUNTER — Other Ambulatory Visit: Payer: Self-pay

## 2022-07-10 VITALS — BP 115/76 | HR 74 | Temp 97.7°F | Resp 15 | Wt 231.0 lb

## 2022-07-10 DIAGNOSIS — Z7901 Long term (current) use of anticoagulants: Secondary | ICD-10-CM | POA: Insufficient documentation

## 2022-07-10 DIAGNOSIS — Z8673 Personal history of transient ischemic attack (TIA), and cerebral infarction without residual deficits: Secondary | ICD-10-CM | POA: Insufficient documentation

## 2022-07-10 DIAGNOSIS — Z79899 Other long term (current) drug therapy: Secondary | ICD-10-CM | POA: Insufficient documentation

## 2022-07-10 DIAGNOSIS — R760 Raised antibody titer: Secondary | ICD-10-CM | POA: Insufficient documentation

## 2022-07-10 DIAGNOSIS — Z800 Family history of malignant neoplasm of digestive organs: Secondary | ICD-10-CM | POA: Diagnosis not present

## 2022-07-10 DIAGNOSIS — E282 Polycystic ovarian syndrome: Secondary | ICD-10-CM | POA: Insufficient documentation

## 2022-07-10 LAB — CBC WITH DIFFERENTIAL (CANCER CENTER ONLY)
Abs Immature Granulocytes: 0.05 10*3/uL (ref 0.00–0.07)
Basophils Absolute: 0.1 10*3/uL (ref 0.0–0.1)
Basophils Relative: 1 %
Eosinophils Absolute: 0.4 10*3/uL (ref 0.0–0.5)
Eosinophils Relative: 3 %
HCT: 36.5 % (ref 36.0–46.0)
Hemoglobin: 13 g/dL (ref 12.0–15.0)
Immature Granulocytes: 1 %
Lymphocytes Relative: 30 %
Lymphs Abs: 3.1 10*3/uL (ref 0.7–4.0)
MCH: 28.5 pg (ref 26.0–34.0)
MCHC: 35.6 g/dL (ref 30.0–36.0)
MCV: 80 fL (ref 80.0–100.0)
Monocytes Absolute: 0.7 10*3/uL (ref 0.1–1.0)
Monocytes Relative: 7 %
Neutro Abs: 6.3 10*3/uL (ref 1.7–7.7)
Neutrophils Relative %: 58 %
Platelet Count: 203 10*3/uL (ref 150–400)
RBC: 4.56 MIL/uL (ref 3.87–5.11)
RDW: 13.6 % (ref 11.5–15.5)
WBC Count: 10.6 10*3/uL — ABNORMAL HIGH (ref 4.0–10.5)
nRBC: 0 % (ref 0.0–0.2)

## 2022-07-10 LAB — CMP (CANCER CENTER ONLY)
ALT: 20 U/L (ref 0–44)
AST: 14 U/L — ABNORMAL LOW (ref 15–41)
Albumin: 3.9 g/dL (ref 3.5–5.0)
Alkaline Phosphatase: 83 U/L (ref 38–126)
Anion gap: 7 (ref 5–15)
BUN: 15 mg/dL (ref 6–20)
CO2: 23 mmol/L (ref 22–32)
Calcium: 9.2 mg/dL (ref 8.9–10.3)
Chloride: 108 mmol/L (ref 98–111)
Creatinine: 0.76 mg/dL (ref 0.44–1.00)
GFR, Estimated: >60 mL/min (ref >60)
Glucose, Bld: 94 mg/dL (ref 70–99)
Potassium: 4.2 mmol/L (ref 3.5–5.1)
Sodium: 138 mmol/L (ref 135–145)
Total Bilirubin: 0.3 mg/dL (ref 0.3–1.2)
Total Protein: 6.7 g/dL (ref 6.5–8.1)

## 2022-07-10 NOTE — Progress Notes (Signed)
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 All other were WNL.  04/10/2022: establish care with Dr. Leonides Schanz   Interval History:  Kristen Hoover 37 y.o. 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 The patient's last visit was on 04/10/2022 at which time she established care. In the interim since the last visit she is continued faithfully on her Coumadin therapy and has been steadily within her INR goal range.  On exam today Kristen Hoover reports it did take a while to get her INR in target.  She reports she was on Lovenox for about 1 month but is now consistently between the 2.0 and 3.0 range for her Coumadin levels.  She notes that she is not having any issues with bleeding such as nosebleeds, gum bleeding, or dark stools.  She does however have some easy bruising.  She notes her menstrual cycles are heavier than usual and she goes through a heavy pad/tampon every 3-4 hours.  She notes that she has had to make changes to her diet because of her Coumadin therapy but would like to meet with a nutritionist due to her PCOS and insulin resistance.  She notes that she feels good on the Coumadin overall.  She is no longer having any issues of double vision, vertigo, or headaches.  Overall she is willing and able to continue on Coumadin therapy at this time.  She denies any fevers, chills, sweats, nausea, vomiting or diarrhea.  A full 10 point ROS is  otherwise negative.  MEDICAL HISTORY:  Past Medical History:  Diagnosis Date   Anxiety and depression    Frequency of urination    GERD (gastroesophageal reflux disease)    History of blood clots    History of pancreatitis    12-28-2018-  last episode 2006   PCOS (polycystic ovarian syndrome)    Renal calculus, right    Stroke [facility])    Urgency of urination    Wears glasses     SURGICAL HISTORY: Past Surgical History:  Procedure Laterality Date   BUBBLE STUDY  03/22/2022   Procedure: BUBBLE STUDY;  Surgeon: Lewayne Bunting, MD;  Location: [facility] ENDOSCOPY;  Service: Cardiovascular;;   CYSTOSCOPY WITH RETROGRADE PYELOGRAM, URETEROSCOPY AND STENT PLACEMENT Right 01/05/2019   Procedure: CYSTOSCOPY/URETEROSCOP/STENT PLACEMENT;  Surgeon: Noel Christmas, MD;  Location: [facility];  Service: Urology;  Laterality: Right;  75 MINS   CYSTOSCOPY WITH RETROGRADE PYELOGRAM, URETEROSCOPY AND STENT PLACEMENT Right 02/09/2019   Procedure: CYSTOSCOPY WITH RETROGRADE PYELOGRAM, URETEROSCOPY AND STENT PLACEMENT;  Surgeon: Noel Christmas, MD;  Location: [facility];  Service: Urology;  Laterality: Right;  1 HR   ESOPHAGOGASTRODUODENOSCOPY  2015   HOLMIUM LASER APPLICATION Right 02/09/2019   Procedure: HOLMIUM LASER APPLICATION;  Surgeon: Noel Christmas, MD;  Location: [facility];  Service: Urology;  Laterality: Right;   TEE WITHOUT CARDIOVERSION N/A 03/22/2022   Procedure: TRANSESOPHAGEAL ECHOCARDIOGRAM (TEE);  Surgeon: Lewayne Bunting, MD;  Location: [facility] ENDOSCOPY;  Service: Cardiovascular;  Laterality:  N/A;   TONSILLECTOMY  2014   WISDOM TOOTH EXTRACTION  2018    SOCIAL HISTORY: Social History   Socioeconomic History   Marital status: Married    Spouse name: Not on file   Number of children: 0   Years of education: Not on file   Highest education level: Bachelor's degree (e.g., BA, AB, BS)  Occupational History   Occupation: Engineer, building services  Tobacco Use   Smoking status: Never   Smokeless tobacco: Never  Vaping Use   Vaping Use: Never used  Substance and Sexual Activity   Alcohol use: Not Currently   Drug use: Not Currently    Types: Marijuana    Comment: 12-28-2018  per pt last used 5 wks ago   Sexual activity: Not on file    Comment: husband had vasectomy  Other Topics Concern   Not on file  Social History Narrative   Are you right handed or left handed? Right    Are you currently employed ?    What is your current occupation?   Do you live at home alone?   Who lives with you?    What type of home do you live in: 1 story or 2 story? 2       Social Determinants of Health   Financial Resource Strain: Low Risk  (05/13/2022)   Overall Financial Resource Strain (CARDIA)    Difficulty of Paying Living Expenses: Not hard at all  Food Insecurity: No Food Insecurity (05/13/2022)   Hunger Vital Sign    Worried About Running Out of Food in the Last Year: Never true    Ran Out of Food in the Last Year: Never true  Transportation Needs: No Transportation Needs (05/13/2022)   PRAPARE - Administrator, Civil Service (Medical): No    Lack of Transportation (Non-Medical): No  Physical Activity: Sufficiently Active (05/13/2022)   Exercise Vital Sign    Days of Exercise per Week: 5 days    Minutes of Exercise per Session: 30 min  Stress: Stress Concern Present (05/13/2022)   [employer] of Occupational Health - Occupational Stress Questionnaire    Feeling of Stress : To some extent  Social Connections: Moderately Isolated (05/13/2022)   Social Connection and Isolation Panel [NHANES]    Frequency of Communication with Friends and Family: Three times a week    Frequency of Social Gatherings with Friends and Family: Once a week    Attends Religious Services: Never    Database administrator or Organizations: No    Attends Engineer, structural: Not on file    Marital Status: Married  Catering manager  Violence: Not At Risk (04/10/2022)   Humiliation, Afraid, Rape, and Kick questionnaire    Fear of Current or Ex-Partner: No    Emotionally Abused: No    Physically Abused: No    Sexually Abused: No    FAMILY HISTORY: Family History  Problem Relation Age of Onset   Fibromyalgia Mother    Headache Mother    Neuropathy Father    Diabetes Father    Heart failure Father    Diabetes Brother    Colon cancer Paternal Grandfather    Liver disease Neg Hx    Esophageal cancer Neg Hx     ALLERGIES:  has No Known Allergies.  MEDICATIONS:  Current Outpatient Medications  Medication Sig Dispense Refill   ALPRAZolam (XANAX) 0.5 MG tablet Take 1/2 to 1 tab po BID prn for acute panic. 30  tablet 2   aspirin 81 MG chewable tablet Chew 81 mg by mouth daily.     atorvastatin (LIPITOR) 40 MG tablet TAKE 1 TABLET BY MOUTH EVERY DAY 90 tablet 1   calcium carbonate (TUMS - DOSED IN MG ELEMENTAL CALCIUM) 500 MG chewable tablet Chew 2 tablets by mouth daily as needed for indigestion or heartburn.     folic acid (FOLVITE) 1 MG tablet Take 1 mg by mouth daily.     metFORMIN (GLUCOPHAGE-XR) 750 MG 24 hr tablet Take 750 mg by mouth 2 (two) times daily.     OMEGA-3-ACID ETHYL ESTERS PO Take 650 mg by mouth daily.     ondansetron (ZOFRAN) 4 MG tablet Take 1 tablet (4 mg total) by mouth every 8 (eight) hours as needed for nausea or vomiting. 30 tablet 2   Prasterone, DHEA, (DHEA PO) Take 5 mg by mouth daily.     spironolactone (ALDACTONE) 50 MG tablet Take 1 tablet (50 mg total) by mouth daily. 90 tablet 3   Vortioxetine HBr (TRINTELLIX PO) Take by mouth.     warfarin (COUMADIN) 5 MG tablet TAKE 3  TABLETS BY MOUTH DAILY EXCEPT TAKE 2 TABLETS ON MONDAYS AND FRIDAYS OR AS DIRECTED BY ANTICOAGULATION CLINIC 240 tablet 1   No current facility-administered medications for this visit.    REVIEW OF SYSTEMS:   Constitutional: ( - ) fevers, ( - )  chills , ( - ) night sweats Eyes: ( - ) blurriness of vision, ( - )  double vision, ( - ) watery eyes Ears, nose, mouth, throat, and face: ( - ) mucositis, ( - ) sore throat Respiratory: ( - ) cough, ( - ) dyspnea, ( - ) wheezes Cardiovascular: ( - ) palpitation, ( - ) chest discomfort, ( - ) lower extremity swelling Gastrointestinal:  ( - ) nausea, ( - ) heartburn, ( - ) change in bowel habits Skin: ( - ) abnormal skin rashes Lymphatics: ( - ) new lymphadenopathy, ( - ) easy bruising Neurological: ( - ) numbness, ( - ) tingling, ( - ) new weaknesses Behavioral/Psych: ( - ) mood change, ( - ) new changes  All other systems were reviewed with the patient and are negative.  PHYSICAL EXAMINATION:  Vitals:   07/10/22 0851  BP: 115/76  Pulse: 74  Resp: 15  Temp: 97.7 F (36.5 C)  SpO2: 99%   Filed Weights   07/10/22 0851  Weight: 231 lb (104.8 kg)    GENERAL: Well-appearing middle-aged Caucasian female, alert, no distress and comfortable SKIN: skin color, texture, turgor are normal, no rashes or significant lesions EYES: conjunctiva are pink and non-injected, sclera clear LUNGS: clear to auscultation and percussion with normal breathing effort HEART: regular rate & rhythm and no murmurs and no lower extremity edema Musculoskeletal: no cyanosis of digits and no clubbing  PSYCH: alert & oriented x 3, fluent speech NEURO: no focal motor/sensory deficits  LABORATORY DATA:  I have reviewed the data as listed    Latest Ref Rng & Units 07/10/2022    8:19 AM 05/10/2022    9:00 AM 03/18/2022    2:38 PM  CBC  WBC 4.0 - 10.5 K/uL 10.6  10.2  11.2   Hemoglobin 12.0 - 15.0 g/dL 08.6  57.8  46.9   Hematocrit 36.0 - 46.0 % 36.5  37.0  35.8   Platelets 150 - 400 K/uL 203  233  267        Latest Ref Rng & Units  05/14/2022    8:56 AM 05/10/2022    9:00 AM 03/18/2022    2:38 PM  CMP  Glucose 70 - 99 mg/dL 95  92  75   BUN 6 - 23 mg/dL 14  17  10    Creatinine 0.40 - 1.20 mg/dL 1.61  0.96  0.45   Sodium 135 - 145 mEq/L 138  136  142   Potassium 3.5 - 5.1 mEq/L  4.5  4.2  4.1   Chloride 96 - 112 mEq/L 105  107  104   CO2 19 - 32 mEq/L 27  23  23    Calcium 8.4 - 10.5 mg/dL 9.2  9.2  9.5   Total Protein 6.0 - 8.3 g/dL 7.2  7.6    Total Bilirubin 0.2 - 1.2 mg/dL 0.3  0.3    Alkaline Phos 39 - 117 U/L 86  89    AST 0 - 37 U/L 17  16    ALT 0 - 35 U/L 27  30      RADIOGRAPHIC STUDIES: No results found.  ASSESSMENT & PLAN Kristen Hoover 37 y.o. 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  After review of the labs, review of the records, and discussion with the patient the patients findings are most consistent with elevated APS antibodies in the setting of a prior CVA.   Antiphospholipid antibody syndrome is a hypercoagulation and autoimmune condition that results in increased frequency of venous and arterial thrombosis. In order to meet the diagnostic criteria for APS a patient must have one of both the clinical and laboratory criteria noted from the [place] Criteria (noted below). Laboratory criteria must be met on at least 2 occasions, separated by at least 12 weeks. Presence of antibodies in the absence of clinical criteria fails to meet diagnostic criteria and does not merit treatment or further investigation.      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  Of the two the beta-2 glycoprotein is the more clinically significant. -- Lupus anticoagulant panel was also drawn, however this appears to been drawn while on anticoagulation. Results are difficult to interpret.  -- recommend patient continue Coumadin therapy given the fact she had an arterial thrombus with an elevated antiphospholipid antibody. -- Patient's initial testing was on 02/15/2022.  Confirmatory testing recommended in May 2024, no performed. These tests were collected  today. --Labs today show white blood cell count 10.6, hemoglobin 13.0, MCV 80, and platelets of 203 --RTC in 6 months for repeat evaluation    No orders of the defined types were placed in this encounter.   All questions were answered. The patient knows to call the clinic with any problems, questions or concerns.  A total of more than 30 minutes were spent on this encounter with face-to-face time and non-face-to-face time, including preparing to see the patient, ordering tests and/or medications, counseling the patient and coordination of care as outlined above.   Ulysees Barns, MD Department of Hematology/Oncology [facility] Phone: [phone] Pager: [phone] Email: [email or web address]  07/10/2022 8:56 AM

## 2022-07-12 LAB — BETA-2-GLYCOPROTEIN I ABS, IGG/M/A
Beta-2 Glyco I IgG: 82 GPI IgG units — ABNORMAL HIGH (ref 0–20)
Beta-2-Glycoprotein I IgA: <9 GPI IgA units (ref 0–25)
Beta-2-Glycoprotein I IgM: <9 GPI IgM units (ref 0–32)

## 2022-07-12 LAB — CARDIOLIPIN ANTIBODIES, IGG, IGM, IGA
Anticardiolipin IgA: <9 APL U/mL (ref 0–11)
Anticardiolipin IgG: 27 GPL U/mL — ABNORMAL HIGH (ref 0–14)
Anticardiolipin IgM: <9 MPL U/mL (ref 0–12)

## 2022-07-15 ENCOUNTER — Ambulatory Visit (INDEPENDENT_AMBULATORY_CARE_PROVIDER_SITE_OTHER): Payer: 59

## 2022-07-15 ENCOUNTER — Telehealth: Payer: Self-pay | Admitting: *Deleted

## 2022-07-15 DIAGNOSIS — Z7901 Long term (current) use of anticoagulants: Secondary | ICD-10-CM | POA: Diagnosis not present

## 2022-07-15 LAB — POCT INR: INR: 2.2 (ref 2.0–3.0)

## 2022-07-15 NOTE — Patient Instructions (Addendum)
Pre visit review using our clinic review tool, if applicable. No additional management support is needed unless otherwise documented below in the visit note.  Continue 3 tablets daily except take 2 tablets on Mondays and Fridays. Recheck in 3 weeks, on 08/05/22 at [place], [address].

## 2022-07-15 NOTE — Telephone Encounter (Signed)
Received call back from pt. 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 We will continue on coumadin (the best therapy for this situation). We will plan to see her back in Jan 2025.   Pt states that she expected that answer and is aware of her next appt. No questions or concerns.

## 2022-07-15 NOTE — Telephone Encounter (Signed)
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 We will continue on coumadin (the best therapy for this situation). We will plan to see her back in Jan 2025.   ----- Message ----- From: Leory Plowman, Lab In [place] Sent: 07/10/2022   8:33 AM EDT To: Jaci Standard, MD

## 2022-07-15 NOTE — Progress Notes (Signed)
Pt has been taking tumeric but ran out about one week ago. She has been on this since starting warfarin and last two INR checks were in range with pt on tumeric. Pt will restart tumeric in the near future.  Continue 3 tablets daily except take 2 tablets on Mondays and Fridays. Recheck in 3 weeks, on 08/05/22 at [place], [address].  Pt requested return in 3 weeks. Could have pushed next INR check to 4 weeks.

## 2022-07-15 NOTE — Telephone Encounter (Signed)
TCT patient regarding recent lab results. No answer but was able to leave vm message for her to return this call to [phone] @ her convenience to review her lab results.

## 2022-07-19 ENCOUNTER — Encounter: Payer: Self-pay | Admitting: Physician Assistant

## 2022-08-01 ENCOUNTER — Encounter: Payer: 59 | Admitting: Physician Assistant

## 2022-08-02 ENCOUNTER — Emergency Department: Admit: 2022-08-02 | Payer: PRIVATE HEALTH INSURANCE

## 2022-08-02 ENCOUNTER — Inpatient Hospital Stay
Admit: 2022-08-02 | Discharge: 2022-08-02 | Payer: PRIVATE HEALTH INSURANCE | Attending: Neurology | Admitting: Internal Medicine

## 2022-08-02 ENCOUNTER — Inpatient Hospital Stay: Admit: 2022-08-02 | Payer: PRIVATE HEALTH INSURANCE

## 2022-08-02 DIAGNOSIS — R200 Anesthesia of skin: Secondary | ICD-10-CM | POA: Insufficient documentation

## 2022-08-02 DIAGNOSIS — Z79899 Other long term (current) drug therapy: Secondary | ICD-10-CM

## 2022-08-02 DIAGNOSIS — F17200 Nicotine dependence, unspecified, uncomplicated: Secondary | ICD-10-CM

## 2022-08-02 DIAGNOSIS — Q2112 Patent foramen ovale: Secondary | ICD-10-CM

## 2022-08-02 DIAGNOSIS — Z7984 Long term (current) use of oral hypoglycemic drugs: Secondary | ICD-10-CM

## 2022-08-02 DIAGNOSIS — D6861 Antiphospholipid syndrome: Secondary | ICD-10-CM

## 2022-08-02 DIAGNOSIS — Z8673 Personal history of transient ischemic attack (TIA), and cerebral infarction without residual deficits: Secondary | ICD-10-CM

## 2022-08-02 DIAGNOSIS — G5622 Lesion of ulnar nerve, left upper limb: Secondary | ICD-10-CM

## 2022-08-02 DIAGNOSIS — Z7982 Long term (current) use of aspirin: Secondary | ICD-10-CM

## 2022-08-02 DIAGNOSIS — F32A Depression, unspecified: Secondary | ICD-10-CM

## 2022-08-02 LAB — CBC WITH AUTO DIFFERENTIAL
BKR WAM ABSOLUTE IMMATURE GRANULOCYTES.: 0.05 x 1000/ÂµL (ref 0.00–0.30)
BKR WAM ABSOLUTE LYMPHOCYTE COUNT.: 3.74 x 1000/ÂµL — ABNORMAL HIGH (ref 0.60–3.70)
BKR WAM ABSOLUTE NRBC (2 DEC): 0 x 1000/ÂµL (ref 0.00–1.00)
BKR WAM ANC (ABSOLUTE NEUTROPHIL COUNT): 6.7 x 1000/ÂµL (ref 2.00–7.60)
BKR WAM BASOPHIL ABSOLUTE COUNT.: 0.1 x 1000/ÂµL (ref 0.00–1.00)
BKR WAM BASOPHILS: 0.9 % (ref 0.0–1.4)
BKR WAM EOSINOPHIL ABSOLUTE COUNT.: 0.31 x 1000/ÂµL (ref 0.00–1.00)
BKR WAM EOSINOPHILS: 2.7 % (ref 0.0–5.0)
BKR WAM HEMATOCRIT (2 DEC): 32.6 % — ABNORMAL LOW (ref 35.00–45.00)
BKR WAM HEMOGLOBIN: 11.4 g/dL — ABNORMAL LOW (ref 11.7–15.5)
BKR WAM IMMATURE GRANULOCYTES: 0.4 % (ref 0.0–1.0)
BKR WAM LYMPHOCYTES: 32 % (ref 17.0–50.0)
BKR WAM MCH (PG): 27.9 pg (ref 27.0–33.0)
BKR WAM MCHC: 35 g/dL (ref 31.0–36.0)
BKR WAM MCV: 79.7 fL — ABNORMAL LOW (ref 80.0–100.0)
BKR WAM MONOCYTE ABSOLUTE COUNT.: 0.78 x 1000/ÂµL (ref 0.00–1.00)
BKR WAM MONOCYTES: 6.7 % (ref 4.0–12.0)
BKR WAM MPV: 10.3 fL (ref 8.0–12.0)
BKR WAM NEUTROPHILS: 57.3 % (ref 39.0–72.0)
BKR WAM NUCLEATED RED BLOOD CELLS: 0 % (ref 0.0–1.0)
BKR WAM PLATELETS: 226 x1000/ÂµL (ref 150–420)
BKR WAM RDW-CV: 14 % (ref 11.0–15.0)
BKR WAM RED BLOOD CELL COUNT.: 4.09 M/ÂµL (ref 4.00–6.00)
BKR WAM WHITE BLOOD CELL COUNT: 11.7 x1000/ÂµL — ABNORMAL HIGH (ref 4.0–11.0)

## 2022-08-02 LAB — COMPREHENSIVE METABOLIC PANEL
BKR A/G RATIO: 1.2 (ref 1.0–2.2)
BKR ALANINE AMINOTRANSFERASE (ALT): 22 U/L (ref 10–35)
BKR ALBUMIN: 3.6 g/dL (ref 3.6–5.1)
BKR ALKALINE PHOSPHATASE: 101 U/L (ref 9–122)
BKR ANION GAP: 10 (ref 7–17)
BKR ASPARTATE AMINOTRANSFERASE (AST): 27 U/L (ref 10–35)
BKR AST/ALT RATIO: 1.2
BKR BILIRUBIN TOTAL: 0.3 mg/dL (ref <=1.2)
BKR BLOOD UREA NITROGEN: 14 mg/dL (ref 6–20)
BKR BUN / CREAT RATIO: 19.2 (ref 8.0–23.0)
BKR CALCIUM: 9 mg/dL (ref 8.8–10.2)
BKR CHLORIDE: 103 mmol/L (ref 98–107)
BKR CO2: 23 mmol/L (ref 20–30)
BKR CREATININE: 0.73 mg/dL (ref 0.40–1.30)
BKR EGFR, CREATININE (CKD-EPI 2021): >60 mL/min/{1.73_m2} (ref >=60)
BKR GLOBULIN: 3.1 g/dL (ref 2.0–3.9)
BKR GLUCOSE: 90 mg/dL (ref 70–100)
BKR POTASSIUM: 4 mmol/L (ref 3.3–5.3)
BKR PROTEIN TOTAL: 6.7 g/dL (ref 5.9–8.3)
BKR SODIUM: 136 mmol/L — ABNORMAL HIGH (ref 136–144)

## 2022-08-02 LAB — URINE DRUG SCREEN W/ NO CONF   (BH GH LMH YH)
BKR AMPHETAMINE SCREEN, URINE, NO CONF.: NEGATIVE mL/min/{1.73_m2} (ref >=60)
BKR BARBITURATE SCREEN, URINE, NO CONF.: NEGATIVE
BKR BENZODIAZEPINES SCREEN, URINE, NO CONF.: NEGATIVE mmol/L (ref 20–30)
BKR CANNABINOIDS SCREEN, URINE, NO CONF.: POSITIVE — AB (ref 7–17)
BKR COCAINE SCREEN, URINE, NO CONF.: NEGATIVE
BKR METHADONE METABOLITE SCREEN, URINE, NO CONF.: NEGATIVE mg/dL (ref 6–20)
BKR OPIATES SCREEN, URINE, NO CONF.: NEGATIVE mg/dL (ref 0.40–1.30)
BKR OXYCODONE SCREEN, URINE, NO CONF.: NEGATIVE mg/dL (ref 8.8–10.2)
BKR PHENCYCLIDINE (PCP) SCREEN, URINE, NO CONF.: NEGATIVE (ref 8.0–23.0)

## 2022-08-02 LAB — BASIC METABOLIC PANEL
BKR ANION GAP: 12 (ref 7–17)
BKR BLOOD UREA NITROGEN: 16 mg/dL (ref 6–20)
BKR BUN / CREAT RATIO: 20 (ref 8.0–23.0)
BKR CALCIUM: 9.5 mg/dL (ref 8.8–10.2)
BKR CHLORIDE: 103 mmol/L (ref 98–107)
BKR CO2: 22 mmol/L (ref 20–30)
BKR CREATININE: 0.8 mg/dL (ref 0.40–1.30)
BKR EGFR, CREATININE (CKD-EPI 2021): >60 mL/min/{1.73_m2} (ref >=60)
BKR GLUCOSE: 99 mg/dL (ref 70–100)
BKR POTASSIUM: 4.2 mmol/L (ref 3.3–5.3)
BKR SODIUM: 137 mmol/L (ref 136–144)

## 2022-08-02 LAB — CBC WITHOUT DIFFERENTIAL
BKR WAM ANC (ABSOLUTE NEUTROPHIL COUNT): 7.63 x 1000/ÂµL — ABNORMAL HIGH (ref 2.00–7.60)
BKR WAM HEMATOCRIT (2 DEC): 35.1 % (ref 35.00–45.00)
BKR WAM HEMOGLOBIN: 12.1 g/dL (ref 11.7–15.5)
BKR WAM MCH (PG): 27.8 pg (ref 27.0–33.0)
BKR WAM MCHC: 34.5 g/dL (ref 31.0–36.0)
BKR WAM MCV: 80.7 fL (ref 80.0–100.0)
BKR WAM MPV: 10 fL (ref 8.0–12.0)
BKR WAM PLATELETS: 226 x1000/ÂµL (ref 150–420)
BKR WAM RDW-CV: 14.3 % (ref 11.0–15.0)
BKR WAM RED BLOOD CELL COUNT.: 4.35 M/ÂµL (ref 4.00–6.00)
BKR WAM WHITE BLOOD CELL COUNT: 13 x1000/ÂµL — ABNORMAL HIGH (ref 4.0–11.0)

## 2022-08-02 LAB — HEMOGLOBIN A1C
BKR ESTIMATED AVERAGE GLUCOSE: 120 mg/dL
BKR HEMOGLOBIN A1C: 5.8 % — ABNORMAL HIGH (ref 4.0–5.6)

## 2022-08-02 LAB — FENTANYL, URINE, WITH NO CONFIRMATION (BH GH L LMW YH): BKR FENTANYL: NEGATIVE

## 2022-08-02 LAB — PROTIME AND INR
BKR INR: 2.83 seconds — ABNORMAL HIGH (ref 0.86–1.12)
BKR PROTHROMBIN TIME: 29.3 seconds — ABNORMAL HIGH (ref 9.6–12.3)

## 2022-08-02 LAB — TROPONIN T HIGH SENSITIVITY, 1 HOUR WITH REFLEX (BH GH LMW YH)
BKR TROPONIN T HS 1 HOUR DELTA FROM 0 HOUR: 0 ng/L
BKR TROPONIN T HS 1 HOUR: <6 ng/L

## 2022-08-02 LAB — LIPID PANEL
BKR CHOLESTEROL/HDL RATIO: 2.9 (ref 0.0–5.0)
BKR CHOLESTEROL: 113 mg/dL
BKR HDL CHOLESTEROL: 39 mg/dL — ABNORMAL LOW (ref >=40)
BKR LDL CHOLESTEROL SAMPSON CALCULATED: 56 mg/dL — ABNORMAL HIGH (ref 9.6–12.3)
BKR TRIGLYCERIDES: 96 mg/dL — ABNORMAL HIGH

## 2022-08-02 LAB — BUPRENORPHINE, URINE, WITH NO CONFIRMATION   (BH GH LMW YH): BKR BUPRENORPHINE SCREEN: NEGATIVE

## 2022-08-02 LAB — T4, FREE: BKR FREE T4: 0.93 ng/dL

## 2022-08-02 LAB — TSH W/REFLEX TO FT4     (BH GH LMW Q YH): BKR THYROID STIMULATING HORMONE: 4.24 u[IU]/mL — ABNORMAL HIGH

## 2022-08-02 LAB — LDL CHOLESTEROL, DIRECT: BKR LDL CHOLESTEROL DIRECT: 63 mg/dL — ABNORMAL LOW (ref >=40)

## 2022-08-02 LAB — PT/INR AND PTT (BH GH L LMW YH)
BKR INR: 2.83 — ABNORMAL HIGH (ref 0.86–1.12)
BKR PARTIAL THROMBOPLASTIN TIME: 40.8 s — ABNORMAL HIGH (ref 23.0–31.4)
BKR PROTHROMBIN TIME: 29.3 s — ABNORMAL HIGH (ref 9.6–12.3)

## 2022-08-02 LAB — TROPONIN T HIGH SENSITIVITY, 0 HOUR BASELINE WITH REFLEX (BH GH LMW YH): BKR TROPONIN T HS 0 HOUR BASELINE: 6 ng/L

## 2022-08-02 LAB — PARTIAL THROMBOPLASTIN TIME     (BH GH LMW Q YH): BKR PARTIAL THROMBOPLASTIN TIME: 40.7 s — ABNORMAL HIGH (ref 23.0–31.4)

## 2022-08-02 MED ORDER — IOHEXOL 350 MG IODINE/ML INTRAVENOUS SOLUTION
350 mg iodine/mL | INTRAVENOUS | Status: CP
  Administered 2022-08-02: 06:00:00 350 mL via INTRAVENOUS

## 2022-08-02 MED ORDER — SODIUM CHLORIDE 0.9 % (FLUSH) INJECTION SYRINGE
0.9 | INTRAVENOUS | Status: DC | PRN
Start: 2022-08-02 — End: 2022-08-02

## 2022-08-02 MED ORDER — SODIUM CHLORIDE 0.9 % LARGE VOLUME SYRINGE FOR AUTOINJECTOR
0.9% | INTRAVENOUS | Status: CP
  Administered 2022-08-02: 06:00:00 via INTRAVENOUS

## 2022-08-02 MED ORDER — ROSUVASTATIN 20 MG TABLET
20 | Freq: Every day | ORAL | Status: DC
Start: 2022-08-02 — End: 2022-08-02

## 2022-08-02 MED ORDER — WARFARIN THERAPY PLACEHOLDER
ORAL | Status: DC
Start: 2022-08-02 — End: 2022-08-02

## 2022-08-02 MED ORDER — SPIRONOLACTONE 25 MG TABLET
25 mg | ORAL | Status: DC
  Administered 2022-08-02: 13:00:00 25 mg via ORAL

## 2022-08-02 MED ORDER — VORTIOXETINE 5 MG TABLET
5 mg | ORAL | Status: DC
  Administered 2022-08-02: 13:00:00 5 mg via ORAL

## 2022-08-02 MED ORDER — FOLIC ACID 1 MG TABLET
1 | Freq: Every day | ORAL | Status: AC
Start: 2022-08-02 — End: ?

## 2022-08-02 MED ORDER — ASPIRIN 81 MG CHEWABLE TABLET
81 | Freq: Every day | ORAL | Status: DC
Start: 2022-08-02 — End: 2022-08-02
  Administered 2022-08-02 (×2): 81 mg via ORAL

## 2022-08-02 MED ORDER — ONDANSETRON HCL 4 MG TABLET
4 | Freq: Three times a day (TID) | ORAL | Status: AC | PRN
Start: 2022-08-02 — End: ?

## 2022-08-02 MED ORDER — POLYETHYLENE GLYCOL 3350 17 GRAM ORAL POWDER PACKET
17 | Freq: Every day | ORAL | Status: DC
Start: 2022-08-02 — End: 2022-08-02

## 2022-08-02 MED ORDER — METFORMIN (GLUCOPHAGE) IMMEDIATE RELEASE 250 MG HALFTAB
250 mg | ORAL | Status: DC
  Administered 2022-08-02: 13:00:00 250 mg via ORAL

## 2022-08-02 MED ORDER — OMEGAPURE-600 EC 650 MG-240 MG-360 MG-1,000 MG CAPSULE,DELAYED RELEASE
650 | Freq: Every day | ORAL | Status: AC
Start: 2022-08-02 — End: ?

## 2022-08-02 MED ORDER — SPIRONOLACTONE 50 MG TABLET
50 | Freq: Every day | ORAL | Status: AC
Start: 2022-08-02 — End: ?

## 2022-08-02 MED ORDER — WARFARIN 5 MG TABLET
5 | Status: AC
Start: 2022-08-02 — End: ?

## 2022-08-02 MED ORDER — WARFARIN 2.5 MG TABLET: 2.5 mg | ORAL | Status: DC

## 2022-08-02 MED ORDER — CALCIUM 200 MG (AS CALCIUM CARBONATE 500 MG) CHEWABLE TABLET
500 | ORAL | Status: AC | PRN
Start: 2022-08-02 — End: ?

## 2022-08-02 MED ORDER — FOLIC ACID 1 MG TABLET
1 mg | ORAL | Status: DC
  Administered 2022-08-02: 13:00:00 1 mg via ORAL

## 2022-08-02 MED ORDER — ATORVASTATIN 40 MG TABLET
40 | Freq: Every day | ORAL | Status: AC
Start: 2022-08-02 — End: ?

## 2022-08-02 MED ORDER — DHEA-5 ULTRACAPS
Freq: Every day | ORAL | Status: AC
Start: 2022-08-02 — End: ?

## 2022-08-02 MED ORDER — ALPRAZOLAM 0.5 MG TABLET
0.5 | Freq: Two times a day (BID) | ORAL | Status: AC | PRN
Start: 2022-08-02 — End: ?

## 2022-08-02 MED ORDER — ASPIRIN 81 MG TABLET,DELAYED RELEASE
81 | Freq: Every day | ORAL | Status: AC
Start: 2022-08-02 — End: ?

## 2022-08-02 MED ORDER — ACETAMINOPHEN 325 MG TABLET
325 | ORAL | Status: DC | PRN
Start: 2022-08-02 — End: 2022-08-02

## 2022-08-02 MED ORDER — TRINTELLIX 5 MG TABLET
5 | Freq: Every day | ORAL | Status: AC
Start: 2022-08-02 — End: ?

## 2022-08-02 MED ORDER — METFORMIN ER 750 MG TABLET,EXTENDED RELEASE 24 HR
750 | Freq: Two times a day (BID) | ORAL | Status: AC
Start: 2022-08-02 — End: ?

## 2022-08-02 MED ORDER — SODIUM CHLORIDE 0.9 % (FLUSH) INJECTION SYRINGE
0.9 | Freq: Three times a day (TID) | INTRAVENOUS | Status: DC
Start: 2022-08-02 — End: 2022-08-02
  Administered 2022-08-02 (×2): 0.9 mL via INTRAVENOUS

## 2022-08-02 NOTE — ED Notes
11:40 AM Initial contact done. Patient care assumed.

## 2022-08-02 NOTE — Plan of Care
Physical Therapy EvaluationDefault Flowsheet Data (most recent)   IP PT Discharge Evaluation - 08/02/22 0901    Date of Visit / Treatment  Date of Visit / Treatment 08/02/22   Note Type Evaluation   Start Time 815   End Time 901   Total Treatment Time 45   Co-Treatment Performed Co-treatment performed with separate and distinct therapeutic interventions between PT and OT for concurrent facilitation of balance, postural control, mobility and/or ADL training    Patient Overview  History of Present Illness per chart-  36yF with a PMH of newly discovered APLS with Hx of 2 strokes found in February, depression who presented with L arm weakness in the ulnar aspect of the forearm. Patient was in her usual state of health until about 11pm. She was leaning her elbow on a table and upon standing from the table, she noticed tingling and numbness on the L arm. She denies other symptoms.    Precautions fall   Social History lives with spouse;in a home;stairs present   Prior Level of Function independent with mobility   Subjective Agreeable   General Observations PT consulted for mobility assessment.    Assessment  Cognition (Mentation/Communication) within functional limits;alert;oriented x 4   Vital Signs vital signs stable   Pain Rating 0   Skin Integrity/Edema intact to visibility;see skin documentation   Sensation intact to light touch   Sensation - Additional Details/Comments Minor numbness tingling to L hand/elbow   Range of Motion within functional limits   Muscle Strength/Tone within functional limits   Balance within functional limits;no loss of balance    Functional Mobility  Rolling Complete independence   Supine to/from Sit Complete independence   Sit to/from Stand Complete independence   Sit to/from Stand Device No device   Ambulation Complete independence   Ambulation Device No device   Ambulation Distance 150 feet;x2   Overall Functional Mobility Comments Patient is independent with mobilization. No LOB. Demonstrates adequate strength in order to manage steps.    PT Recommendations for Inpatient Admission  Activity/Level of Assist ambulate;independent   Therapeutic Exercise encourage exercise program issued;ROM as tolerated    Education - Learning Assessment   Education Topic Functional mobility techniques;Safety;Uses of devices/equipment;Disease process;Therapy role/rehab process;Stroke   Learners Patient   Readiness Acceptance   Method Explanation;Demonstration   Response Verbalizes Understanding;Demonstrated Understanding    Clinical Impression / Recommendation  Initial Assessment PT consulted for mobility assessment. Patient presents with fair strength, balance, and ability to mobilize. Currently able to safely transfer and ambulate without issue. No continued PT needs warranted as an inpatient.   PT Frequency Cleared   Physical Therapy Disposition Recommendation Home   Additional Physical Therapy Disposition Recommendations No follow up therapy needs   Equipment Recommendations for Discharge No durable medical equipment needed    Handoff Documentation  Handoff Discussed with nursing;Patient in bed     Harrell Gave DPT

## 2022-08-02 NOTE — ED Notes
10:24 AM Assumed care and report: Cancelled stroke alert. (Hx of two strokes previously). Presented with left arm weakness at 11pm. -A&OX4, GCS15-Ambulatory with steady gait and without difficulty.-PT/OT consult done, as well as speech.-MRI completed.-Pending dispo.Past Medical History: Diagnosis Date  Pancreatitis   4 times last 2006  PCOS (polycystic ovarian syndrome)  11:17 AM Neuro saw pt and pending D/C papers at this time. 11:18 AM PT and care turned over to RN V.E

## 2022-08-02 NOTE — Plan of Care
Problem: Speech Language Pathology GoalsGoal: Cognitive Communication Functioning Goals, SLPDescription: To assess speech language cognition- METNote: Adult Speech and Language PathologySpeech and Language Evaluation7/26/2024Patient Name:  Brandi SiclariMR#:  [id number] Date of Birth:  11-Apr-1987Therapist:  Marco Collie, SLP SLP IP Adult Eval Speech/Lang/Cognition - 08/02/22 0930    Date of Visit/Evaluation  Date of Visit / Treatment 08/02/22   End Time 0930    General Information  Subjective I'm ok   Medical Diagnosis per chart-  36yF with a PMH of newly discovered APLS with Hx of 2 strokes found in February, depression who presented with L arm weakness in the ulnar aspect of the forearm. Patient was in her usual state of health until about 11pm. She was leaning her elbow on a table and upon standing from the table, she noticed tingling and numbness on the L arm. She denies other symptoms.    General Observations Sitting upright in on stretecher, husband present for evaluation   Pertinent History of Current Problem No prior ST in EMR, [place] and MRI no acute findings   Language(s) spoken at home English   Primary Concern Speech Language Cognition   Prior Level of Functioning Lives at home and independent with all IADLs including working    Pain/Comfort  Location #1 - PreTreatment Rating (Numbers Scale) 0/10 - no pain   Posttreatment Rating (Numbers Scale) 0/10 - no pain    Oral/Motor Function  Labial Function WFL - Within Functional Limits   Lingual Function WFL - Within Functional Limits   Facial  WFL - Within Functional Limits   Dentition Adequate    Speech Assessment  Speech  WFL - Within Functional Limits   Dysarthria WFL - Within functional limits   Intelligibility Intelligible   Hearing WFL - Within Functional Limits   Overall Speech Comments WFL motor speech per speech tasks    Verbal Expression   Aphasia None present   Initiation WFL - Within functional limits   Social Utterances WFL - Within Functional Limits   Automatic Sequences WFL - Within Functional Limits   Discourse / Spontaneous Speech WFL - Within Functional Limits   Overall Verbal Expression Comments Informally assessment completed.  Fluent normal utterances w/o any paraphasias    Auditory Comprehension  Yes/No Questions WFL - Within Functional Limits   Commands WFL - Within Functional Limits   Simple Conversation WFL - Within Functional Limits   Overall Auditory Comprehension Comments WFL with personally relevant questions and following all directions    Reading Comprehension  Overall Reading Comprehension Comments Reading texts and emails [facility]    Written Expression  Overall Written Expression Comments Writing texts/emails WFL    Cognition / Mental Functioning       Cognitive Status Comments AxOx4, aware of reason for hospitailzation, tests, plan.    Dysphagia/Swallow Screening  Overall Dysphagia Screening Comments Passed RN swallow screen    Speech Therapy Prognosis  Additional Comments Patient is [facility] motor speech, language and AxOx4.  Does not need further skilled ST services at this time.    Recommendations  SLP Therapy Frequency Evaluation only   Recommendations discussed with: nurse;physician;team;patient    Discharge Summary  Disposition Recommendation(s) No Speech and Language needs anticipated     Brandi Sherman CCC-SLP Plan of Care Overview/ Patient Status

## 2022-08-02 NOTE — ED Notes
7:23 AM Chief Complaint Patient presents with  Numbness   numbness left arm started x1 hr PTA, 11pm pmhx antiphospholipid syndrome, x2 strokes this year. No neuro deficits note, Aox4, full clear sentences, VSS Verbal report received from off-going RN. This RN assumed care of PT at this time. Admitted for left arm numbness. 9:23 AM Pt given breakfast tray.

## 2022-08-02 NOTE — Plan of Care
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 Patient was in her usual state of health until about 11pm. She was leaning her elbow on a table and upon standing from the table, she noticed tingling and numbness on the L arm. She denies other symptoms   Precautions fall   Social History lives with spouse;in a home;stairs present   Prior Level of Function independent with mobility;independent with ADLs   Prior Level of Function - Additional Details/Comments (+) independent in IADLs   Subjective Agreeable to OT   General Observations Patient recieved supine in bed in NAD on RA, agreeable to OT, RN cleared for session.    Assessment  Cognition (Mentation/Communication) within functional limits;alert;oriented x 4   Vital Signs vital signs stable   Pain Rating 0   Skin Integrity/Edema intact to visibility;see skin documentation   Sensation intact to light touch   Sensation - Additional Details/Comments Endorsing mild numbness over ulnar nerve distribution   Range of Motion within functional limits   Muscle Strength/Tone within functional limits   Balance within functional limits;no loss of balance    Functional Mobility  Rolling Complete independence   Supine to/from Sit Complete independence   Sit to/from Stand Complete independence   Sit to/from Stand Device No device   Ambulation Complete independence   Ambulation Device No device   Ambulation Distance 150 feet;x2   Overall Functional Mobility Comments Independent with all functional amb to complete daily routines    Activities of Daily Living  Lower Body Dressing Complete independence   Toileting Complete independence   Grooming Complete independence   Feeding Complete independence     AM-PAC - Daily Activity IP Short Form  Help needed from another person putting on/taking off regular lower body clothing 4 - None   Help needed from another person for bathing (incl. washing, rinsing, drying) 4 - None   Help needed from another person for toileting (incl. using toilet, bedpan, urinal) 4 - None   Help needed from another person putting on/taking off regular upper body clothing 4 - None   Help needed from another person taking care of personal grooming such as brushing teeth 4 - None   Help needed from another person eating meals 4 - None   AM-PAC Daily Activity Raw Score (Total of rows above) 24   CMS Score (based on Raw Score - with G Code) 24 - 0.00% impaired     (G Code - CH)    OT Recommendations for Inpatient Admission  ADL Recommendations independent    Education - Learning Assessment  Education Topic ADL techniques;Safety;Exercise program;Therapy role/rehab process;Discharge planning;Stroke   Learners Patient   Readiness Acceptance   Method Explanation;Demonstration   Response Verbalizes Understanding;Demonstrated Understanding    Clinical Impression / Recommendation  Initial Assessment 7/26 - Patient seen for initial OT evaluation in  ED. Patient presenting to OT functioning at baseline LOF with no ROM, strength, balance, or endurance deficits impacting independence in ADLs or functional mobility. Patient endorsing mild sensation changes over ulnar nerve distribution, administered ulnar nerve glides for patient to complete 3x a day. Patient verbalized and demonstrated understanding of exercise. Able to complete all functional ambulation and functional tasks at baseline LOF during evaluation. Patient does not demonstrate need for skilled OT services during inpatient stay. REC dc home when medically ready.   Patient Goal return home   OT Frequency Cleared   Reason for Discharge (OT Discharge Summary) no further needs identified   Disposition Recommendation Home   Additional Disposition Recommendations No Occupational Therapy needs anticipated   Equipment Recommendations for Discharge No durable medical equipment needed     Sabino Dick, OTR/LOccupational Therapist

## 2022-08-02 NOTE — ED Notes
1:39 AM PT presents to ED as stroke code from walk-in for left arm numbness, LKW 11pm. Hx of strokes and antiphospholipid syndrome. PT denies CP, SOB, ABD pain, NVD, fevers/chills, URI symptoms, or dysuria. PT arrives alert and oriented x4, VSS in NAD, breathing even and unlabored, speaking in clear and complete sentences. PT changed into hospital gown, pending provider eval. Stroke code called by MD [place]. Neuro at bedside.Chief Complaint Patient presents with  Numbness   numbness left arm started x1 hr PTA, 11pm pmhx antifosfolipid syndrome, x2 strokes this year. No neuro deficits note, Aox4, full clear sentences, VSS 5:12 AM PT transported to MRI.7:14 AM Verbal report given, care deferred.

## 2022-08-02 NOTE — Discharge Instructions
Dear Brandi Sherman was a pleasure taking care of you here at [facility]. You were admitted for evaluation of left arm numbness, thought to be due to ulnar nerve entrapment. Our workup noted for no new MRI changes. After leaving the hospital, please continue to take all of your medications as directed, even if you are feeling better. Please follow up with your outpatient hematologist within the next weekMed changes we are making include: nonePlease follow up as below. [place], [id number],[place] [phone] an appointment as soon as possible for a visit in 3 day(s)If you have any questions or concerns please do not hesitate to contact your doctor or return to the emergency room.Wishing you all the best,Your [facility] Care Team

## 2022-08-02 NOTE — ED Provider Notes
Chief Complaint Patient presents with  Numbness   numbness left arm started x1 hr PTA, 11pm pmhx antiphospholipid syndrome, x2 strokes this year. No neuro deficits note, Aox4, full clear sentences, VSS -----------------------------------------------------------------------Resident Note:HPI:Pt is a 37 y.o. female w/ PMH of antiphospholipid, prior stroke presenting with left arm numbness that started at 11:00 p.m., approximately 2-1/2 hours ago.  No other weakness or numbness, denies any change in speech or headache.  She did do a high ropes course today.Arrives as stroke alertBP 116/76  - Pulse 84  - Temp 97.4 ?F (36.3 ?C) (Oral)  - Resp 18  - Ht 5' 6 (1.676 m)  - Wt 99.8 kg  - SpO2 99%  - BMI 35.51 kg/m? PE: Gen:  Awake, sitting up in bedHENT: NC/ATCV: regular rate and rhythm on peripheral examPulm: normal respiratory effort on room air, no tachypneaAbd: soft, non-distended, non-tender, no rebound tenderness or guardingExt: warm, well-perfused, 2+ pulses in all extremitiesNeuro: PERRL, EOMI, no motor or sensory deficits objectively in upper or lower extremities, coordination intact point-to-pointMedical Decision Making: Given the presentation and exam findings above, I considered ICH / CVA_ and will plan for ED stroke workup including [place] / CTA head and neck. Neurology at bedside. ED Course:- [place] head with no ICH by my read- Neurology at bedside, discussed plan with neurology and will plan for MRI and admission to neuro strokeDisposition:  Admit to neuro stroke floorThis patient was presented to and discussed with an emergency medicine attending physician and a treatment plan and disposition were collaboratively agreed upon.Brandi Sherman, MDEmergency Medicine ResidentAvailable on MHB7/26/2024 1:45 AMThis note may have been generated using M*Modal voice dictation software, please excuse any typographical errors due to flaws in voice recognition.--------------------------------------------------------------------------------------------------------------------------------MDM  Physical ExamED Triage Vitals [08/02/22 0022]BP: 122/80Pulse: 87Pulse from  O2 sat: n/aResp: 16Temp: 97.7 ?F (36.5 ?C)Temp src: OralSpO2: 99 % BP 116/76  - Pulse 84  - Temp 97.4 ?F (36.3 ?C) (Oral)  - Resp 18  - Ht 5' 6 (1.676 m)  - Wt 99.8 kg  - SpO2 99%  - BMI 35.51 kg/m? Physical Exam ProceduresAttestation/Critical CarePatient Reevaluation: Attending Supervised: ResidentI saw and examined the patient. I agree with the findings and plan of care as documented in the resident's note except as noted below.MDM: Brandi Sherman is a 37 y.o. female the with a history of antiphospholipid syndrome, prior CVAs presenting for evaluation of left arm numbness that she has began approximately 2 hours prior to arrival to the ED.BP 106/72  - Pulse 83  - Temp 97.4 ?F (36.3 ?C) (Oral)  - Resp 16  - Ht 5' 6 (1.676 m)  - Wt 99.8 kg  - SpO2 97%  - BMI 35.51 kg/m? Ddx:  Ischemic CVA, ICH, cervical radiculopathy, peripheral neuropathy ED course/Dispo:- stroke alert was called given patient's high-risk factors and current presentation- CTA negative - Patient will be admitted to neuro service, ordered for MRI Brandi Sherman, MDClinical Impressions as of 08/02/22 1703 Left arm numbness  ED DispositionAdmit Brandi Sherman, MD07/26/24 0350 Brandi Sherman, MDResident07/26/24 0430 Brandi Sherman, MD07/26/24 1703

## 2022-08-02 NOTE — Utilization Review (ED)
UM Status: Commercial - IPAdmitted with left arm numbness to neurology stroke inpatient team.

## 2022-08-03 ENCOUNTER — Encounter: Admit: 2022-08-03 | Payer: PRIVATE HEALTH INSURANCE

## 2022-08-03 NOTE — Progress Notes
[facility] Care Navigation:  Transition of Care Note Care Navigation spoke with: PatientDischarging Hospital: [facility] Admission Date: 7/26/2024Hospital Discharge Date: 08/02/2022    Diagnosis: Left arm numbnessDischarge location: HomeRisk of Unplanned Readmission: 10.65%HOSPITALIZATION:Reason patient admitted: numbnessDiagnosis at discharge: Left arm numbnessCURRENT STATE:Since discharge patient reports feeling: BetterNew symptoms reported include: Patient stated she is doing fine.  All symptoms have resolvedPatient cared for by: Spouse  REVIEW OF AFTER VISIT SUMMARY DOCUMENT:Patient specific questions regarding discharge: noneMEDICATION CHANGES:Validated NEW medications to take: N/AValidated Changed medications to take: N/AValidated Stopped medications to NOT take: N/AIssues obtaining prescriptions: N/A FOLLOW-UP APPOINTMENTS and TRANSPORTATION:Patient aware of scheduled appointments: YesAwareness and assistance with appointments needing to be scheduled: NoTransportation concerns for follow-up appointment: NoDME and HOME HEALTH SERVICES:Durable medical equipment received: N/AContact has been made with home care agency: N/APlan established for follow up labs/tests: N/A ADDITIONAL PATIENT NEEDS:   Additional patient needs addressed: No needs at all.  All symptoms have resolved.  Appreciates our help and for checking in

## 2022-08-04 NOTE — Discharge Summary
[facility]	Stroke Service Discharge SummaryPatient Data:  Patient Name: Brandi Sherman Age: 37 y.o. DOB: [birth year]	 MRN: [id number]	 Admit date: 7/26/2024Discharge date: 7/26/2024Admitting Attending Physician:Discharge Attending Physician: Brayton El, MD  Consultants: nonePrincipal Diagnosis: Left arm numbnessOther Diagnosis: antiphospholipid syndromePCP: Allwardt, AlyssaDischarged Condition: goodDisposition: Home Issues to be addressed post discharge: Follow up with outpatient hematologist2. Antiplatelet/Anticoagulation continued: continued home warfarin and aspirin3. DVT prophylaxis recommendation: Not clinically indicated, patient is mobilizing independently.4. Statin continued: atorvastatin 40mg 5. 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 Patient was in her usual state of health until about 11pm. She was leaning her elbow on a table and upon standing from the table, she noticed tingling and numbness on the L arm. She denies other symptoms.  Regarding the diagnosis of APLS and prior strokes, patient described that in Dec 2023 she started having migraines for the first time that persisted for weeks. She later developed diplopia and vertigo and got head imaging that showed 2 prior strokes. She did not have any other symptoms and no residual deficits. Stroke work-up at that time showed APLS as well as a small PFO (which she was told was too small to intervene on). She was started on a statin, aspirin and Warfarin (for which she follows with Coumadin clinic weekly). Her last does of warfarin was this evening.  ED evaluation: BP 122/80  - Pulse 87  - Temp 97.7 ?F (36.5 ?C) (Oral)  - Resp 16  - Ht 5' 6 (1.676 m)  - Wt 99.8 kg  - SpO2 99%  - BMI 35.51 kg/m? 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.Maintained on home warfarin and aspirin and discharged home to follow up with outpatient hematologist.Pertinent lab findings and test results: Stroke LabsCBC:Lab Results Component Value Date  WBC 11.7 (H) 08/02/2022  HGB 11.4 (L) 08/02/2022  HCT 32.60 (L) 08/02/2022  MCV 79.7 (L) 08/02/2022  PLT 226 08/02/2022 Comprehensive Metabolic Panel:Lab Results Component Value Date  GLU 90 08/02/2022  BUN 14 08/02/2022  CREATININE 0.73 08/02/2022  NA 136 08/02/2022  K 4.0 08/02/2022  CL 103 08/02/2022  CO2 23 08/02/2022  ALBUMIN 3.6 08/02/2022  PROT 6.7 08/02/2022  BILITOT 0.3 08/02/2022  ALKPHOS 101 08/02/2022  ALT 22 08/02/2022  ALT 16 12/20/2011  GLOB 3.1 08/02/2022  CALCIUM 9.0 08/02/2022  EGFR >60 12/20/2011 Lipid Panel:Lab Results Component Value Date  CHOL 113 08/02/2022  TRIG 96 08/02/2022  LDL 56 08/02/2022  LDL 63 69/62/9528 Other Labs:HbA1C: Lab Results Component Value Date  HGBA1C 5.8 (H) 08/02/2022  HGBA1C 4.5 07/15/2011   TSH:Lab Results Component Value Date  TSH 4.240 (H) 08/02/2022 Less Common Labs:Inflammatory:Lab Results Component Value Date  SEDRATE 12 07/15/2011   Viral:Lab Results Component Value Date  HEPCAB NEGATIVE 09/10/2011 Imaging: Imaging results last 24h: MRI Brain wo IV ContrastResult Date: 7/26/2024No acute infarct, hemorrhage, mass effect or midline shift. [facility] Radiology Notify System Classification: Routine. Report initiated by:  Peggyann Shoals, MD Reported and signed by: Levonne Spiller, MD  [facility] Radiology and Biomedical Imaging CTA Head Neck Stroke Code W IV  ContrastResult Date: 7/26/2024CTA neck demonstrates no hemodynamically significant stenosis, dissection, or pseudoaneurysm. CTA head demonstrates no vessel cut off, significant stenosis or saccular aneurysm. [facility] Radiology Notify System Classification: Routine. Reported and signed by: Levonne Spiller, MD  [facility] Radiology and Biomedical Imaging [place] Head Stroke Code WO IV ContrastResult Date: 08/02/2022  No acute intracranial abnormality. Please note that Noncontrast Head [place] is not sensitive for the detection of ischemic infarct. Please refer to the concomitantly obtained but separately reported CTA for further evaluation.[facility] Radiology Notify System Classification: Routine. Report initiated by:  Cay Schillings, MD Reported and signed by: Park Meo, MD  [facility] Radiology and Biomedical Imaging  Discharge Medications: Current Discharge Medication List  CONTINUE these medications which have NOT CHANGED  Details ALPRAZolam (XANAX) 0.5 mg tablet Take 0.5 tablets (0.25 mg total) by mouth 2 (two) times daily as needed (acute panic).  aspirin 81 mg EC delayed release tablet Take 1 tablet (81 mg total) by mouth daily.  atorvastatin (LIPITOR) 40 mg tablet Take 1 tablet (40 mg total) by mouth daily.  calcium carbonate (TUMS) 500 mg (200 mg calcium) chewable tablet Take 2 tablets (1,000 mg total) by mouth as needed for indigestion.  DHEA-5 ULTRACAPS Take 1 capsule (5 mg total) by mouth daily.  folic acid (FOLVITE) 1 mg tablet Take 1 tablet (1 mg total) by mouth daily.  metFORMIN XR (GLUCOPHAGE-XR) 750 mg 24 hr tablet Take 1 tablet (750 mg total) by mouth 2 (two) times daily.  omega 3-dha-epa-fish oil (OMEGAPURE-600 EC) 650 mg-240 mg- 360 mg-1,000 mg CpDR Take 650 mg by mouth daily.  ondansetron (ZOFRAN) 4 mg tablet Take 1 tablet (4 mg total) by mouth every 8 (eight) hours as needed for nausea.  spironolactone (ALDACTONE) 50 mg tablet Take 1 tablet (50 mg total) by mouth daily.  TRINTELLIX 5 mg tablet Take 1 tablet (5 mg total) by mouth daily.  warfarin (COUMADIN) 5 mg tablet Take 15 mg by mouth daily, except Mon & Fri take 10 mg.  desogestrel-ethinyl estradiol (RECLIPSEN, 28,) 0.15-0.03 mg Tab Take 1 tablet by mouth daily.Qty: 28 each, Refills: 4  metFORMIN (GLUCOPHAGE) 500 MG tablet Take 1 tab q day for 1 wk, then 2 tabs q day for 1 wk, then 3 tabs a day for 1 wk, then 4 tabs a dayQty: 240 tablet, Refills: 11   STOP taking these medications   acetaminophen (TYLENOL) 325 mg tablet    Allergies No Known Allergies PMH PSH Past Medical History: Diagnosis Date  Pancreatitis   4 times last 2006  PCOS (polycystic ovarian syndrome)   Past Surgical History: Procedure Laterality Date  UPPER GASTROINTESTINAL ENDOSCOPY  2006  Social History Family History Social History Tobacco Use  Smoking status: Light Smoker  Smokeless tobacco: Not on file  Tobacco comments:   1 cigarette occasionally when out Substance Use Topics  Alcohol use: Yes  Family History Problem Relation Age of Onset  Depression Mother   Diabetes Father   Heart disease Father   Heart attack Father   Discharge vitals: Blood pressure 116/76, pulse 84, temperature 97.4 ?F (36.3 ?C), temperature source Oral, resp. rate 18, height 5' 6 (1.676 m), weight 99.8 kg, SpO2 99 %, not currently breastfeeding.Discharge NIHSS: 0Discharge Physical Exam:Physical exam was performed on the day of discharge. Neurological Exam:MS: AO to self, month/year, place, situation Naming intact to high and low frequency objects Repetition intact Comprehension intact. Follows 2 step and cross body commands Speech fluent and appropriate without dysarthria CN: Pupils equal and  reactiveVisual fields fullEOMI without nystagmus V1-V3 intact to LT Hearing intact Facial movements symmetric TUP midline Motor/Movement: Normal tone and  bulkno abnormal movementsno orbitingUEMuscle/Movement Root Nerve Right Left Deltoids C5 Axillary 5 5 Biceps C5/6 Musculocut. 5 5 Triceps C7 Radial 5 5 Wrist ext. (rad) C6 Radial 5 5 Wrist flex C6/7 Median (AIN) 5 5 Finger abd T1 Ulnar 5 5 LEMuscle/Movement   Root Nerve Right Left Hip flexors L1/2 Femoral 5 5 Knee ext L3/4 Femoral 5 5 Knee flex S1 Sciatic 5 5 Ankle dorsi L5 Deep peroneal 5 5 Ankle plantar S1/2 Tibial 5 5 Toe ext. L5 Deep peroneal 5 5  Coordination:  FNF without dysmetria HTS without dysmetria Reflexes: 2+ biceps, triceps, BR 2+ patella 2+ achilles No clonus Toes down bilaterally Sensory: Light touch: intact in all 4 extremities Temperature: mildly diminished on ulnar aspect of left handExtinction: absent Sensory level: absent Gait: Deferred - PCP & outpatient specialist recommendations:- PCP follow up - hematology follow up	Follow-up Information:[place], [id number],[place] [phone] an appointment as soon as possible for a visit in 3 day(s) Electronically Signed:Yianna Tersigni Osei-Bonsu,MDResident,  PGY-3[facility] NeurologyAvailable on MHB7/26/2024 Addendum : NONE

## 2022-08-04 NOTE — Other
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 Patient was in her usual state of health until about 11pm. She was leaning her elbow on a table and upon standing from the table, she noticed tingling and numbness on the L arm. She denies other symptoms. Regarding the diagnosis of APLS and prior strokes, patient described that in Dec 2023 she started having migraines for the first time that persisted for weeks. She later developed diplopia and vertigo and got head imaging that showed 2 prior strokes. She did not have any other symptoms and no residual deficits. Stroke work-up at that time showed APLS as well as a small PFO (which she was told was too small to intervene on). She was started on a statin, aspirin and Warfarin (for which she follows with Coumadin clinic weekly). Her last does of warfarin was this evening. Clinical Evaluation:NIHSS1a. Level of consciousness 0 - Alert 1b. Level of consciousness questions 0 - Answers both correctly 1c. Level of consciousness command 0 - Executes both correctly 2.   Best gaze 0 - Normal 3.   Visual 0 - No visual loss 4.   Facial palsy 0 - Normal 5a. Motor left arm 0 - No drift 5b. Motor right arm 0 - No drift 6a. Motor left leg 0 - No drift 6b. Motor right leg 0 - No drift 7.   Limb ataxia 0 - Absent 8. Sensory 0 - Normal 9.   Best language 0 - No aphasia 10. Dysarthria  0 - Normal articulation 11. Extinction & Inattention 0 - No neglect NIHSS Total Score 0 Neuroimaging:Modality: [place] and CTAImage findings: no acute ischemic changes and no hemorrhageAny large-vessel occlusion: NoSymptomatic ICA Stenosis > 70%: NoMedical History: PMH PSH Past Medical History: Diagnosis Date  Pancreatitis   4 times last 2006  PCOS (polycystic ovarian syndrome)   Past Surgical History: Procedure Laterality Date  UPPER GASTROINTESTINAL ENDOSCOPY  2006  Family History family history includes Depression in her mother; Diabetes in her father; Heart attack in her father; Heart disease in her father.Social History  reports that she has been smoking. She does not have any smokeless tobacco history on file. She reports current alcohol use. She reports that she does not use drugs.Prior to Admission Medications (Not in a hospital admission) Allergies No Known Allergies Medications Scheduled Meds:No current facility-administered medications for this encounter. Continuous Infusions:PRN Meds:. Review of Systems: Review of SystemsObjective: Vitals:Last 24 hours: Temp:  [97.7 ?F (36.5 ?C)] 97.7 ?F (36.5 ?C)Pulse:  [87] 87Resp:  [16] 16BP: (122)/(80) 122/80SpO2:  [99 %] 99 %Labs: Recent Labs Lab 07/26/240143 HCT 35.0*  No results for input(s): NEUTROPHILS, LABLYMP, LABEOS, BANDSP in the last 168 hours. Recent Labs Lab 07/26/240143 K 4.0 CREATININE 0.8 GLU 96  No results for input(s): CALCIUM, MG, PHOS in the last 168 hours. No results for input(s): ALT, AST, ALKPHOS, BILITOT, BILIDIR in the last 168 hours. No results for input(s): PTT, LABPROT, INR in the last 168 hours. No results for input(s): LABBLOO, LABURIN, LOWERRESPIRA in the last 168 hours.Physical Exam General: Appears stated  age, well-appearing, in NAD.HEENT: Normocephalic, atraumatic. sclera anicteric. Moist mucus membranes. Oropharynx without erythema or lesions.Pulm: breathing comfortably on RAAbd: non-distended abdomenExt: Warm and perfused, no LE pitting edemaSkin: No rashes, lesions.Neuro Exam:Mental Status: awake, alert and oriented to month/year, [facility], situation. Naming, repetition, comprehension intact. Speech is fluent and without dysarthria. Cranial Nerves: PERRL, EOMI, no nystagmus, Visual fields full, V1-V3 intact to LT, facial movements symmetric, no facial droop, tongue and palate midline, SCM/traps 5/5Motor: normal bulk and tone, no abnormal movements, no drift.  -RUE: 5/5 shoulder, biceps, triceps, grip, wrist extension, finger abduction against resistance -LUE: 5/5 shoulder, biceps, triceps, grip, wrist extension, finger abduction against resistance -RLE: 5/5 hip, knee flexion/extension, dorsiflexion, plantar flexion -LLE: 5/5 hip, knee flexion/extension, dorsiflexion, plantar flexionCoordination:  FNF intact without dysmetria bilaterally Sensory: intact to LT in upper and lower extremities, equal throughout. Slightly diminished sensation in distal ulnar distribution of L arm compared to proximal L arm. Tinel sign positive on percussion of L elbow leading to paraesthesia in the ulnar distribution of the L forearm and 4th+5th fingers.Gait: deferredAssessment and Plan Assessment: CTH and CTA negative for acute intracranial abnormalities or LVO. 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 TNK was not administered int the setting of warfarin use and the fact that symptoms were not disabling. Other Stroke Details: - obtain MRI Brain without Contrast- admit to inpatient stroke under Dr. Raleigh Callas- SBP goal permissive- TTE w/ bubble: patinet had TTE that showed a small PFO- Follow up HbA1c, Lipid panel, TSH- Follow up utox, U/A- Cont home systemic AP/AC: aspirin 81mg  and warfarin 10mg - Cont rosuvastatin 20mg  daily - Recommend PT, OT, & ST- Continue NIHSS, pupil checks, and vitals q4hrs- Maintain euglycemia, eunatremia, euthermia- telemetry monitoring to evaluate for afibSigned:Mariselda Badalamenti Osei-Bonsu, MDPGY2 Neurology Resident7/26/2024 3:56 AM

## 2022-08-05 ENCOUNTER — Ambulatory Visit: Payer: 59

## 2022-08-12 ENCOUNTER — Ambulatory Visit (INDEPENDENT_AMBULATORY_CARE_PROVIDER_SITE_OTHER): Payer: 59

## 2022-08-12 DIAGNOSIS — Z7901 Long term (current) use of anticoagulants: Secondary | ICD-10-CM

## 2022-08-12 LAB — POCT INR: INR: 2.5 (ref 2.0–3.0)

## 2022-08-12 NOTE — Progress Notes (Signed)
Pt in ER on 7/26 for arm numbness and tingling. Diagnosed with pinched nerve.  Pt will start tumeric today, will recheck INR in one week due to potential interaction. Pt is also going to talk to therapist about stating buspirone. No interaction with this medication. Continue 3 tablets daily except take 2 tablets on Mondays and Fridays. Recheck in 1 weeks, on 08/19/22 at [place], [address].

## 2022-08-12 NOTE — Patient Instructions (Addendum)
Pre visit review using our clinic review tool, if applicable. No additional management support is needed unless otherwise documented below in the visit note.  Continue 3 tablets daily except take 2 tablets on Mondays and Fridays. Recheck in 1 weeks, on 08/19/22 at [place], [address].

## 2022-08-14 ENCOUNTER — Other Ambulatory Visit: Payer: Self-pay | Admitting: Physician Assistant

## 2022-08-14 ENCOUNTER — Encounter: Payer: Self-pay | Admitting: Physician Assistant

## 2022-08-14 ENCOUNTER — Ambulatory Visit (INDEPENDENT_AMBULATORY_CARE_PROVIDER_SITE_OTHER): Payer: 59 | Admitting: Physician Assistant

## 2022-08-14 VITALS — BP 128/74 | HR 76 | Temp 97.7°F | Ht 66.54 in | Wt 228.4 lb

## 2022-08-14 DIAGNOSIS — I639 Cerebral infarction, unspecified: Secondary | ICD-10-CM

## 2022-08-14 DIAGNOSIS — Z0001 Encounter for general adult medical examination with abnormal findings: Secondary | ICD-10-CM | POA: Diagnosis not present

## 2022-08-14 DIAGNOSIS — R0982 Postnasal drip: Secondary | ICD-10-CM

## 2022-08-14 DIAGNOSIS — Z0000 Encounter for general adult medical examination without abnormal findings: Secondary | ICD-10-CM

## 2022-08-14 DIAGNOSIS — R946 Abnormal results of thyroid function studies: Secondary | ICD-10-CM | POA: Diagnosis not present

## 2022-08-14 DIAGNOSIS — Z8742 Personal history of other diseases of the female genital tract: Secondary | ICD-10-CM

## 2022-08-14 DIAGNOSIS — Z6836 Body mass index (BMI) 36.0-36.9, adult: Secondary | ICD-10-CM

## 2022-08-14 DIAGNOSIS — D649 Anemia, unspecified: Secondary | ICD-10-CM | POA: Diagnosis not present

## 2022-08-14 DIAGNOSIS — J309 Allergic rhinitis, unspecified: Secondary | ICD-10-CM

## 2022-08-14 DIAGNOSIS — L918 Other hypertrophic disorders of the skin: Secondary | ICD-10-CM | POA: Diagnosis not present

## 2022-08-14 DIAGNOSIS — E88819 Insulin resistance, unspecified: Secondary | ICD-10-CM

## 2022-08-14 LAB — CBC WITH DIFFERENTIAL/PLATELET
Basophils Absolute: 0.1 10*3/uL (ref 0.0–0.1)
Basophils Relative: 1 % (ref 0.0–3.0)
Eosinophils Absolute: 0.1 10*3/uL (ref 0.0–0.7)
Eosinophils Relative: 1.2 % (ref 0.0–5.0)
HCT: 39.9 % (ref 36.0–46.0)
Hemoglobin: 13.3 g/dL (ref 12.0–15.0)
Lymphocytes Relative: 27.1 % (ref 12.0–46.0)
Lymphs Abs: 3.1 10*3/uL (ref 0.7–4.0)
MCHC: 33.4 g/dL (ref 30.0–36.0)
MCV: 81.5 fl (ref 78.0–100.0)
Monocytes Absolute: 0.8 10*3/uL (ref 0.1–1.0)
Monocytes Relative: 6.6 % (ref 3.0–12.0)
Neutro Abs: 7.3 10*3/uL (ref 1.4–7.7)
Neutrophils Relative %: 64.1 % (ref 43.0–77.0)
Platelets: 253 10*3/uL (ref 150.0–400.0)
RBC: 4.89 Mil/uL (ref 3.87–5.11)
RDW: 15.3 % (ref 11.5–15.5)
WBC: 11.4 10*3/uL — ABNORMAL HIGH (ref 4.0–10.5)

## 2022-08-14 LAB — TSH: TSH: 2.75 u[IU]/mL (ref 0.35–5.50)

## 2022-08-14 LAB — T3, FREE: T3, Free: 3.3 pg/mL (ref 2.3–4.2)

## 2022-08-14 LAB — T4, FREE: Free T4: 0.73 ng/dL (ref 0.60–1.60)

## 2022-08-14 MED ORDER — WEGOVY 0.25 MG/0.5ML ~~LOC~~ SOAJ
0.2500 mg | SUBCUTANEOUS | 1 refills | Status: DC
Start: 2022-08-14 — End: 2022-09-11

## 2022-08-14 NOTE — Progress Notes (Signed)
Labs all back to normal range, thanks!

## 2022-08-14 NOTE — Assessment & Plan Note (Signed)
Still working on lifestyle changes Plan to add [facility] for additional help with weight loss Pt aware of risks vs benefits and possible adverse reactions Pt to f/up via MyChart in a few weeks with update

## 2022-08-14 NOTE — Progress Notes (Addendum)
Subjective:    Patient ID: Kristen Hoover, female    DOB: October 02, 1985, 37 y.o.   MRN: [id number]  Chief Complaint  Patient presents with   Annual Exam    HPI Patient is in today for annual exam.  Health maintenance: Lifestyle/ exercise: gym at home, just trying to get into routine Nutrition: not eating well per patient, just nervous about our food sources Mental health: doing better  Sleep: doing good  Substance use: marijuana  Sexual activity: monogamous  Immunizations: UTD  Pap: UTD Colon cancer screening: she will have this done this year, has been followed with GI Skin: skin tags - itching and rubbing on clothing, requesting removal; and one mole on back    Past Medical History:  Diagnosis Date   Anxiety and depression    Frequency of urination    GERD (gastroesophageal reflux disease)    History of blood clots    History of pancreatitis    12-28-2018-  last episode 2006   PCOS (polycystic ovarian syndrome)    Renal calculus, right    Stroke [facility])    Urgency of urination    Wears glasses     Past Surgical History:  Procedure Laterality Date   BUBBLE STUDY  03/22/2022   Procedure: BUBBLE STUDY;  Surgeon: Lewayne Bunting, MD;  Location: [facility] ENDOSCOPY;  Service: Cardiovascular;;   CYSTOSCOPY WITH RETROGRADE PYELOGRAM, URETEROSCOPY AND STENT PLACEMENT Right 01/05/2019   Procedure: CYSTOSCOPY/URETEROSCOP/STENT PLACEMENT;  Surgeon: Noel Christmas, MD;  Location: [facility] ;  Service: Urology;  Laterality: Right;  75 MINS   CYSTOSCOPY WITH RETROGRADE PYELOGRAM, URETEROSCOPY AND STENT PLACEMENT Right 02/09/2019   Procedure: CYSTOSCOPY WITH RETROGRADE PYELOGRAM, URETEROSCOPY AND STENT PLACEMENT;  Surgeon: Noel Christmas, MD;  Location: [facility];  Service: Urology;  Laterality: Right;  1 HR   ESOPHAGOGASTRODUODENOSCOPY  2015   HOLMIUM LASER APPLICATION Right 02/09/2019   Procedure: HOLMIUM LASER APPLICATION;  Surgeon: Noel Christmas, MD;  Location: [facility];  Service: Urology;  Laterality: Right;   TEE WITHOUT CARDIOVERSION N/A 03/22/2022   Procedure: TRANSESOPHAGEAL ECHOCARDIOGRAM (TEE);  Surgeon: Lewayne Bunting, MD;  Location: [facility] ENDOSCOPY;  Service: Cardiovascular;  Laterality: N/A;   TONSILLECTOMY  2014   WISDOM TOOTH EXTRACTION  2018    Family History  Problem Relation Age of Onset   Fibromyalgia Mother    Headache Mother    Neuropathy Father    Diabetes Father    Heart failure Father    Diabetes Brother    Breast cancer Maternal Grandmother    Colon cancer Paternal Grandfather    Liver disease Neg Hx    Esophageal cancer Neg Hx     Social History   Tobacco Use   Smoking status: Never   Smokeless tobacco: Never  Vaping Use   Vaping status: Never Used  Substance Use Topics   Alcohol use: Not Currently   Drug use: Not Currently    Types: Marijuana    Comment: 12-28-2018  per pt last used 5 wks ago     No Known Allergies  Review of Systems NEGATIVE UNLESS OTHERWISE INDICATED IN HPI      Objective:     BP 128/74   Pulse 76   Temp 97.7 F (36.5 C) (Temporal)   Ht 5' 6.54" (1.69 m)   Wt 228 lb 6.4 oz (103.6 kg)   SpO2 97%   BMI 36.27 kg/m   Wt Readings from Last 3 Encounters:  08/14/22 228 lb 6.4 oz (103.6 kg)  07/10/22 231 lb (104.8 kg)  06/21/22 230 lb (104.3 kg)    BP Readings from Last 3 Encounters:  08/14/22 128/74  07/10/22 115/76  06/21/22 112/62     Physical Exam Vitals and nursing note reviewed.  Constitutional:      Appearance: Normal appearance. She is normal weight. She is not toxic-appearing.  HENT:     Head: Normocephalic and atraumatic.     Right Ear: Tympanic membrane, ear canal and external ear normal.     Left Ear: Tympanic membrane, ear canal and external ear normal.     Nose: Nose normal.     Mouth/Throat:     Mouth: Mucous membranes are moist.  Eyes:     Extraocular Movements: Extraocular movements intact.      Conjunctiva/sclera: Conjunctivae normal.     Pupils: Pupils are equal, round, and reactive to light.  Cardiovascular:     Rate and Rhythm: Normal rate and regular rhythm.     Pulses: Normal pulses.     Heart sounds: Normal heart sounds.  Pulmonary:     Effort: Pulmonary effort is normal.     Breath sounds: Normal breath sounds.  Abdominal:     General: Abdomen is flat. Bowel sounds are normal.     Palpations: Abdomen is soft.  Musculoskeletal:        General: Normal range of motion.     Cervical back: Normal range of motion and neck supple.  Skin:    General: Skin is warm and dry.     Findings: Lesion (2 benign skin tags R axilla, one skin tag L axilla, all less than 2 mm) present.  Neurological:     General: No focal deficit present.     Mental Status: She is alert and oriented to person, place, and time.  Psychiatric:        Mood and Affect: Mood normal.        Behavior: Behavior normal.        Thought Content: Thought content normal.        Judgment: Judgment normal.        Assessment & Plan:  Encounter for annual physical exam -     TSH -     T4, free -     T3, free -     CBC with Differential/Platelet  Low hemoglobin -     CBC with Differential/Platelet  Abnormal thyroid function test -     TSH -     T4, free -     T3, free  Cerebrovascular accident (CVA), unspecified mechanism (HCC) -     ZHYQMV; Inject 0.25 mg into the skin once a week.  Dispense: 2 mL; Refill: 1  Class 2 severe obesity with serious comorbidity and body mass index (BMI) of 36.0 to 36.9 in adult, unspecified obesity type [facility]) Assessment & Plan: Still working on lifestyle changes Plan to add [facility] for additional help with weight loss Pt aware of risks vs benefits and possible adverse reactions Pt to f/up via MyChart in a few weeks with update  Orders: -     HQIONG; Inject 0.25 mg into the skin once a week.  Dispense: 2 mL; Refill: 1  Insulin resistance -     Wegovy; Inject 0.25 mg  into the skin once a week.  Dispense: 2 mL; Refill: 1  History of PCOS -     Wegovy; Inject 0.25 mg into the skin once a week.  Dispense: 2 mL; Refill: 1  Allergic rhinitis with postnasal drip -     Ambulatory referral to Allergy  Skin tags, multiple acquired    Skin tags procedure: Itching, rubbing on clothes, bothering patient, agreed with removal today. Procedure explained, consent obtained. Cryotherapy performed on 3 skin tags. Two freeze-thaw cycles with ice ring formation and deformation in between each cycle. Pt tolerated procedure well. Aftercare instructions provided.   Patient Counseling: [x]   Nutrition: Stressed importance of moderation in sodium/caffeine intake, saturated fat and cholesterol, caloric balance, sufficient intake of fresh fruits, vegetables, and fiber.  [x]   Stressed the importance of regular exercise.   [x]   Substance Abuse: Discussed cessation/primary prevention of tobacco, alcohol, or other drug use; driving or other dangerous activities under the influence; availability of treatment for abuse.   []   Injury prevention: Discussed safety belts, safety helmets, smoke detector, smoking near bedding or upholstery.   []   Sexuality: Discussed sexually transmitted diseases, partner selection, use of condoms, avoidance of unintended pregnancy  and contraceptive alternatives.   [x]   Dental health: Discussed importance of regular tooth brushing, flossing, and dental visits.  [x]   Health maintenance and immunizations reviewed. Please refer to Health maintenance section.       Return in about 6 months (around 02/14/2023) for recheck/follow-up.    M , PA-C

## 2022-08-15 ENCOUNTER — Other Ambulatory Visit (HOSPITAL_COMMUNITY): Payer: Self-pay

## 2022-08-16 ENCOUNTER — Telehealth: Payer: Self-pay

## 2022-08-16 ENCOUNTER — Encounter: Payer: Self-pay | Admitting: Physician Assistant

## 2022-08-16 NOTE — Telephone Encounter (Signed)
Pharmacy Patient Advocate Encounter  Received notification from [facility] that Prior Authorization for Wegovy0.25mg /0.1ml has been DENIED. Please advise how you'd like to proceed. Full denial letter will be uploaded to the media tab. See denial reason below.   PA #/Case ID/Reference #: [id number]

## 2022-08-16 NOTE — Telephone Encounter (Signed)
Pharmacy Patient Advocate Encounter   Received notification from RX Request Messages that prior authorization for [facility] 0.25mg /0.44ml is required/requested.   Insurance verification completed.   The patient is insured through [facility] .   Per test claim: PA required; PA submitted to [facility] via CoverMyMeds Key/confirmation #/EOC [facility] Status is pending

## 2022-08-19 ENCOUNTER — Other Ambulatory Visit: Payer: Self-pay | Admitting: Physician Assistant

## 2022-08-19 ENCOUNTER — Encounter: Payer: Self-pay | Admitting: Gastroenterology

## 2022-08-19 ENCOUNTER — Ambulatory Visit (INDEPENDENT_AMBULATORY_CARE_PROVIDER_SITE_OTHER): Payer: 59

## 2022-08-19 DIAGNOSIS — E88819 Insulin resistance, unspecified: Secondary | ICD-10-CM

## 2022-08-19 DIAGNOSIS — Z6836 Body mass index (BMI) 36.0-36.9, adult: Secondary | ICD-10-CM

## 2022-08-19 DIAGNOSIS — Z8742 Personal history of other diseases of the female genital tract: Secondary | ICD-10-CM

## 2022-08-19 DIAGNOSIS — Z7901 Long term (current) use of anticoagulants: Secondary | ICD-10-CM | POA: Diagnosis not present

## 2022-08-19 DIAGNOSIS — I639 Cerebral infarction, unspecified: Secondary | ICD-10-CM

## 2022-08-19 LAB — POCT INR: INR: 3 (ref 2.0–3.0)

## 2022-08-19 NOTE — Telephone Encounter (Signed)
Please see message and advise. Medication was also denied to due not on listing of formulary or approved drugs for plan.

## 2022-08-19 NOTE — Progress Notes (Signed)
Pt started buspirone yesterday. No interaction with warfarin. Pt restarted tumeric one week ago. This does have an interactions with warfarin and will increase bleeding risk.  Advised pt since she is at the top of her range and the tumeric could increase INR more, she should be rechecked in 2 weeks. Pt agreed. Advised if any s/s of abnormal bruising or bleeding to go to the ER. Pt verbalized understanding.  Continue 3 tablets daily except take 2 tablets on Mondays and Fridays. Recheck in 2 weeks, on 09/02/22 at [place], [address].

## 2022-08-19 NOTE — Patient Instructions (Addendum)
Pre visit review using our clinic review tool, if applicable. No additional management support is needed unless otherwise documented below in the visit note.  Continue 3 tablets daily except take 2 tablets on Mondays and Fridays. Recheck in 2 weeks, on 09/02/22 at [place], [address].

## 2022-08-19 NOTE — Telephone Encounter (Signed)
Pharmacy team sent a denial for pt RX for the [facility] . How would you like to proceed ?

## 2022-08-21 NOTE — Telephone Encounter (Signed)
Please see PCP message and advise

## 2022-08-22 ENCOUNTER — Telehealth: Payer: Self-pay

## 2022-08-22 ENCOUNTER — Other Ambulatory Visit (HOSPITAL_COMMUNITY): Payer: Self-pay

## 2022-08-22 NOTE — Telephone Encounter (Signed)
Pharmacy Patient Advocate Encounter   Received notification from Pt Calls Messages that an appeal for Wegovy 0.25mg /0.65ml is required/requested.   An expedited appeal was faxed to [employer] at [phone].  Status is pending.

## 2022-09-02 ENCOUNTER — Ambulatory Visit: Payer: 59

## 2022-09-04 ENCOUNTER — Ambulatory Visit (INDEPENDENT_AMBULATORY_CARE_PROVIDER_SITE_OTHER): Payer: 59

## 2022-09-04 ENCOUNTER — Other Ambulatory Visit (HOSPITAL_COMMUNITY): Payer: Self-pay

## 2022-09-04 DIAGNOSIS — Z7901 Long term (current) use of anticoagulants: Secondary | ICD-10-CM

## 2022-09-04 LAB — POCT INR: INR: 2.5 (ref 2.0–3.0)

## 2022-09-04 NOTE — Patient Instructions (Addendum)
Pre visit review using our clinic review tool, if applicable. No additional management support is needed unless otherwise documented below in the visit note.  Continue 3 tablets daily except take 2 tablets on Mondays and Fridays. Recheck in 4 weeks.

## 2022-09-04 NOTE — Progress Notes (Addendum)
Pt reports she is going to start vitamin D and B12. She also reports she has a colonoscopy scheduled for 10/8. Advised a Lovenox bridge will be needed and instructions will be provided at her next INR check on 9/23. Late entry; Pt reports bleeding gum once and vaginal spotting once in the last two weeks. Advised if this occurs again to contact the office. Pt verbalized understanding.  Continue 3 tablets daily except take 2 tablets on Mondays and Fridays. Recheck in 4 weeks.

## 2022-09-11 ENCOUNTER — Other Ambulatory Visit: Payer: Self-pay

## 2022-09-11 DIAGNOSIS — Z8742 Personal history of other diseases of the female genital tract: Secondary | ICD-10-CM

## 2022-09-11 DIAGNOSIS — I639 Cerebral infarction, unspecified: Secondary | ICD-10-CM

## 2022-09-11 DIAGNOSIS — E88819 Insulin resistance, unspecified: Secondary | ICD-10-CM

## 2022-09-11 MED ORDER — WEGOVY 0.25 MG/0.5ML ~~LOC~~ SOAJ: 0.2500 mg | SUBCUTANEOUS | 1 refills | Status: DC

## 2022-09-12 ENCOUNTER — Other Ambulatory Visit: Payer: Self-pay | Admitting: Physician Assistant

## 2022-09-12 DIAGNOSIS — F32A Depression, unspecified: Secondary | ICD-10-CM

## 2022-09-13 ENCOUNTER — Other Ambulatory Visit (HOSPITAL_COMMUNITY): Payer: Self-pay

## 2022-09-13 NOTE — Telephone Encounter (Signed)
Pharmacy Patient Advocate Encounter  Received notification from  [employer]  that an appeal for Reginal Lutes has been APPROVED from 08/24/22 to 08/24/23   PA #/Case ID/Reference #: [id number]  Approval letter indexed to media tab

## 2022-09-17 ENCOUNTER — Encounter: Payer: Self-pay | Admitting: Allergy & Immunology

## 2022-09-17 ENCOUNTER — Telehealth: Payer: Self-pay

## 2022-09-17 ENCOUNTER — Other Ambulatory Visit: Payer: Self-pay

## 2022-09-17 ENCOUNTER — Ambulatory Visit (INDEPENDENT_AMBULATORY_CARE_PROVIDER_SITE_OTHER): Payer: 59 | Admitting: Allergy & Immunology

## 2022-09-17 ENCOUNTER — Other Ambulatory Visit: Payer: Self-pay | Admitting: Allergy & Immunology

## 2022-09-17 VITALS — BP 132/86 | HR 89 | Temp 97.3°F | Resp 18 | Ht 66.0 in | Wt 233.5 lb

## 2022-09-17 DIAGNOSIS — R112 Nausea with vomiting, unspecified: Secondary | ICD-10-CM

## 2022-09-17 DIAGNOSIS — J310 Chronic rhinitis: Secondary | ICD-10-CM | POA: Diagnosis not present

## 2022-09-17 DIAGNOSIS — K219 Gastro-esophageal reflux disease without esophagitis: Secondary | ICD-10-CM | POA: Diagnosis not present

## 2022-09-17 MED ORDER — FAMOTIDINE 40 MG PO TABS: 40.0000 mg | ORAL_TABLET | Freq: Every day | ORAL | 0 refills | Status: DC

## 2022-09-17 NOTE — Patient Instructions (Addendum)
1. Chronic rhinitis - We will do testing NEXT TUESDAY at 3pm. - We will know more then. - Start Pepcid 40mg  daily (I sent that in to your pharmacy).   2. Return in about 1 week (around 09/24/2022).    Please inform us of any Emergency Department visits, hospitalizations, or changes in symptoms. Call us before going to the ED for breathing or allergy symptoms since we might be able to fit you in for a sick visit. Feel free to contact us anytime with any questions, problems, or concerns.  It was a pleasure to meet you today!  Websites that have reliable patient information: 1. American Academy of Asthma, Allergy, and Immunology: www.aaaai.org 2. Food Allergy Research and Education (FARE): foodallergy.org 3. Mothers of Asthmatics: http://www.asthmacommunitynetwork.org 4. American College of Allergy, Asthma, and Immunology: www.acaai.org   COVID-19 Vaccine Information can be found at: PodExchange.nl For questions related to vaccine distribution or appointments, please email [email or web address] or call [phone].     "Like" [place] on Facebook and Instagram for our latest updates!      A healthy democracy works best when [employer] participate! Make sure you are registered to vote! If you have moved or changed any of your contact information, you will need to get this updated before voting! Scan the QR codes below to learn more!

## 2022-09-17 NOTE — Telephone Encounter (Signed)
Left message for pt to call back  °

## 2022-09-17 NOTE — Progress Notes (Signed)
NEW PATIENT  Date of Service/Encounter:  09/17/22  Consult requested by: Allwardt, Crist Infante, PA-C   Assessment:   Chronic rhinitis - scheduling for skin testing next week  Nausea and vomiting - likely secondary to marked mucous production versus GERD  Gastroesophageal reflux disease  Antiphospholipid syndrome - on Coumadin  Polycystic ovarian syndrome  Obesity - starting [id number]  Plan/Recommendations:   1. Chronic rhinitis - We will do testing NEXT TUESDAY at 3pm. - We will know more then. - Start Pepcid 40mg  daily (I sent that in to your pharmacy).  - This could all be related to silent reflux that gets worse at night and causes inflammation and mucus production with goblet cell hyperplasia. - I think that the allergy testing will be very enlightening. - We can be more aggressive with medications afterwards versus allergy shots, if indicated.  2. Return in about 1 week (around 09/24/2022).    This note in its entirety was forwarded to the Provider who requested this consultation.  Subjective:   Kristen Hoover is a 36 y.o. female presenting today for evaluation of  Chief Complaint  Patient presents with   Allergic Rhinitis     Constant post nasal drip with cough nausea and vomiting - 5 years     Ryatt Style has a history of the following: Patient Active Problem List   Diagnosis Date Noted   Class 2 severe obesity with serious comorbidity and body mass index (BMI) of 36.0 to 36.9 in adult [facility]) 08/14/2022   Long term (current) use of anticoagulants [Z79.01] 05/15/2022   Antiphospholipid antibody positive 04/11/2022   Cerebrovascular accident (CVA) (HCC) 03/15/2022   Abnormal brain MRI 02/13/2022   Persistent depressive disorder 01/30/2022   Left-sided headache 01/30/2022   Vitamin D deficiency 03/02/2020   History of PCOS 03/01/2020   Anxiety and depression 03/01/2020   Attention and concentration deficit 03/01/2020    History obtained  from: chart review and patient.  Kristen Hoover was referred by Allwardt, Crist Infante, PA-C.    Kristen Hoover is a 37 y.o. female presenting for an evaluation of emesis as well as postnasal drip .  Allergic Rhinitis Symptom History: She has postnasal drip that leads to emesis in the morning time. This is at least weekly. She has a chronic wet cough that is primarily in the morning and occasionally throughout the day. This is all year and does not seem to be worse during a particular time of the year.  She has lived here since 2015. She was previously in [place] and never had a problem before the last 5 years or so. She has just been dealing with it instead of getting it addressed. She has tried using nose sprays in the past, but she does not recall whether it worked or if there were weird side effects. Review of the medications shows that she had Astelin prescribed. She gave it a "good senior try" (rather than a college try), so she admits that she did not try hard.   She has never seen ENT. She decided to see an allergist first.  Food Allergy Symptom History: She did have a "food allergy test" done a few years ago. She was told that she was allergic to certain cheeses. She thinks that she was "allergic" to other foods, but she never followed the journal. She does report some inflammation with dairy exposure, but otherwise nothing too serious. She has never needed an epinephrine injector for this.   She had some  TIAs earlier this year. She presented with "bad migraines", double vision, floaters, vertigo, and floaters. She has been diagnosed with anti-phospholipid syndrome. She is now on Coumadin. This is apparently the best for this syndrome. She sees Dr. Jeanie Sewer. She sees him every 6 months or so.   She also has a history of polycystic ovarian syndrome.  She is childless by choice and her husband has a vasectomy.  However, with the addition of the phospholipid syndrome, she is glad she  decided to be childless since this is associated with recurrent miscarriages.  Otherwise, there is no history of other atopic diseases, including drug allergies, stinging insect allergies, or contact dermatitis. There is no significant infectious history. Vaccinations are up to date.    Past Medical History: Patient Active Problem List   Diagnosis Date Noted   Class 2 severe obesity with serious comorbidity and body mass index (BMI) of 36.0 to 36.9 in adult [facility]) 08/14/2022   Long term (current) use of anticoagulants [Z79.01] 05/15/2022   Antiphospholipid antibody positive 04/11/2022   Cerebrovascular accident (CVA) (HCC) 03/15/2022   Abnormal brain MRI 02/13/2022   Persistent depressive disorder 01/30/2022   Left-sided headache 01/30/2022   Vitamin D deficiency 03/02/2020   History of PCOS 03/01/2020   Anxiety and depression 03/01/2020   Attention and concentration deficit 03/01/2020    Medication List:  Allergies as of 09/17/2022   No Known Allergies      Medication List        Accurate as of September 17, 2022  1:21 PM. If you have any questions, ask your nurse or doctor.          ALPRAZolam 0.5 MG tablet Commonly known as: XANAX Take 1/2 to 1 tab po BID prn for acute panic.   aspirin 81 MG chewable tablet Chew 81 mg by mouth daily.   atorvastatin 40 MG tablet Commonly known as: LIPITOR TAKE 1 TABLET BY MOUTH EVERY DAY   busPIRone 5 MG tablet Commonly known as: BUSPAR Take 5 mg by mouth 2 (two) times daily.   calcium carbonate 500 MG chewable tablet Commonly known as: TUMS - dosed in mg elemental calcium Chew 2 tablets by mouth daily as needed for indigestion or heartburn.   DHEA PO Take 5 mg by mouth daily.   famotidine 40 MG tablet Commonly known as: Pepcid Take 1 tablet (40 mg total) by mouth daily. Started by: Alfonse Spruce   folic acid 1 MG tablet Commonly known as: FOLVITE Take 1 mg by mouth daily.   metFORMIN 750 MG 24 hr  tablet Commonly known as: GLUCOPHAGE-XR Take 750 mg by mouth 2 (two) times daily.   OMEGA-3-ACID ETHYL ESTERS PO Take 650 mg by mouth daily.   ondansetron 4 MG tablet Commonly known as: ZOFRAN TAKE 1 TABLET BY MOUTH EVERY 8 HOURS AS NEEDED FOR NAUSEA AND VOMITING   QC Tumeric Complex 500 MG Caps Generic drug: Turmeric Take 500 mg by mouth daily.   spironolactone 50 MG tablet Commonly known as: ALDACTONE Take 1 tablet (50 mg total) by mouth daily.   TRINTELLIX PO Take by mouth.   warfarin 5 MG tablet Commonly known as: COUMADIN Take as directed by the anticoagulation clinic. If you are unsure how to take this medication, talk to your nurse or doctor. Original instructions: TAKE 3  TABLETS BY MOUTH DAILY EXCEPT TAKE 2 TABLETS ON MONDAYS AND FRIDAYS OR AS DIRECTED BY ANTICOAGULATION CLINIC   Wegovy 0.25 MG/0.5ML Soaj Generic drug: Semaglutide-Weight Management  Inject 0.25 mg into the skin once a week.        Birth History: non-contributory  Developmental History: non-contributory  Past Surgical History: Past Surgical History:  Procedure Laterality Date   BUBBLE STUDY  03/22/2022   Procedure: BUBBLE STUDY;  Surgeon: Lewayne Bunting, MD;  Location: [facility] ENDOSCOPY;  Service: Cardiovascular;;   CYSTOSCOPY WITH RETROGRADE PYELOGRAM, URETEROSCOPY AND STENT PLACEMENT Right 01/05/2019   Procedure: CYSTOSCOPY/URETEROSCOP/STENT PLACEMENT;  Surgeon: Noel Christmas, MD;  Location: [facility];  Service: Urology;  Laterality: Right;  75 MINS   CYSTOSCOPY WITH RETROGRADE PYELOGRAM, URETEROSCOPY AND STENT PLACEMENT Right 02/09/2019   Procedure: CYSTOSCOPY WITH RETROGRADE PYELOGRAM, URETEROSCOPY AND STENT PLACEMENT;  Surgeon: Noel Christmas, MD;  Location: [facility];  Service: Urology;  Laterality: Right;  1 HR   ESOPHAGOGASTRODUODENOSCOPY  2015   HOLMIUM LASER APPLICATION Right 02/09/2019   Procedure: HOLMIUM LASER APPLICATION;  Surgeon: Noel Christmas, MD;  Location: [facility];  Service: Urology;  Laterality: Right;   TEE WITHOUT CARDIOVERSION N/A 03/22/2022   Procedure: TRANSESOPHAGEAL ECHOCARDIOGRAM (TEE);  Surgeon: Lewayne Bunting, MD;  Location: [facility] ENDOSCOPY;  Service: Cardiovascular;  Laterality: N/A;   TONSILLECTOMY  2014   WISDOM TOOTH EXTRACTION  2018     Family History: Family History  Problem Relation Age of Onset   Fibromyalgia Mother    Headache Mother    Neuropathy Father    Diabetes Father    Heart failure Father    Diabetes Brother    Breast cancer Maternal Grandmother    Colon cancer Paternal Grandfather    Liver disease Neg Hx    Esophageal cancer Neg Hx      Social History: Sheva lives at home with her husband. She is a Engineer, agricultural for apartment communities. She works for [employer]. She has been working from home since [employer]. She does websites and branding.  Her husband works on [employer] side of the same organization.  They live in a house that is 38 years old.  There is wood in the main living areas and carpeting in the bedroom.  There is gas heating and central cooling.  There are 2 cats and 1 dog in the home.  There are no dust mite covers on the bedding.  There is no tobacco exposure.  There is no fume, chemical, or dust exposure.  There is a HEPA filter in the home.  She does not live near an interstate or industrial area.   Review of systems otherwise negative other than that mentioned in the HPI.    Objective:   Blood pressure 132/86, pulse 89, temperature (!) 97.3 F (36.3 C), resp. rate 18, height 5\' 6"  (1.676 m), weight 233 lb 8 oz (105.9 kg), SpO2 96%. Body mass index is 37.69 kg/m.     Physical Exam Vitals reviewed.  Constitutional:      Appearance: She is well-developed.  HENT:     Head: Normocephalic and atraumatic.     Right Ear: Tympanic membrane, ear canal and external ear normal. No drainage, swelling or tenderness. Tympanic membrane is  not injected, scarred, erythematous, retracted or bulging.     Left Ear: Tympanic membrane, ear canal and external ear normal. No drainage, swelling or tenderness. Tympanic membrane is not injected, scarred, erythematous, retracted or bulging.     Nose: Mucosal edema and rhinorrhea present. No nasal deformity or septal deviation.     Right Turbinates: Enlarged, swollen and pale.  Left Turbinates: Enlarged, swollen and pale.     Right Sinus: No maxillary sinus tenderness or frontal sinus tenderness.     Left Sinus: No maxillary sinus tenderness or frontal sinus tenderness.     Mouth/Throat:     Lips: Pink.     Mouth: Mucous membranes are moist. Mucous membranes are not pale and not dry.     Pharynx: Uvula midline.     Comments: Cobblestoning in the posterior oropharynx.  Eyes:     General: Lids are normal. Allergic shiner present.        Right eye: No discharge.        Left eye: No discharge.     Conjunctiva/sclera: Conjunctivae normal.     Right eye: Right conjunctiva is not injected. No chemosis.    Left eye: Left conjunctiva is not injected. No chemosis.    Pupils: Pupils are equal, round, and reactive to light.  Cardiovascular:     Rate and Rhythm: Normal rate and regular rhythm.     Heart sounds: Normal heart sounds.  Pulmonary:     Effort: Pulmonary effort is normal. No tachypnea, accessory muscle usage or respiratory distress.     Breath sounds: Normal breath sounds. No wheezing, rhonchi or rales.  Chest:     Chest wall: No tenderness.  Abdominal:     Tenderness: There is no abdominal tenderness. There is no guarding or rebound.  Lymphadenopathy:     Head:     Right side of head: No submandibular, tonsillar or occipital adenopathy.     Left side of head: No submandibular, tonsillar or occipital adenopathy.     Cervical: No cervical adenopathy.  Skin:    Coloration: Skin is not pale.     Findings: No abrasion, erythema, petechiae or rash. Rash is not papular, urticarial  or vesicular.  Neurological:     Mental Status: She is alert.  Psychiatric:        Behavior: Behavior is cooperative.      Diagnostic studies: deferred due to insurance stipulations that refuse to pay for testing at initial visits, making it more difficult for patients to get the care they need         Malachi Bonds, MD [facility]

## 2022-09-17 NOTE — Telephone Encounter (Signed)
Kristen Hoover, Please contact this patient and have her scheduled with Dr. Chales Abrahams instead of Dr. Adela Lank as Dr. Chales Abrahams is the provider who signed off on her appt with [facility].  [name], Please obtain Coumadin clearance, cardiac clearance, and neurology clearance for this patient. Please see [name]'s OV notation for specifics. The patient's PV date is 09/20/2022 in rm 52.  She is currently scheduled with Dr. Adela Lank by mistake, she should have been scheduled with Dr. Chales Abrahams.  I am sending this note to you and to the scheduler so you will be notified when the patient has been scheduled with Dr. Chales Abrahams.   Thank you [name]

## 2022-09-17 NOTE — Telephone Encounter (Signed)
Pepcid is not covered but tagament is advise to change

## 2022-09-18 NOTE — Telephone Encounter (Signed)
Left message for pt to call back  °

## 2022-09-20 ENCOUNTER — Telehealth: Payer: Self-pay | Admitting: *Deleted

## 2022-09-20 ENCOUNTER — Telehealth: Payer: Self-pay

## 2022-09-20 ENCOUNTER — Ambulatory Visit: Payer: 59 | Admitting: *Deleted

## 2022-09-20 VITALS — Ht 66.5 in | Wt 230.0 lb

## 2022-09-20 DIAGNOSIS — K625 Hemorrhage of anus and rectum: Secondary | ICD-10-CM

## 2022-09-20 MED ORDER — NA SULFATE-K SULFATE-MG SULF 17.5-3.13-1.6 GM/177ML PO SOLN
1.0000 | Freq: Once | ORAL | 0 refills | Status: AC
Start: 2022-09-20 — End: 2022-09-20

## 2022-09-20 NOTE — Telephone Encounter (Signed)
Spoke with Chip Boer, RN via secure chat. Also, refer to phone note 09/17/22.

## 2022-09-20 NOTE — Progress Notes (Signed)
Pt's name and DOB verified at the beginning of the pre-visit.  Pt denies any difficulty with ambulating,sitting, laying down or rolling side to side Gave both [facility] main # and MD on call # prior to instructions.  No egg or soy allergy known to patient  No issues known to pt with past sedation with any surgeries or procedures Pt denies having issues being intubated Pt has no issues moving head neck or swallowing No FH of Malignant Hyperthermia Pt is not on diet pills Pt is not on home 02  Pt is not on blood thinners  Pt denies issues with constipation  Pt is not on dialysis Pt denise any abnormal heart rhythms  Pt denies any upcoming cardiac testing Pt encouraged to use to use Singlecare or Goodrx to reduce cost  Patient's chart reviewed by Kristen Hoover CNRA prior to pre-visit and patient appropriate for the [facility].  Pre-visit completed and red dot placed by patient's name on their procedure day (on provider's schedule).  . Visit by phone Pt states weight is 230 lb  Instructed pt why it is important to and  to call if they have any changes in health or new medications. Directed them to the # given and on instructions.   Pt states they will.  Instructions reviewed with pt and pt states understanding. Instructed to review again prior to procedure. Pt states they will.  Instructions sent by mail with coupon and by my chart  Pt states she has already reached out to her PCP for coumadin clearance.. Pt states that is is fully aware that she is to follow the  instructions on her blood thinner given by prescribing MD. Pt states she will call # given (main office # ) and leace message of when to stop her blood thinner as soon as she gets instructions Neuro clearance was given in most recent OV. Pt will be starting [facility] and RN instructed her that she is to not take for 7 days prior to procedure and that if she does take it her procedure will be canceled. Reviewed  Oral diabetic med as well and pt states she  understood all instructions given for specific meds.

## 2022-09-20 NOTE — Telephone Encounter (Signed)
PRIMARY CARD DR. NAHSER. Left message to call back to set up tele pre op appt. Procedure date not until 11/22/22.

## 2022-09-20 NOTE — Telephone Encounter (Signed)
[facility] Pre-operative Risk Assessment     Request for surgical clearance:     Endoscopy Procedure  What type of surgery is being performed?     Colon  When is this surgery scheduled?     11/22/22  What type of clearance is required ?   Pharmacy & Cardiac   Are there any medications that need to be held prior to surgery and how long? Coumadin  Practice name and name of physician performing surgery?      [facility]; Dr. Chales Abrahams  What is your office phone and fax number?      Phone- [phone]  Fax- [phone]  Anesthesia type (None, local, MAC, general) ?       MAC

## 2022-09-20 NOTE — Telephone Encounter (Signed)
Pt takes warfarin for history of stroke and antiphospholipid syndrome. We are not managing pt's warfarin, recommend clearance come from managing provider.

## 2022-09-20 NOTE — Telephone Encounter (Signed)
Spoke with patient & rescheduled procedure with Dr. Chales Abrahams to 11/22/22 at 8:00 am in the [facility]. Patient has already reached out to PCP for coumadin clearance & I will also send PCP a letter as well in a separate phone encounter. Pt has PV scheduled today.

## 2022-09-20 NOTE — Telephone Encounter (Signed)
Name: Kristen Hoover  DOB: 1985/12/31  MRN: [id number]  Primary Cardiologist: None   Preoperative team, please contact this patient and set up a phone call appointment for further preoperative risk assessment. Please obtain consent and complete medication review. Thank you for your help.  I confirm that guidance regarding antiplatelet and oral anticoagulation therapy has been completed and, if necessary, noted below.  Pt takes warfarin for history of stroke and antiphospholipid syndrome. We are not managing pt's warfarin, recommend clearance come from managing provider.       Joylene Grapes, NP 09/20/2022, 12:32 PM  [facility]

## 2022-09-23 ENCOUNTER — Telehealth: Payer: Self-pay | Admitting: *Deleted

## 2022-09-23 ENCOUNTER — Encounter: Payer: Self-pay | Admitting: *Deleted

## 2022-09-23 NOTE — Progress Notes (Signed)
error 

## 2022-09-23 NOTE — Telephone Encounter (Signed)
  Patient Consent for Virtual Visit   Patient scheduled 10-07-22       Kristen Hoover has provided verbal consent on 09/23/2022 for a virtual visit (video or telephone).   CONSENT FOR VIRTUAL VISIT FOR:  Kristen Hoover  By participating in this virtual visit I agree to the following:  I hereby voluntarily request, consent and authorize [facility] and its employed or contracted physicians, physician assistants, nurse practitioners or other licensed health care professionals (the Practitioner), to provide me with telemedicine health care services (the "Services") as deemed necessary by the treating Practitioner. I acknowledge and consent to receive the Services by the Practitioner via telemedicine. I understand that the telemedicine visit will involve communicating with the Practitioner through live audiovisual communication technology and the disclosure of certain medical information by electronic transmission. I acknowledge that I have been given the opportunity to request an in-person assessment or other available alternative prior to the telemedicine visit and am voluntarily participating in the telemedicine visit.  I understand that I have the right to withhold or withdraw my consent to the use of telemedicine in the course of my care at any time, without affecting my right to future care or treatment, and that the Practitioner or I may terminate the telemedicine visit at any time. I understand that I have the right to inspect all information obtained and/or recorded in the course of the telemedicine visit and may receive copies of available information for a reasonable fee.  I understand that some of the potential risks of receiving the Services via telemedicine include:  Delay or interruption in medical evaluation due to technological equipment failure or disruption; Information transmitted may not be sufficient (e.g. poor resolution of images) to allow for appropriate  medical decision making by the Practitioner; and/or  In rare instances, security protocols could fail, causing a breach of personal health information.  Furthermore, I acknowledge that it is my responsibility to provide information about my medical history, conditions and care that is complete and accurate to the best of my ability. I acknowledge that Practitioner's advice, recommendations, and/or decision may be based on factors not within their control, such as incomplete or inaccurate data provided by me or distortions of diagnostic images or specimens that may result from electronic transmissions. I understand that the practice of medicine is not an exact science and that Practitioner makes no warranties or guarantees regarding treatment outcomes. I acknowledge that a copy of this consent can be made available to me via my patient portal [facility] MyChart), or I can request a printed copy by calling the office of [facility].    I understand that my insurance will be billed for this visit.   I have read or had this consent read to me. I understand the contents of this consent, which adequately explains the benefits and risks of the Services being provided via telemedicine.  I have been provided ample opportunity to ask questions regarding this consent and the Services and have had my questions answered to my satisfaction. I give my informed consent for the services to be provided through the use of telemedicine in my medical care

## 2022-09-24 ENCOUNTER — Encounter: Payer: Self-pay | Admitting: Allergy & Immunology

## 2022-09-24 ENCOUNTER — Other Ambulatory Visit: Payer: Self-pay

## 2022-09-24 ENCOUNTER — Ambulatory Visit: Payer: 59 | Admitting: Allergy & Immunology

## 2022-09-24 VITALS — BP 110/72 | HR 98 | Temp 97.3°F | Ht 66.5 in | Wt 233.9 lb

## 2022-09-24 DIAGNOSIS — J3089 Other allergic rhinitis: Secondary | ICD-10-CM | POA: Diagnosis not present

## 2022-09-24 DIAGNOSIS — R112 Nausea with vomiting, unspecified: Secondary | ICD-10-CM

## 2022-09-24 DIAGNOSIS — K219 Gastro-esophageal reflux disease without esophagitis: Secondary | ICD-10-CM

## 2022-09-24 MED ORDER — RYALTRIS 665-25 MCG/ACT NA SUSP: 2.0000 | Freq: Two times a day (BID) | NASAL | 5 refills | Status: DC

## 2022-09-24 MED ORDER — LEVOCETIRIZINE DIHYDROCHLORIDE 5 MG PO TABS: 5.0000 mg | ORAL_TABLET | Freq: Two times a day (BID) | ORAL | 1 refills | Status: DC

## 2022-09-24 NOTE — Patient Instructions (Addendum)
1. Chronic rhinitis - Testing today showed: indoor molds and dust mites - Copy of test results provided. - Avoidance measures provided. - Stop taking: your current antihistamines - Continue with:  - Start taking: Xyzal (levocetirizine) 5mg  one to two tablets once daily and Ryaltris (olopatadine/mometasone) two sprays per nostril 1-2 times daily as needed - You can use an extra dose of the antihistamine, if needed, for breakthrough symptoms.  - Consider nasal saline rinses 1-2 times daily to remove allergens from the nasal cavities as well as help with mucous clearance (this is especially helpful to do before the nasal sprays are given) - Consider allergy shots as a means of long-term control. - Allergy shots "re-train" and "reset" the immune system to ignore environmental allergens and decrease the resulting immune response to those allergens (sneezing, itchy watery eyes, runny nose, nasal congestion, etc).    - Allergy shots improve symptoms in 75-85% of patients.  - We can discuss more at the next appointment if the medications are not working for you.  2. GERD  - Start the Pepcid that we sent in to see if this helps with the symptoms.  3. Return in about 3 months (around 12/24/2022).    Please inform us of any Emergency Department visits, hospitalizations, or changes in symptoms. Call us before going to the ED for breathing or allergy symptoms since we might be able to fit you in for a sick visit. Feel free to contact us anytime with any questions, problems, or concerns.  It was a pleasure to meet you today!  Websites that have reliable patient information: 1. American Academy of Asthma, Allergy, and Immunology: www.aaaai.org 2. Food Allergy Research and Education (FARE): foodallergy.org 3. Mothers of Asthmatics: http://www.asthmacommunitynetwork.org 4. American College of Allergy, Asthma, and Immunology: www.acaai.org   COVID-19 Vaccine Information can be found at:  [email or web address] For questions related to vaccine distribution or appointments, please email [email or web address] or call [phone].     "Like" [place] on Facebook and Instagram for our latest updates!      A healthy democracy works best when [employer] participate! Make sure you are registered to vote! If you have moved or changed any of your contact information, you will need to get this updated before voting! Scan the QR codes below to learn more!          Control of Dust Mite Allergen    Dust mites play a major role in allergic asthma and rhinitis.  They occur in environments with high humidity wherever human skin is found.  Dust mites absorb humidity from the atmosphere (ie, they do not drink) and feed on organic matter (including shed human and animal skin).  Dust mites are a microscopic type of insect that you cannot see with the naked eye.  High levels of dust mites have been detected from mattresses, pillows, carpets, upholstered furniture, bed covers, clothes, soft toys and any woven material.  The principal allergen of the dust mite is found in its feces.  A gram of dust may contain 1,000 mites and 250,000 fecal particles.  Mite antigen is easily measured in the air during house cleaning activities.  Dust mites do not bite and do not cause harm to humans, other than by triggering allergies/asthma.    Ways to decrease your exposure to dust mites in your home:  Encase mattresses, box springs and pillows with a mite-impermeable barrier or cover   Wash sheets, blankets and drapes weekly in hot water (130 F) with detergent and  dry them in a dryer on the hot setting.  Have the room cleaned frequently with a vacuum cleaner and a damp dust-mop.  For carpeting or rugs, vacuuming with a vacuum cleaner equipped with a high-efficiency particulate air (HEPA) filter.  The dust mite allergic individual should not be in a room which  is being cleaned and should wait 1 hour after cleaning before going into the room. Do not sleep on upholstered furniture (eg, couches).   If possible removing carpeting, upholstered furniture and drapery from the home is ideal.  Horizontal blinds should be eliminated in the rooms where the person spends the most time (bedroom, study, television room).  Washable vinyl, roller-type shades are optimal. Remove all non-washable stuffed toys from the bedroom.  Wash stuffed toys weekly like sheets and blankets above.   Reduce indoor humidity to less than 50%.  Inexpensive humidity monitors can be purchased at most hardware stores.  Do not use a humidifier as can make the problem worse and are not recommended.  Control of Mold Allergen   Mold and fungi can grow on a variety of surfaces provided certain temperature and moisture conditions exist.  Outdoor molds grow on plants, decaying vegetation and soil.  The major outdoor mold, Alternaria and Cladosporium, are found in very high numbers during hot and dry conditions.  Generally, a late Summer - Fall peak is seen for common outdoor fungal spores.  Rain will temporarily lower outdoor mold spore count, but counts rise rapidly when the rainy period ends.  The most important indoor molds are Aspergillus and Penicillium.  Dark, humid and poorly ventilated basements are ideal sites for mold growth.  The next most common sites of mold growth are the bathroom and the kitchen.   Indoor (Perennial) Mold Control   Positive indoor molds via skin testing: Aspergillus, Penicillium, Fusarium, Aureobasidium (Pullulara), and Rhizopus  Maintain humidity below 50%. Clean washable surfaces with 5% bleach solution. Remove sources e.g. contaminated carpets.

## 2022-09-24 NOTE — Progress Notes (Signed)
FOLLOW UP  Date of Service/Encounter:  09/24/22   Assessment:   Perennial allergic rhinitis (dust mites, indoor molds)  Nausea and vomiting - from postnasal drip  Gastroesophageal reflux disease - trying famotidine to see if this helps  Plan/Recommendations:   1. Chronic rhinitis - Testing today showed: indoor molds and dust mites - Copy of test results provided. - Avoidance measures provided. - Stop taking: your current antihistamines - Continue with:  - Start taking: Xyzal (levocetirizine) 5mg  one to two tablets once daily and Ryaltris (olopatadine/mometasone) two sprays per nostril 1-2 times daily as needed - You can use an extra dose of the antihistamine, if needed, for breakthrough symptoms.  - Consider nasal saline rinses 1-2 times daily to remove allergens from the nasal cavities as well as help with mucous clearance (this is especially helpful to do before the nasal sprays are given) - Consider allergy shots as a means of long-term control. - Allergy shots "re-train" and "reset" the immune system to ignore environmental allergens and decrease the resulting immune response to those allergens (sneezing, itchy watery eyes, runny nose, nasal congestion, etc).    - Allergy shots improve symptoms in 75-85% of patients.  - We can discuss more at the next appointment if the medications are not working for you.  2. GERD  - Start the Pepcid that we sent in to see if this helps with the symptoms.  3. Return in about 3 months (around 12/24/2022).    Subjective:   Kristen Hoover is a 37 y.o. female presenting today for follow up of  Chief Complaint  Patient presents with   Allergy Testing    Kristen Hoover has a history of the following: Patient Active Problem List   Diagnosis Date Noted   Class 2 severe obesity with serious comorbidity and body mass index (BMI) of 36.0 to 36.9 in adult [facility]) 08/14/2022   Long term (current) use of anticoagulants [Z79.01]  05/15/2022   Antiphospholipid antibody positive 04/11/2022   Cerebrovascular accident (CVA) (HCC) 03/15/2022   Abnormal brain MRI 02/13/2022   Persistent depressive disorder 01/30/2022   Left-sided headache 01/30/2022   Vitamin D deficiency 03/02/2020   History of PCOS 03/01/2020   Anxiety and depression 03/01/2020   Attention and concentration deficit 03/01/2020    History obtained from: chart review and patient.  Kristen Hoover is a 37 y.o. female presenting for skin testing.  She was last seen about a week ago for evaluation of postnasal drip which causes emesis in the mornings.  She also reported a wet cough that we felt might be related to uncontrolled reflux.  She has never been on Pepcid, but is open to trying it.  Since the last visit, she has done well.  She has been off of her antihistamines for the testing today.  She did not start the Pepcid that was prescribed at the last visit, but she is going to do that.  Otherwise, there have been no changes to her past medical history, surgical history, family history, or social history.    Review of systems otherwise negative other than that mentioned in the HPI.    Objective:   Blood pressure 110/72, pulse 98, temperature (!) 97.3 F (36.3 C), height 5' 6.5" (1.689 m), weight 233 lb 14.4 oz (106.1 kg), SpO2 97%. Body mass index is 37.19 kg/m.    Physical exam deferred since this was a skin testing appointment only.  Diagnostic studies:     Allergy Studies:  Airborne Adult Perc - 09/24/22 1533     Time Antigen Placed 1515    Allergen Manufacturer Waynette Buttery    Location Back    Number of Test 55    Panel 1 Select    1. Control-Buffer 50% Glycerol Negative    2. Control-Histamine 2+    3. Bahia Negative    4. French Southern Territories Negative    5. Johnson Negative    6. Kentucky Blue Negative    7. Meadow Fescue Negative    8. Perennial Rye Negative    9. Timothy Negative    10. Ragweed Mix Negative    11. Cocklebur Negative     12. Plantain,  English Negative    13. Baccharis Negative    14. Dog Fennel Negative    15. Russian Thistle Negative    16. Lamb's Quarters Negative    17. Sheep Sorrell Negative    18. Rough Pigweed Negative    19. Marsh Elder, Rough Negative    20. Mugwort, Common Negative    21. Box, Elder Negative    22. Cedar, red Negative    23. Sweet Gum Negative    24. Pecan Pollen Negative    25. Pine Mix Negative    26. Walnut, Black Pollen Negative    27. Red Mulberry Negative    28. Ash Mix Negative    29. Birch Mix Negative    30. Beech American Negative    31. Cottonwood, Guinea-Bissau Negative    32. Hickory, White Negative    33. Maple Mix Negative    34. Oak, Guinea-[place]iss[place] Mix Negative    35. Sycamore Eastern Negative    36. Alternaria Alternata Negative    37. Cladosporium Herbarum Negative    38. Aspergillus Mix Negative    39. Penicillium Mix Negative    40. Bipolaris Sorokiniana (Helminthosporium) Negative    41. Drechslera Spicifera (Curvularia) Negative    42. Mucor Plumbeus Negative    43. Fusarium Moniliforme Negative    44. Aureobasidium Pullulans (pullulara) Negative    45. Rhizopus Oryzae Negative    46. Botrytis Cinera Negative    47. Epicoccum Nigrum Negative    48. Phoma Betae Negative    49. Dust Mite Mix Negative    50. Cat Hair 10,000 BAU/ml Negative    51.  Dog Epithelia Negative    52. Mixed Feathers Negative    53. Horse Epithelia Negative    54. Cockroach, German Negative    55. Tobacco Leaf Negative             Intradermal - 09/24/22 1535     Time Antigen Placed 1545    Allergen Manufacturer Waynette Buttery    Location Arm    Number of Test 16    Intradermal Select    Control Negative    Bahia Negative    French Southern Territories Negative    Johnson Negative    7 Grass Negative    Ragweed Mix Negative    Weed Mix Negative    Tree Mix Negative    Mold 1 Negative    Mold 2 3+    Mold 3 Negative    Mold 4 3+    Mite Mix 4+    Cat Negative    Dog Negative     Cockroach Negative             Food Adult Perc - 09/24/22 1500     Time Antigen Placed 1515    Allergen Manufacturer Waynette Buttery  Location Back    Number of allergen test 17     Control-buffer 50% Glycerol Negative    Control-Histamine 2+    1. Peanut Negative    2. Soybean Negative    3. Wheat Negative    4. Sesame Negative    5. Milk, Cow Negative    6. Casein Negative    7. Egg White, Chicken Negative    8. Shellfish Mix Negative    9. Fish Mix Negative    10. Cashew Negative    11. Walnut Food Negative    12. Almond Negative    13. Hazelnut Negative    14. Pecan Food Negative    15. Pistachio Negative    16. [place] Nut Negative    17. Coconut Negative             Allergy testing results were read and interpreted by myself, documented by clinical staff.      Malachi Bonds, MD  [facility]

## 2022-09-30 ENCOUNTER — Ambulatory Visit: Payer: 59

## 2022-10-07 ENCOUNTER — Ambulatory Visit: Payer: 59 | Attending: Nurse Practitioner

## 2022-10-07 ENCOUNTER — Ambulatory Visit (INDEPENDENT_AMBULATORY_CARE_PROVIDER_SITE_OTHER): Payer: 59

## 2022-10-07 DIAGNOSIS — Z01810 Encounter for preprocedural cardiovascular examination: Secondary | ICD-10-CM

## 2022-10-07 DIAGNOSIS — Z7901 Long term (current) use of anticoagulants: Secondary | ICD-10-CM

## 2022-10-07 LAB — POCT INR: INR: 4.7 — AB (ref 2.0–3.0)

## 2022-10-07 NOTE — Patient Instructions (Addendum)
Pre visit review using our clinic review tool, if applicable. No additional management support is needed unless otherwise documented below in the visit note.  Hold dose today and reduce dose tomorrow to take 1 tablet and then change weekly dose to take 2 tablets daily except take 3 tablets on Tuesday and Saturday. Recheck in 1 week.

## 2022-10-07 NOTE — Progress Notes (Signed)
Has been prescribed cimetidine and levocetirizine for dust mite allergy. Pt has not started either yet but will start them tomorrow. Cimetidine does interact with warfarin and increase bleeding risk. Pt also reports increase in mental stress, this can also increase INR. Pt reports eating less protein, which will effect warfarin processing and increase INR.  Pt denies any s/s of bleeding. Advised if any s/s to go to the ER. Pt verbalized understanding.

## 2022-10-07 NOTE — Progress Notes (Signed)
Virtual Visit via Telephone Note   Because of Kristen Hoover's co-morbid illnesses, she is at least at moderate risk for complications without adequate follow up.  This format is felt to be most appropriate for this patient at this time.  The patient did not have access to video technology/had technical difficulties with video requiring transitioning to audio format only (telephone).  All issues noted in this document were discussed and addressed.  No physical exam could be performed with this format.  Please refer to the patient's chart for her consent to telehealth for [facility].  Evaluation Performed:  Preoperative cardiovascular risk assessment _____________   Date:  10/07/2022   Patient ID:  Kristen Hoover, DOB 1985-06-01, MRN [id number] Patient Location:  Home Provider location:   Office  Primary Care Provider:  Allwardt, Crist Infante, PA-C Primary Cardiologist:  None  Chief Complaint / Patient Profile   37 y.o. y/o female with a h/o ASD/PFO, CVA, anxiety, antiphospholipid antibodies, migraines, GERD who is pending colonoscopy and presents today for telephonic preoperative cardiovascular risk assessment.  History of Present Illness    Kristen Hoover is a 37 y.o. female who presents via audio/video conferencing for a telehealth visit today.  Pt was last seen in cardiology clinic on 06/05/2022 by Dr. Elease Hashimoto.  At that time Kristen Hoover was doing well and encouraged to increase physical activity.  The patient is now pending procedure as outlined above. Since her last visit, she has been doing well and is still working on increasing her physical activity.  She is planning to start [facility] for weight loss in the near future.  She denies chest pain, shortness of breath, lower extremity edema, fatigue, palpitations, melena, hematuria, hemoptysis, diaphoresis, weakness, presyncope, syncope, orthopnea, and PND.     Past Medical History    Past  Medical History:  Diagnosis Date   Anxiety and depression    Clotting disorder (HCC)    Frequency of urination    GERD (gastroesophageal reflux disease)    Heart murmur    History of blood clots    History of pancreatitis    12-28-2018-  last episode 2006   Hypertension    PCOS (polycystic ovarian syndrome)    Renal calculus, right    Stroke [facility])    Urgency of urination    Wears glasses    Past Surgical History:  Procedure Laterality Date   BUBBLE STUDY  03/22/2022   Procedure: BUBBLE STUDY;  Surgeon: Lewayne Bunting, MD;  Location: [facility] ENDOSCOPY;  Service: Cardiovascular;;   CYSTOSCOPY WITH RETROGRADE PYELOGRAM, URETEROSCOPY AND STENT PLACEMENT Right 01/05/2019   Procedure: CYSTOSCOPY/URETEROSCOP/STENT PLACEMENT;  Surgeon: Noel Christmas, MD;  Location: [facility];  Service: Urology;  Laterality: Right;  38 MINS   CYSTOSCOPY WITH RETROGRADE PYELOGRAM, URETEROSCOPY AND STENT PLACEMENT Right 02/09/2019   Procedure: CYSTOSCOPY WITH RETROGRADE PYELOGRAM, URETEROSCOPY AND STENT PLACEMENT;  Surgeon: Noel Christmas, MD;  Location: [facility];  Service: Urology;  Laterality: Right;  1 HR   ESOPHAGOGASTRODUODENOSCOPY  2015   HOLMIUM LASER APPLICATION Right 02/09/2019   Procedure: HOLMIUM LASER APPLICATION;  Surgeon: Noel Christmas, MD;  Location: [facility];  Service: Urology;  Laterality: Right;   TEE WITHOUT CARDIOVERSION N/A 03/22/2022   Procedure: TRANSESOPHAGEAL ECHOCARDIOGRAM (TEE);  Surgeon: Lewayne Bunting, MD;  Location: [facility] ENDOSCOPY;  Service: Cardiovascular;  Laterality: N/A;   TONSILLECTOMY  2014   WISDOM TOOTH EXTRACTION  2018    Allergies  No Known Allergies  Home Medications    Prior to Admission medications   Medication Sig Start Date End Date Taking? Authorizing Provider  ALPRAZolam Prudy Feeler) 0.5 MG tablet Take 1/2 to 1 tab po BID prn for acute panic. 05/13/22   Allwardt, Crist Infante, PA-C  aspirin 81 MG chewable  tablet Chew 81 mg by mouth daily.    [provider]  atorvastatin (LIPITOR) 40 MG tablet TAKE 1 TABLET BY MOUTH EVERY DAY 05/13/22   Everlena Cooper, Adam R, DO  busPIRone (BUSPAR) 5 MG tablet Take 5 mg by mouth 2 (two) times daily. 08/12/22   [provider]  calcium carbonate (TUMS - DOSED IN MG ELEMENTAL CALCIUM) 500 MG chewable tablet Chew 2 tablets by mouth daily as needed for indigestion or heartburn.    [provider]  cimetidine (TAGAMET) 800 MG tablet Take 1 tablet (800 mg total) by mouth 3 (three) times daily. 09/25/22 10/25/22  Alfonse Spruce, MD  folic acid (FOLVITE) 1 MG tablet Take 1 mg by mouth daily.    [provider]  levocetirizine (XYZAL) 5 MG tablet Take 1 tablet (5 mg total) by mouth in the morning and at bedtime. 09/24/22   Alfonse Spruce, MD  metFORMIN (GLUCOPHAGE-XR) 750 MG 24 hr tablet Take 750 mg by mouth 2 (two) times daily. 01/05/20   [provider]  Olopatadine-Mometasone (Cristal Generous) [phone] MCG/ACT SUSP Place 2 sprays into the nose in the morning and at bedtime. 09/24/22   Alfonse Spruce, MD  OMEGA-3-ACID ETHYL ESTERS PO Take 650 mg by mouth daily.    [provider]  ondansetron (ZOFRAN) 4 MG tablet TAKE 1 TABLET BY MOUTH EVERY 8 HOURS AS NEEDED FOR NAUSEA AND VOMITING 09/13/22   Allwardt, Alyssa M, PA-C  OVER THE COUNTER MEDICATION Take 5 mg by mouth daily at 6 (six) AM. DHEA 5    [provider]  Semaglutide-Weight Management (WEGOVY) 0.25 MG/0.5ML SOAJ Inject 0.25 mg into the skin once a week. Patient taking differently: Inject 0.25 mg into the skin once a week. Will take first dose 9/19 or 9/20 2024 09/11/22   Allwardt, Alyssa M, PA-C  spironolactone (ALDACTONE) 50 MG tablet Take 1 tablet (50 mg total) by mouth daily. 04/11/22 08/14/22  Allwardt, Crist Infante, PA-C  Turmeric (QC TUMERIC COMPLEX) 500 MG CAPS Take 500 mg by mouth daily.    [provider]  Vortioxetine HBr (TRINTELLIX PO) Take by mouth.     [provider]  warfarin (COUMADIN) 5 MG tablet TAKE 3  TABLETS BY MOUTH DAILY EXCEPT TAKE 2 TABLETS ON MONDAYS AND FRIDAYS OR AS DIRECTED BY ANTICOAGULATION CLINIC 06/24/22   Allwardt, Crist Infante, PA-C    Physical Exam    Vital Signs:  Khianna Broadwell does not have vital signs available for review today.  Given telephonic nature of communication, physical exam is limited. AAOx3. NAD. Normal affect.  Speech and respirations are unlabored.  Accessory Clinical Findings    None  Assessment & Plan    1.  Preoperative Cardiovascular Risk Assessment: -Patient's RCRI score is 0.9%  The patient affirms she has been doing well without any new cardiac symptoms. They are able to achieve 7 METS without cardiac limitations. Therefore, based on ACC/AHA guidelines, the patient would be at acceptable risk for the planned procedure without further cardiovascular testing. The patient was advised that if she develops new symptoms prior to surgery to contact our office to arrange for a follow-up visit, and she verbalized understanding.  The patient was advised that if she develops new symptoms prior to surgery to contact our office to arrange for a follow-up visit, and she verbalized understanding.  Patient is on Coumadin but not managed by cardiology managed by PCP  A copy of this note will be routed to requesting surgeon.  Time:   Today, I have spent 6 minutes with the patient with telehealth technology discussing medical history, symptoms, and management plan.     Napoleon Form, Leodis Rains, NP  10/07/2022, 7:15 AM

## 2022-10-10 NOTE — Telephone Encounter (Signed)
[facility] Pre-operative Risk Assessment     Request for surgical clearance:     Endoscopy Procedure  What type of surgery is being performed?     Colon  When is this surgery scheduled?     11/22/22  What type of clearance is required ?   Pharmacy   Are there any medications that need to be held prior to surgery and how long? Coumadin  Practice name and name of physician performing surgery?      [facility]; Dr. Chales Abrahams  What is your office phone and fax number?      Phone- [phone]  Fax- [phone]  Anesthesia type (None, local, MAC, general) ?       MAC

## 2022-10-10 NOTE — Telephone Encounter (Signed)
Will create a Lovenox bridge for procedure on 11/15. Coumadin clinic has been aware of surgery scheduled but is waiting until closer to the surgery to create dosing instructions incase there is a change in the pt's warfarin dosing.   Pt was had a supratherapeutic INR last week. Changes in dosing were made. Pt will be back next week for another INR check.   Will create Lovenox bridge instructions after INR check next week.

## 2022-10-14 ENCOUNTER — Ambulatory Visit (INDEPENDENT_AMBULATORY_CARE_PROVIDER_SITE_OTHER): Payer: 59

## 2022-10-14 DIAGNOSIS — Z7901 Long term (current) use of anticoagulants: Secondary | ICD-10-CM

## 2022-10-14 LAB — POCT INR: INR: 1.7 — AB (ref 2.0–3.0)

## 2022-10-14 NOTE — Patient Instructions (Addendum)
Pre visit review using our clinic review tool, if applicable. No additional management support is needed unless otherwise documented below in the visit note.  Increase dose today to take 3 tablets and then continue 2 tablets daily except take 3 tablets on Tuesday and Saturday. Recheck in 2 week.

## 2022-10-14 NOTE — Progress Notes (Signed)
Pt started levocetirizine  but decided not to take cimetidine. No interaction with lovcetirizine. Pt also started [facility] a few days ago.  Pt has increased intake of protein.   Increase dose today to take 3 tablets and then continue 2 tablets daily except take 3 tablets on Tuesday and Saturday. Recheck in 2 week.

## 2022-10-15 ENCOUNTER — Encounter: Payer: 59 | Admitting: Gastroenterology

## 2022-10-18 NOTE — Telephone Encounter (Signed)
Procedure: Colonoscopy on 11/22/22  Actual Wt: 106.1 kg ( this is 176 % higher than ideal wt; if > 125% adjusted wt is used for calculating creatinine clearance) Ideal Wt: 61 kg  Adjusted Wt: 79 kg CrCl: 131/59 mL/min Lovenox dosing always uses actual wt and is 1.5 mg/kg if once daily and 1 mg/kg if BID  Pt does meet parameters for once daily Lovenox injections due to CrCl being > 30  Recommendation for Lovenox is 150 mg once daily  Current warfarin dosing; 10 mg daily except take 15 mg on Tuesdays and Saturdays  Lovenox bridge:  11/10: Take last dose of warfarin 11/11: NO warfarin, NO Lovenox 11/12: NO warfarin, Lovenox injection in AM 11/13: NO warfarin, Lovenox injection in AM 11/14: NO warfarin, Lovenox injection in AM (Before 7 AM)  11/15: SURGERY; NO WARFARIN, NO LOVENOX  11/16: Take 4 1/2 tablets (22.5 mg) warfarin, Lovenox injection in AM 11/17: Take 3 tablets (15 mg) warfarin, Lovenox injection in AM 11/18: Take 3 tablets (15 mg) warfarin, Lovenox injection in AM 11/19: Take 4 1/2 tablets (22.5 mg) warfarin, Lovenox injection in AM 11/20: Take 2 tablets (10 mg) warfarin, Lovenox injection in the AM 11/21: Take 2 tablets (10 mg) warfarin, Lovenox injection in AM 11/22: Recheck INR at [facility] [employer]; NO WARFARIN, NO LOVENOX UNTIL INR CHECK

## 2022-10-23 ENCOUNTER — Other Ambulatory Visit: Payer: Self-pay | Admitting: Allergy & Immunology

## 2022-10-23 NOTE — Telephone Encounter (Signed)
Is it ok to change from pepcid to tagament?

## 2022-10-24 NOTE — Telephone Encounter (Signed)
Left message for patient to call back  

## 2022-10-25 NOTE — Patient Instructions (Signed)
Pre visit review using our clinic review tool, if applicable. No additional management support is needed unless otherwise documented below in the visit note.  Increase dose today to take 2 1/2 tablets and the change weekly dose to take 2 tablets daily except take 3 tablets on Tuesday, Thursday and Saturday until starting Lovenox bridge instructions below. Recheck on 11/22 at [facility], [address]  11/10: Take last dose of warfarin 11/11: NO warfarin, NO Lovenox 11/12: NO warfarin, Lovenox injection in AM 11/13: NO warfarin, Lovenox injection in AM 11/14: NO warfarin, Lovenox injection in AM (Before 7 AM)   11/15: SURGERY; NO WARFARIN, NO LOVENOX   11/16: Take 4 1/2 tablets (22.5 mg) warfarin, Lovenox injection in AM 11/17: Take 3 tablets (15 mg) warfarin, Lovenox injection in AM 11/18: Take 3 tablets (15 mg) warfarin, Lovenox injection in AM 11/19: Take 4 1/2 tablets (22.5 mg) warfarin, Lovenox injection in AM 11/20: Take 2 tablets (10 mg) warfarin, Lovenox injection in the AM 11/21: Take 2 tablets (10 mg) warfarin, Lovenox injection in AM 11/22: Recheck INR at [facility]; NO WARFARIN, NO LOVENOX UNTIL INR CHECK

## 2022-10-25 NOTE — Telephone Encounter (Signed)
Noted. Pt may need further adjustments to Lovenox bridge if INR is out of range. Pt has already been advised of this and that we can hopefully provide final Lovenox bridge instructions on Monday, 10/21.

## 2022-10-25 NOTE — Telephone Encounter (Signed)
Spoke with patient & she had not yet received lovenox bridge instructions. She has upcoming appointment on Monday at the coumadin clinic & advised her to discuss further with clinic RN. Pt verbalized all understanding.

## 2022-10-25 NOTE — Telephone Encounter (Signed)
Left message for patient to call back  

## 2022-10-27 ENCOUNTER — Encounter: Payer: Self-pay | Admitting: Physician Assistant

## 2022-10-28 ENCOUNTER — Ambulatory Visit (INDEPENDENT_AMBULATORY_CARE_PROVIDER_SITE_OTHER): Payer: 59

## 2022-10-28 DIAGNOSIS — Z7901 Long term (current) use of anticoagulants: Secondary | ICD-10-CM | POA: Diagnosis not present

## 2022-10-28 LAB — POCT INR: INR: 1.9 — AB (ref 2.0–3.0)

## 2022-10-28 MED ORDER — ENOXAPARIN SODIUM 150 MG/ML IJ SOSY: 150.0000 mg | PREFILLED_SYRINGE | INTRAMUSCULAR | 0 refills | Status: DC

## 2022-10-28 NOTE — Telephone Encounter (Signed)
Please see patient message regarding dosage of Wegovy and advise

## 2022-10-28 NOTE — Progress Notes (Signed)
Pt  has colonoscopy scheduled for 11/15 and will be placed on a lovenox bridge.  Increase dose today to take 2 1/2 tablets and the change weekly dose to take 2 tablets daily except take 3 tablets on Tuesday, Thursday and Saturday until starting Lovenox bridge instructions below. Recheck on 11/22 at [facility], [address]   11/10: Take last dose of warfarin 11/11: NO warfarin, NO Lovenox 11/12: NO warfarin, Lovenox injection in AM 11/13: NO warfarin, Lovenox injection in AM 11/14: NO warfarin, Lovenox injection in AM (Before 7 AM)   11/15: SURGERY; NO WARFARIN, NO LOVENOX   11/16: Take 4 1/2 tablets (22.5 mg) warfarin, Lovenox injection in AM 11/17: Take 3 tablets (15 mg) warfarin, Lovenox injection in AM 11/18: Take 3 tablets (15 mg) warfarin, Lovenox injection in AM 11/19: Take 4 1/2 tablets (22.5 mg) warfarin, Lovenox injection in AM 11/20: Take 2 tablets (10 mg) warfarin, Lovenox injection in the AM 11/21: Take 2 tablets (10 mg) warfarin, Lovenox injection in AM 11/22: Recheck INR at [facility]; NO WARFARIN, NO LOVENOX UNTIL INR CHECK  Sent in Lovenox prescription to pt's pharmacy of choice.

## 2022-10-29 ENCOUNTER — Other Ambulatory Visit: Payer: Self-pay

## 2022-10-29 MED ORDER — WEGOVY 0.5 MG/0.5ML ~~LOC~~ SOAJ: 0.5000 mg | SUBCUTANEOUS | 0 refills | Status: DC

## 2022-11-05 ENCOUNTER — Encounter: Payer: Self-pay | Admitting: Neurology

## 2022-11-06 ENCOUNTER — Other Ambulatory Visit: Payer: Self-pay

## 2022-11-06 DIAGNOSIS — E66812 Obesity, class 2: Secondary | ICD-10-CM

## 2022-11-12 ENCOUNTER — Other Ambulatory Visit: Payer: Self-pay | Admitting: Neurology

## 2022-11-12 ENCOUNTER — Ambulatory Visit: Payer: 59 | Admitting: Physician Assistant

## 2022-11-14 ENCOUNTER — Encounter: Payer: Self-pay | Admitting: Gastroenterology

## 2022-11-18 ENCOUNTER — Other Ambulatory Visit: Payer: Self-pay

## 2022-11-18 ENCOUNTER — Encounter: Payer: Self-pay | Admitting: Neurology

## 2022-11-18 ENCOUNTER — Encounter: Payer: Self-pay | Admitting: Physician Assistant

## 2022-11-18 MED ORDER — ATORVASTATIN CALCIUM 40 MG PO TABS: 40.0000 mg | ORAL_TABLET | Freq: Every day | ORAL | 3 refills | Status: DC

## 2022-11-18 NOTE — Telephone Encounter (Signed)
Please advise if ok to refill. 

## 2022-11-21 ENCOUNTER — Telehealth: Payer: Self-pay | Admitting: Internal Medicine

## 2022-11-21 NOTE — Telephone Encounter (Signed)
Received a call to the [facility] GI on-call pager.  Patient states that she took the first half of her colonoscopy preparation and then subsequently developed nausea and vomiting afterwards.  She does feel like she vomited up a large amount of her prep.  She has already started to have bowel movements.  I recommended that she mix 64 ounces of Gatorade with 238 g of MiraLAX as a substitute for the prep that she vomited.  Patient already has some Zofran at home and will take this to prevent additional episodes of nausea and vomiting.  She plans to consume the MiraLAX/Gatorade combination. Will CC Dr. Chales Abrahams to this telephone note.

## 2022-11-22 ENCOUNTER — Ambulatory Visit (AMBULATORY_SURGERY_CENTER): Payer: 59 | Admitting: Gastroenterology

## 2022-11-22 ENCOUNTER — Encounter: Payer: Self-pay | Admitting: Gastroenterology

## 2022-11-22 VITALS — BP 102/49 | HR 75 | Temp 97.3°F | Resp 11 | Ht 66.5 in | Wt 219.0 lb

## 2022-11-22 DIAGNOSIS — K625 Hemorrhage of anus and rectum: Secondary | ICD-10-CM

## 2022-11-22 DIAGNOSIS — K648 Other hemorrhoids: Secondary | ICD-10-CM | POA: Diagnosis not present

## 2022-11-22 MED ORDER — DOCUSATE SODIUM 100 MG PO CAPS: 200.0000 mg | ORAL_CAPSULE | Freq: Every day | ORAL | 0 refills | Status: DC

## 2022-11-22 MED ORDER — SODIUM CHLORIDE 0.9 % IV SOLN: 500.0000 mL | Freq: Once | INTRAVENOUS | Status: DC

## 2022-11-22 NOTE — Op Note (Addendum)
[facility] Patient Name: Kristen Hoover Procedure Date: 11/22/2022 7:45 AM MRN: [id number] Endoscopist: Lynann Bologna , MD, [id number] Age: 37 Referring MD:  Date of Birth: August 05, 1985 Gender: Female Account #: [id number] Procedure:                Colonoscopy Indications:              Rectal bleeding Medicines:                Monitored Anesthesia Care Procedure:                Pre-Anesthesia Assessment:                           - Prior to the procedure, a History and Physical                            was performed, and patient medications and                            allergies were reviewed. The patient's tolerance of                            previous anesthesia was also reviewed. The risks                            and benefits of the procedure and the sedation                            options and risks were discussed with the patient.                            All questions were answered, and informed consent                            was obtained. Prior Anticoagulants: The patient has                            taken Coumadin (warfarin), last dose was 5 days                            prior to procedure. ASA Grade Assessment: II - A                            patient with mild systemic disease. After reviewing                            the risks and benefits, the patient was deemed in                            satisfactory condition to undergo the procedure.                           After obtaining informed consent, the colonoscope  was passed under direct vision. Throughout the                            procedure, the patient's blood pressure, pulse, and                            oxygen saturations were monitored continuously. The                            CF [id number] #[id number] was introduced through the anus                            and advanced to the 2 cm into the ileum. The                            colonoscopy was  performed without difficulty. The                            patient tolerated the procedure well. The quality                            of the bowel preparation was good. The terminal                            ileum, ileocecal valve, appendiceal orifice, and                            rectum were photographed. Scope In: 8:06:18 AM Scope Out: 8:19:03 AM Scope Withdrawal Time: 0 hours 8 minutes 17 seconds  Total Procedure Duration: 0 hours 12 minutes 45 seconds  Findings:                 Non-bleeding internal hemorrhoids were found during                            retroflexion. The hemorrhoids were small and Grade                            I (internal hemorrhoids that do not prolapse).                           The terminal ileum appeared normal.                           The exam was otherwise without abnormality on                            direct and retroflexion views. Rare early                            diverticula were noted in the sigmoid colon. Complications:            No immediate complications. Estimated Blood Loss:     Estimated blood loss: none. Impression:               -  Non-bleeding internal hemorrhoids.                           - The examined portion of the ileum was normal.                           - The examination was otherwise normal on direct                            and retroflexion views.                           - No specimens collected. Recommendation:           - Patient has a contact number available for                            emergencies. The signs and symptoms of potential                            delayed complications were discussed with the                            patient. Return to normal activities tomorrow.                            Written discharge instructions were provided to the                            patient.                           - High fiber diet. If still with hard stools, add                            Colace 1  tablet p.o. daily.                           - Resume Coumadin (warfarin) at prior dose today.                            Resume Lovenox from today onwards.                           - Preparation H cream: Apply externally BID PRN x                            10 day                           - Repeat colonoscopy in 10 years for screening                            purposes. Earlier, if with any new problems or  change in family history                           - The findings and recommendations were discussed                            with the patient's family. Lynann Bologna, MD 11/22/2022 8:25:58 AM This report has been signed electronically.

## 2022-11-22 NOTE — Patient Instructions (Signed)
YOU HAD AN ENDOSCOPIC PROCEDURE TODAY AT THE [facility]:   Refer to the procedure report that was given to you for any specific questions about what was found during the examination.  If the procedure report does not answer your questions, please call your gastroenterologist to clarify.  If you requested that your care partner not be given the details of your procedure findings, then the procedure report has been included in a sealed envelope for you to review at your convenience later.  YOU SHOULD EXPECT: Some feelings of bloating in the abdomen. Passage of more gas than usual.  Walking can help get rid of the air that was put into your GI tract during the procedure and reduce the bloating. If you had a lower endoscopy (such as a colonoscopy or flexible sigmoidoscopy) you may notice spotting of blood in your stool or on the toilet paper. If you underwent a bowel prep for your procedure, you may not have a normal bowel movement for a few days.  Please Note:  You might notice some irritation and congestion in your nose or some drainage.  This is from the oxygen used during your procedure.  There is no need for concern and it should clear up in a day or so.  SYMPTOMS TO REPORT IMMEDIATELY:  Following lower endoscopy (colonoscopy or flexible sigmoidoscopy):  Excessive amounts of blood in the stool  Significant tenderness or worsening of abdominal pains  Swelling of the abdomen that is new, acute  Fever of 100F or higher   For urgent or emergent issues, a gastroenterologist can be reached at any hour by calling [phone]. Do not use MyChart messaging for urgent concerns.    DIET:  We do recommend a small meal at first, but then you may proceed to your regular diet.  Drink plenty of fluids but you should avoid alcoholic beverages for 24 hours. In general, follow a High Fiber Diet. If you are still having hard stools, add Colace 200mg  by mouth daily.  MEDICATIONS: Continue present  medications. Resume Coumadin (Warfarin) at prior dose today. Resume Lovenox from today onwards. Use Preparation H Cream: apply liberally to rectum twice daily as needed for 10 days.  FOLLOW UP: Repeat colonoscopy in 10 years for screening purposes. Earlier, if with any new problems or change in family history.  Please see handouts given to you by your recovery nurse: High Fiber Diet, Diverticulosis, Hemorroids.  Thank you for allowing [place] to provide for your healthcare needs today.  ACTIVITY:  You should plan to take it easy for the rest of today and you should NOT DRIVE or use heavy machinery until tomorrow (because of the sedation medicines used during the test).    FOLLOW UP: Our staff will call the number listed on your records the next business day following your procedure.  We will call around 7:15- 8:00 am to check on you and address any questions or concerns that you may have regarding the information given to you following your procedure. If we do not reach you, we will leave a message.     If any biopsies were taken you will be contacted by phone or by letter within the next 1-3 weeks.  Please call us at [phone] if you have not heard about the biopsies in 3 weeks.    SIGNATURES/CONFIDENTIALITY: You and/or your care partner have signed paperwork which will be entered into your electronic medical record.  These signatures attest to the fact that that the  information above on your After Visit Summary has been reviewed and is understood.  Full responsibility of the confidentiality of this discharge information lies with you and/or your care-partner.

## 2022-11-22 NOTE — Progress Notes (Signed)
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 She presents to our  office today as referred by Alyssa Allwardt PA-C for further evaluation regarding blood per the rectum. She endorsed seeing a small amount of bright red blood on toilet tissue without associated anorectal pain. She also recall passing a moderate amount of bright red blood per the rectum on one occasion 02/2018 which occurred after traveling to attend her sister's wedding. Her bowel pattern varies. She passes a soft, hard or loose stools most days. She occasionally strains to pass a bowel movement. Paternal grandfather with history of colon cancer.   She underwent a neurology evaluation by Dr. Adriana Mccallum 02/2022 secondary to having headaches since 12/2021. A brain MRI 02/12/2022 showed a subacute small right thalamic infarct and remote right small right cerebellar infarct.  Head CTA showed evidence of an evolving infarct as seen on prior brain MRI and the additional smaller infarcts seen on that study were no longer present and the head/neck vasculature was normal.  She was seen by cardiologist Dr. Elease Hashimoto 03/06/2022, to rule out etiology for suspected embolic strokes.  She was placed on a 30-day cardiac monitor which did not show any evidence of atrial fibrillation.  TEE showed LV EF 55 to 60%, no evidence of an atrial thrombus and a small PFO could not be excluded. An agitated saline contrast bubble study was positive with shunting observed within 3-6 cardiac cycles suggestive of interatrial shunt. She was evaluated by hematologist Dr. 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   ECHO 03/22/2022: IMPRESSIONS Left ventricular ejection fraction, by estimation, is 55 to 60%. The left ventricle has normal function. The left ventricle has no regional wall motion abnormalities. 1. 2. Right ventricular systolic function is normal. The right ventricular size is normal. 3. No left atrial/left atrial appendage thrombus was detected. 4. The mitral valve is normal in structure. Trivial mitral valve regurgitation. 5. The aortic valve is tricuspid. Aortic valve regurgitation is not visualized. Cannot exclude a small PFO. Agitated saline contrast bubble study was positive with shunting observed within 3-6 cardiac cycles suggestive of interatrial shunt                                Latest Ref Rng & Units 03/18/2022    2:38 PM 01/11/2021    8:15 AM 03/01/2020    9:27 AM  CBC  WBC 3.4 - 10.8 x10E3/uL 11.2  8.5  10.4   Hemoglobin 11.1 - 15.9 g/dL 28.4  13.2  44.0   Hematocrit 34.0 - 46.6 % 35.8  38.2  36.8   Platelets 150 - 450 x10E3/uL 267  228.0  253.0                                       Latest Ref Rng & Units 03/18/2022    2:38 PM 01/11/2021    8:15 AM 03/01/2020    9:27 AM  CMP  Glucose 70 - 99 mg/dL  75  96  86   BUN 6 - 20 mg/dL 10  12  9    Creatinine 0.57 - 1.00 mg/dL 9.56  2.13  0.86   Sodium 134 - 144 mmol/L 142  139  136   Potassium 3.5 - 5.2 mmol/L 4.1  4.2  3.7   Chloride 96 - 106 mmol/L 104  106  103   CO2 20 - 29 mmol/L 23  26  26    Calcium 8.7 - 10.2 mg/dL 9.5  9.1  9.4   Total Protein 6.0 - 8.3 g/dL   7.3  7.6   Total Bilirubin 0.2 - 1.2 mg/dL   0.8  0.6   Alkaline Phos 39 - 117 U/L   83  84   AST 0 - 37 U/L   14  24   ALT 0 - 35 U/L   33  31                     Past Medical History:  Diagnosis Date   Anxiety and depression     Frequency of urination     GERD (gastroesophageal reflux disease)     History of blood clots     History of pancreatitis      12-28-2018-  last episode 2006   PCOS  (polycystic ovarian syndrome)     Renal calculus, right     Stroke [facility])     Urgency of urination     Wears glasses               Past Surgical History:  Procedure Laterality Date   BUBBLE STUDY   03/22/2022    Procedure: BUBBLE STUDY;  Surgeon: Lewayne Bunting, MD;  Location: [facility] ENDOSCOPY;  Service: Cardiovascular;;   CYSTOSCOPY WITH RETROGRADE PYELOGRAM, URETEROSCOPY AND STENT PLACEMENT Right 01/05/2019    Procedure: CYSTOSCOPY/URETEROSCOP/STENT PLACEMENT;  Surgeon: Noel Christmas, MD;  Location: [facility];  Service: Urology;  Laterality: Right;  75 MINS   CYSTOSCOPY WITH RETROGRADE PYELOGRAM, URETEROSCOPY AND STENT PLACEMENT Right 02/09/2019    Procedure: CYSTOSCOPY WITH RETROGRADE PYELOGRAM, URETEROSCOPY AND STENT PLACEMENT;  Surgeon: Noel Christmas, MD;  Location: [facility];  Service: Urology;  Laterality: Right;  1 HR   ESOPHAGOGASTRODUODENOSCOPY   2015   HOLMIUM LASER APPLICATION Right 02/09/2019    Procedure: HOLMIUM LASER APPLICATION;  Surgeon: Noel Christmas, MD;  Location: [facility];  Service: Urology;  Laterality: Right;   TEE WITHOUT CARDIOVERSION N/A 03/22/2022    Procedure: TRANSESOPHAGEAL ECHOCARDIOGRAM (TEE);  Surgeon: Lewayne Bunting, MD;  Location: [facility] ENDOSCOPY;  Service: Cardiovascular;  Laterality: N/A;   TONSILLECTOMY   2014   WISDOM TOOTH EXTRACTION   2018        Social History: She is married.  She is a Engineer, agricultural.  She previously smoked marijuana.  No alcohol use.   Family History: family history includes Colon cancer in her paternal grandfather; Diabetes in her brother and father; Fibromyalgia in her mother; Headache in her mother; Heart failure in her father; Neuropathy in her father. Allergies  No Known Allergies           Outpatient Encounter Medications as of 05/06/2022  Medication Sig   ALPRAZolam (XANAX) 0.5 MG tablet Take 1/2 to 1 tab po BID prn for acute panic.   aspirin 81 MG  chewable tablet Chew 81 mg by mouth daily.   atorvastatin (LIPITOR) 40 MG tablet Take 1 tablet (40 mg  total) by mouth daily.   calcium carbonate (TUMS - DOSED IN MG ELEMENTAL CALCIUM) 500 MG chewable tablet Chew 2 tablets by mouth daily as needed for indigestion or heartburn.   enoxaparin (LOVENOX) 150 MG/ML injection Inject 1 mL (150 mg total) into the skin daily. TAKE AS DIRECTED BY ANTICOAGULATION CLINIC EVERY MORNING   escitalopram (LEXAPRO) 10 MG tablet Take 10 mg by mouth daily.   folic acid (FOLVITE) 1 MG tablet Take 1 mg by mouth daily.   metFORMIN (GLUCOPHAGE-XR) 750 MG 24 hr tablet Take 750 mg by mouth 2 (two) times daily.   OMEGA-3-ACID ETHYL ESTERS PO Take 650 mg by mouth daily.   ondansetron (ZOFRAN) 4 MG tablet Take 1 tablet (4 mg total) by mouth every 8 (eight) hours as needed for nausea or vomiting.   Prasterone, DHEA, (DHEA PO) Take 5 mg by mouth daily.   spironolactone (ALDACTONE) 50 MG tablet Take 1 tablet (50 mg total) by mouth daily.   TRINTELLIX 5 MG TABS tablet Take 5 mg by mouth daily.   warfarin (COUMADIN) 5 MG tablet TAKE ONE TABLET BY MOUTH DAILY OR AS DIRECTED BY ANTICOAGULATION CLINIC   Azelastine HCl 137 MCG/SPRAY SOLN PLACE 1 SPRAY INTO BOTH NOSTRILS 2 (TWO) TIMES DAILY. USE IN EACH NOSTRIL AS DIRECTED (Patient not taking: Reported on 05/06/2022)   FLUoxetine (PROZAC) 10 MG capsule Take 10 mg by mouth daily. (Patient not taking: Reported on 05/06/2022)   ibuprofen (ADVIL) 200 MG tablet Take 400 mg by mouth every 6 (six) hours as needed for headache, mild pain or moderate pain. (Patient not taking: Reported on 05/06/2022)   meclizine (ANTIVERT) 12.5 MG tablet Take 1 tablet (12.5 mg total) by mouth 3 (three) times daily as needed for dizziness. (Patient not taking: Reported on 04/11/2022)      No facility-administered encounter medications on file as of 05/06/2022.        REVIEW OF SYSTEMS:  Gen: + Fatigue.Denies fever, sweats or chills. No weight loss.  CV: Denies  chest pain, palpitations or edema. Resp: Denies cough, shortness of breath of hemoptysis.  GI: See HPI.  No GERD symptoms.   GU : Denies urinary burning, blood in urine, increased urinary frequency or incontinence. MS: + Back pain.  Derm: Denies rash, itchiness, skin lesions or unhealing ulcers. Psych: + Anxiety.  Heme: Denies bruising, easy bleeding. Neuro: + Headaches. Endo:  Denies any problems with DM, thyroid or adrenal function.   PHYSICAL EXAM: BP 118/68   Pulse 88   Ht 5\' 6"  (1.676 m)   Wt 224 lb (101.6 kg)   LMP 04/11/2022 (Exact Date) Comment: neg preg 3/15  BMI 36.15 kg/m  General: 37 year old female in no acute distress. Head: Normocephalic and atraumatic. Eyes:  Sclerae non-icteric, conjunctive pink. Ears: Normal auditory acuity. Mouth: Dentition intact. No ulcers or lesions.  Neck: Supple, no lymphadenopathy or thyromegaly.  Lungs: Clear bilaterally to auscultation without wheezes, crackles or rhonchi. Heart: Regular rate and rhythm. No murmur, rub or gallop appreciated.  Abdomen: Soft, nontender, nondistended. No masses. No hepatosplenomegaly. Normoactive bowel sounds x 4 quadrants.  Rectal: No anal fissures or external hemorrhoids.  Small internal hemorrhoids palpated without prolapse.  No stool or blood in the rectal vault. Loysie CMA present for exam. Musculoskeletal: Symmetrical with no gross deformities. Skin: Warm and dry. No rash or lesions on visible extremities. Extremities: No edema. Neurological: Alert oriented x 4, no focal deficits.  Psychological:  Alert and cooperative. Normal mood and affect.   ASSESSMENT AND  PLAN:     37 year old female with several recent episodes of bright red blood on the toilet tissue and one episode of moderate volume hematochezia 02/2018 without recurrence.  Currently on Lovenox and Coumadin. -Colonoscopy deferred for now as patient was diagnosed with an acute subacute small right thalamic infarct and remote right small  right cerebellar infarct 02/2022. Patient to follow-up with neurology and cardiology prior to pursuing a diagnostic colonoscopy. -Schedule a colonoscopy later this summer when neurostatus stable and when Coumadin can be held with Lovenox bridging -Colonoscopy benefits and risks discussed including risk with sedation, risk of bleeding, perforation and infection  -Apply a small amount of Desitin inside the anal opening and to the external anal area three times daily as needed for anal or hemorrhoidal irritation/bleeding.  -Patient to contact office if rectal bleeding recurs   Variable bowel pattern. -Drink 8 glasses of water daily -Benefiber 1 tablespoon daily as tolerated   Positive antiphospholipid antibody on ASA, Lovenox until INR therapeutic on Coumadin   CVA in setting of elevated APS antibodies. Brain MRI 02/12/2022 showed a subacute small right thalamic infarct and remote right small right cerebellar infarct. On ASA, Lovenox until INR therapeutic on Coumadin -Follow-up with cardiologist Dr. Elease Hashimoto 06/05/2022 -Follow-up with neurologist Dr. Everlena Cooper 06/20/2022    Attending physician's note   I have taken history, reviewed the chart and examined the patient. I performed a substantive portion of this encounter, including complete performance of at least one of the key components, in conjunction with the APP. I agree with the Advanced Practitioner's note, impression and recommendations.   For colon today Coumadin held, on Lovenox bridging   Edman Circle, MD [facility] [phone]

## 2022-11-22 NOTE — Progress Notes (Signed)
Pt's states no medical or surgical changes since previsit or office visit. 

## 2022-11-22 NOTE — Telephone Encounter (Signed)
Thx a lot She did great RG

## 2022-11-22 NOTE — Progress Notes (Signed)
Sedate, gd SR, tolerated procedure well, VSS, report to RN 

## 2022-11-25 ENCOUNTER — Telehealth: Payer: Self-pay

## 2022-11-25 NOTE — Telephone Encounter (Signed)
Left message on answering machine. 

## 2022-11-29 ENCOUNTER — Telehealth: Payer: Self-pay

## 2022-11-29 ENCOUNTER — Ambulatory Visit (INDEPENDENT_AMBULATORY_CARE_PROVIDER_SITE_OTHER): Payer: 59

## 2022-11-29 DIAGNOSIS — Z7901 Long term (current) use of anticoagulants: Secondary | ICD-10-CM | POA: Diagnosis not present

## 2022-11-29 LAB — POCT INR: INR: 1.7 — AB (ref 2.0–3.0)

## 2022-11-29 NOTE — Patient Instructions (Addendum)
Pre visit review using our clinic review tool, if applicable. No additional management support is needed unless otherwise documented below in the visit note.  Increase dose today to take 3 tablets and then continue 2 tablets daily except take 3 tablets on Tuesday, Thursday and Saturday. Recheck in 2 weeks at [place].

## 2022-11-29 NOTE — Telephone Encounter (Signed)
Pt in coumadin clinic today and requested to check into getting an INR machine and testing at home. Advised a msg would be sent to PCP and if she agrees an order will be faxed. Pt appreciative and verbalized understanding.

## 2022-11-29 NOTE — Progress Notes (Signed)
Pt had colonoscopy on 11/15 and was placed on a lovenox bridge.  Increase dose today to take 3 tablets and then continue 2 tablets daily except take 3 tablets on Tuesday, Thursday and Saturday. Recheck in 2 weeks at [place].  Pt requested looking into a home INR machine. Advised a msg would be sent to her PCP to authorize home testing and if ok an order will be faxed to the company. Pt verbalized understanding.    Sent TE to PCP concerning home testing.

## 2022-12-02 NOTE — Telephone Encounter (Signed)
Per PCP, ok for pt to home test INR.   Order has been completed and signed by PCP.  Faxed order to [employer].

## 2022-12-03 ENCOUNTER — Ambulatory Visit: Payer: 59 | Admitting: Physician Assistant

## 2022-12-03 ENCOUNTER — Encounter: Payer: Self-pay | Admitting: Physician Assistant

## 2022-12-03 VITALS — BP 128/78 | HR 86 | Temp 97.1°F | Ht 66.5 in | Wt 219.6 lb

## 2022-12-03 DIAGNOSIS — D485 Neoplasm of uncertain behavior of skin: Secondary | ICD-10-CM | POA: Diagnosis not present

## 2022-12-03 NOTE — Progress Notes (Signed)
Patient ID: Kristen Hoover, female    DOB: 13-Apr-1985, 36 y.o.   MRN: [id number]   Assessment & Plan:  Neoplasm of uncertain behavior of skin   --Reassured, did not see obvious concern for malignancy today. Shared-decision making, will monitor at this time. She took a Building services engineer with her phone and will send through [place] for me. We can check size / color etc at next appt in the new year. --She will call back if any changes prior to then.   Subjective:    Chief Complaint  Patient presents with   Medical Management of Chronic Issues    Pt in office for f/u w/ PCP; and for mole removal on left shoulder/back    HPI Patient is in today for possible removal of mole on left posterior shoulder / upper back. States it has started to become raised in the last few years, but not sure if color or size has changed. Not really itchy or bothersome. She is on Coumadin.  Past Medical History:  Diagnosis Date   Anxiety and depression    Clotting disorder (HCC)    Frequency of urination    GERD (gastroesophageal reflux disease)    Heart murmur    History of blood clots    History of pancreatitis    12-28-2018-  last episode 2006   Hypertension    PCOS (polycystic ovarian syndrome)    Renal calculus, right    Stroke [facility])    Urgency of urination    Wears glasses     Past Surgical History:  Procedure Laterality Date   BUBBLE STUDY  03/22/2022   Procedure: BUBBLE STUDY;  Surgeon: Lewayne Bunting, MD;  Location: [facility] ENDOSCOPY;  Service: Cardiovascular;;   CYSTOSCOPY WITH RETROGRADE PYELOGRAM, URETEROSCOPY AND STENT PLACEMENT Right 01/05/2019   Procedure: CYSTOSCOPY/URETEROSCOP/STENT PLACEMENT;  Surgeon: Noel Christmas, MD;  Location: [facility];  Service: Urology;  Laterality: Right;  75 MINS   CYSTOSCOPY WITH RETROGRADE PYELOGRAM, URETEROSCOPY AND STENT PLACEMENT Right 02/09/2019   Procedure: CYSTOSCOPY WITH RETROGRADE PYELOGRAM, URETEROSCOPY AND STENT PLACEMENT;   Surgeon: Noel Christmas, MD;  Location: [facility];  Service: Urology;  Laterality: Right;  1 HR   ESOPHAGOGASTRODUODENOSCOPY  2015   HOLMIUM LASER APPLICATION Right 02/09/2019   Procedure: HOLMIUM LASER APPLICATION;  Surgeon: Noel Christmas, MD;  Location: [facility];  Service: Urology;  Laterality: Right;   TEE WITHOUT CARDIOVERSION N/A 03/22/2022   Procedure: TRANSESOPHAGEAL ECHOCARDIOGRAM (TEE);  Surgeon: Lewayne Bunting, MD;  Location: [facility] ENDOSCOPY;  Service: Cardiovascular;  Laterality: N/A;   TONSILLECTOMY  2014   WISDOM TOOTH EXTRACTION  2018    Family History  Problem Relation Age of Onset   Fibromyalgia Mother    Headache Mother    Neuropathy Father    Diabetes Father    Heart failure Father    Diabetes Brother    Breast cancer Maternal Grandmother    Colon cancer Paternal Grandfather    Liver disease Neg Hx    Esophageal cancer Neg Hx    Colon polyps Neg Hx    Rectal cancer Neg Hx    Stomach cancer Neg Hx     Social History   Tobacco Use   Smoking status: Never   Smokeless tobacco: Never  Vaping Use   Vaping status: Never Used  Substance Use Topics   Alcohol use: Yes    Comment: Rare   Drug use: Not Currently    Types:  Marijuana    Comment: 12-28-2018  per pt last used 5 wks ago     No Known Allergies  Review of Systems NEGATIVE UNLESS OTHERWISE INDICATED IN HPI      Objective:     BP 128/78 (BP Location: Left Arm, Patient Position: Sitting)   Pulse 86   Temp (!) 97.1 F (36.2 C) (Temporal)   Ht 5' 6.5" (1.689 m)   Wt 219 lb 9.6 oz (99.6 kg)   LMP 10/26/2022 (Exact Date)   SpO2 97%   BMI 34.91 kg/m   Wt Readings from Last 3 Encounters:  12/03/22 219 lb 9.6 oz (99.6 kg)  11/22/22 219 lb (99.3 kg)  09/24/22 233 lb 14.4 oz (106.1 kg)    BP Readings from Last 3 Encounters:  12/03/22 128/78  11/22/22 (!) 102/49  09/24/22 110/72     Physical Exam Vitals and nursing note reviewed.  Constitutional:       Appearance: Normal appearance.  Cardiovascular:     Rate and Rhythm: Normal rate.  Pulmonary:     Effort: Pulmonary effort is normal.  Skin:    Findings: Lesion (left posterior upper shoulder there is an approx 1-2 mm raised oval nevus, flesh colored with a few darker brown pigmented areas) present.  Neurological:     General: No focal deficit present.     Mental Status: She is alert and oriented to person, place, and time.         Sanoe Hazan M Roxanne Panek, PA-C

## 2022-12-03 NOTE — Telephone Encounter (Signed)
Contacted pt who reports she was contacted by [employer] this morning and everything has been authorized and she knows what the cost will be. She is just waiting for them to set up training. Advised to keep coumadin clinic apt for next week and we can discuss/educate concerning testing.

## 2022-12-09 ENCOUNTER — Ambulatory Visit (INDEPENDENT_AMBULATORY_CARE_PROVIDER_SITE_OTHER): Payer: 59

## 2022-12-09 DIAGNOSIS — Z7901 Long term (current) use of anticoagulants: Secondary | ICD-10-CM

## 2022-12-09 LAB — POCT INR: INR: 1.9 — AB (ref 2.0–3.0)

## 2022-12-09 NOTE — Patient Instructions (Addendum)
Pre visit review using our clinic review tool, if applicable. No additional management support is needed unless otherwise documented below in the visit note.  Increase dose today to take 3 tablets and then change weekly dose to take 3 tablets daily except take 2 tablets on Monday, Wednesday and Friday. Recheck in 2 week.

## 2022-12-09 NOTE — Progress Notes (Signed)
Pt is now testing INR at home. Result today was 1.9 obtained from the [employer]. Increase dose today to take 3 tablets and then change weekly dose to take 3 tablets daily except take 2 tablets on Monday, Wednesday and Friday. Recheck in 2 week. LVM with instructions.

## 2022-12-09 NOTE — Telephone Encounter (Signed)
Pt has been set up with home INR testing by [employer] today and reported INR result for today to their website. LVM for pt with dosing instructions for INR today.

## 2022-12-11 ENCOUNTER — Ambulatory Visit: Payer: 59

## 2022-12-16 LAB — POCT INR: INR: 2.4 (ref 2.0–3.0)

## 2022-12-17 ENCOUNTER — Ambulatory Visit (INDEPENDENT_AMBULATORY_CARE_PROVIDER_SITE_OTHER): Payer: 59

## 2022-12-17 DIAGNOSIS — Z7901 Long term (current) use of anticoagulants: Secondary | ICD-10-CM | POA: Diagnosis not present

## 2022-12-17 NOTE — Patient Instructions (Addendum)
Pre visit review using our clinic review tool, if applicable. No additional management support is needed unless otherwise documented below in the visit note.  Continue 3 tablets daily except take 2 tablets on Monday, Wednesday and Friday. Recheck in 2 week.

## 2022-12-17 NOTE — Progress Notes (Signed)
Pt is now testing INR at home. Result was posted last night after clinic hours. Result was 2.4  Continue 3 tablets daily except take 2 tablets on Monday, Wednesday and Friday. Recheck in 2 week. Contacted pt and advised of dosing and retest date. Pt verbalized understanding.

## 2022-12-18 ENCOUNTER — Ambulatory Visit: Payer: 59 | Admitting: Neurology

## 2022-12-24 ENCOUNTER — Other Ambulatory Visit: Payer: Self-pay | Admitting: Physician Assistant

## 2022-12-24 ENCOUNTER — Ambulatory Visit: Payer: 59 | Admitting: Allergy & Immunology

## 2022-12-25 ENCOUNTER — Encounter: Payer: 59 | Attending: Physician Assistant | Admitting: Dietician

## 2022-12-25 DIAGNOSIS — E66812 Obesity, class 2: Secondary | ICD-10-CM | POA: Diagnosis present

## 2022-12-25 DIAGNOSIS — Z6836 Body mass index (BMI) 36.0-36.9, adult: Secondary | ICD-10-CM | POA: Diagnosis present

## 2022-12-25 NOTE — Patient Instructions (Signed)
1-Balanced breakfast-add protein 2-Avoid skipping lunch  Snack option or Ensure or plate planner

## 2022-12-25 NOTE — Progress Notes (Signed)
Medical Nutrition Therapy  Appointment Start time:  1000  Appointment End time:  1108  Primary concerns today: PCOS, weight management, vitamin K interaction Referral diagnosis: Class 2 severe obesity with serious comorbidity and body mass index (BMI) of 36.0 to 36.9 in adult, unspecified obesity type  Preferred learning style:   no preference indicated Learning readiness:  ready- change in progress   NUTRITION ASSESSMENT   Clinical Medical Hx:  Past Medical History:  Diagnosis Date   Anxiety and depression    Clotting disorder (HCC)    Frequency of urination    GERD (gastroesophageal reflux disease)    Heart murmur    History of blood clots    History of pancreatitis    12-28-2018-  last episode 2006   Hypertension    PCOS (polycystic ovarian syndrome)    Renal calculus, right    Stroke [facility])    Urgency of urination    Wears glasses     Medications:  Current Outpatient Medications:    ALPRAZolam (XANAX) 0.5 MG tablet, Take 1/2 to 1 tab po BID prn for acute panic., Disp: 30 tablet, Rfl: 2   aspirin 81 MG chewable tablet, Chew 81 mg by mouth daily., Disp: , Rfl:    atorvastatin (LIPITOR) 40 MG tablet, Take 1 tablet (40 mg total) by mouth daily., Disp: 90 tablet, Rfl: 3   busPIRone (BUSPAR) 5 MG tablet, Take 5 mg by mouth 2 (two) times daily., Disp: , Rfl:    calcium carbonate (TUMS - DOSED IN MG ELEMENTAL CALCIUM) 500 MG chewable tablet, Chew 2 tablets by mouth daily as needed for indigestion or heartburn., Disp: , Rfl:    docusate sodium (COLACE) 100 MG capsule, Take 2 capsules (200 mg total) by mouth daily., Disp: 30 capsule, Rfl: 0   folic acid (FOLVITE) 1 MG tablet, Take 1 mg by mouth daily., Disp: , Rfl:    levocetirizine (XYZAL) 5 MG tablet, Take 1 tablet (5 mg total) by mouth in the morning and at bedtime., Disp: 180 tablet, Rfl: 1   metFORMIN (GLUCOPHAGE-XR) 750 MG 24 hr tablet, Take 750 mg by mouth 2 (two) times daily., Disp: , Rfl:    OMEGA-3-ACID ETHYL ESTERS PO,  Take 650 mg by mouth daily., Disp: , Rfl:    ondansetron (ZOFRAN) 4 MG tablet, TAKE 1 TABLET BY MOUTH EVERY 8 HOURS AS NEEDED FOR NAUSEA AND VOMITING, Disp: 30 tablet, Rfl: 2   Semaglutide-Weight Management (WEGOVY) 0.5 MG/0.5ML SOAJ, INJECT 0.5 MG INTO THE SKIN ONE TIME PER WEEK, Disp: 2 mL, Rfl: 0   TRINTELLIX 5 MG TABS tablet, Take 5 mg by mouth daily., Disp: , Rfl:    Turmeric (QC TUMERIC COMPLEX) 500 MG CAPS, Take 500 mg by mouth daily., Disp: , Rfl:    Vortioxetine HBr (TRINTELLIX PO), Take by mouth., Disp: , Rfl:    warfarin (COUMADIN) 5 MG tablet, TAKE 3  TABLETS BY MOUTH DAILY EXCEPT TAKE 2 TABLETS ON MONDAYS AND FRIDAYS OR AS DIRECTED BY ANTICOAGULATION CLINIC, Disp: 240 tablet, Rfl: 1   enoxaparin (LOVENOX) 150 MG/ML injection, Inject 1 mL (150 mg total) into the skin daily. TAKE AS DIRECTED BY ANTICOAGULATION CLINIC (Patient not taking: Reported on 12/25/2022), Disp: 9 mL, Rfl: 0   OVER THE COUNTER MEDICATION, Take 5 mg by mouth daily at 6 (six) AM. DHEA 5, Disp: , Rfl:    spironolactone (ALDACTONE) 50 MG tablet, Take 1 tablet (50 mg total) by mouth daily., Disp: 90 tablet, Rfl: 3  Current Facility-Administered  Medications:    0.9 %  sodium chloride infusion, 500 mL, Intravenous, Once, Lynann Bologna, MD   Labs:  Lab Results  Component Value Date   HGBA1C 5.0 05/14/2022   Lab Results  Component Value Date   CHOL 130 05/14/2022   HDL 40.80 05/14/2022   LDLCALC 73 05/14/2022   TRIG 80.0 05/14/2022   CHOLHDL 3 05/14/2022   Lab Results  Component Value Date   INR 2.4 12/16/2022   INR 1.9 (A) 12/09/2022   INR 1.7 (A) 11/29/2022      Wt Readings from Last 3 Encounters:  12/03/22 219 lb 9.6 oz (99.6 kg)  11/22/22 219 lb (99.3 kg)  09/24/22 233 lb 14.4 oz (106.1 kg)   Notable Signs/Symptoms: Pt avoids pork   Lifestyle & Dietary Hx Pt present today alone. Pt reports she works form home full time.  Pt reports she lives with her husband and stepsons. Pt reports she and her  husband share cooking and procuring responsibilities. Pt reports eating out three to four times weekly.  Pt reports she has access to a stationary bike and free weights at home gym.  Pt reports she has recently started [facility]. Pt reportsafter recent dose increase of weekly GLP she c/o  no appetite and feels she c/o nausea and reduced appetite.  Pt reports she is concerned regarding her intake of vitamin K and intake of leafy greens. Pt c/o PCOS diagnosis and is unsure how many carbohydrate foods are acceptable for her intake.  Pt reports meat "repulses" her and is open to trying some plant based protein options. Pt denies recent emotion eating. All Pt's questions were answered during this encounter.   Estimated daily fluid intake: 16.9 * 4-5 bottles daily oz Supplements: tumeric, tums, fish oil, DHEA Sleep: no complaints Stress / self-care: 7 out of 10 / art, coloring Current average weekly physical activity: 0  24-Hr Dietary Recall First Meal: cream cheese, 1 eggo waffle, ensure protein or max water Snack: none Second Meal: 2 pm: leftovers or taco bell black bean crunch wrap supreme (77 grams of carb) or skips 5/d/w or eggo with cream cheese or protein bar kind or rx bar Snack: none Third Meal: Taco bell or asian or rice, shrimp, zucchini, onions or chicken, rice, broccoli or shrimp, noodles, alfredo sauce, broccoli Snack: occasionally chips Beverages: water   NUTRITION DIAGNOSIS  NB-1.2 Harmful beliefs/attitudes about food or nutrition-related topics (use with caution) As related to no prior nutrition related education.  As evidenced by Pt reports and dietary recall.   NUTRITION INTERVENTION  Nutrition education (E-1) on the following topics:  Fruits & Vegetables: Aim to fill half your plate with a variety of fruits and vegetables. They are rich in vitamins, minerals, and fiber, and can help reduce the risk of chronic diseases. Choose a colorful assortment of fruits and vegetables to  ensure you get a wide range of nutrients. Grains and Starches: Make at least half of your grain choices whole grains, such as brown rice, whole wheat bread, and oats. Whole grains provide fiber, which aids in digestion and healthy cholesterol levels. Aim for whole forms of starchy vegetables such as potatoes, sweet potatoes, beans, peas, and corn, which are fiber rich and provide many vitamins and minerals.  Protein: Incorporate lean sources of protein, such as poultry, fish, beans, nuts, and seeds, into your meals. Protein is essential for building and repairing tissues, staying full, balancing blood sugar, as well as supporting immune function. Dairy: Include low-fat or fat-free  dairy products like milk, yogurt, and cheese in your diet. Dairy foods are excellent sources of calcium and vitamin D, which are crucial for bone health.  Physical Activity: Aim for 60 minutes of physical activity daily. Regular physical activity promotes overall health-including helping to reduce risk for heart disease and diabetes, promoting mental health, and helping [place] sleep better.    Handouts Provided Include  Snack Ideas Plate Planner Move Your Way-[facility]   Learning Style & Readiness for Change Teaching method utilized: Visual & Auditory  Demonstrated degree of understanding via: Teach Back  Barriers to learning/adherence to lifestyle change: unknown  Goals Established by Pt 1-Balanced breakfast-add protein 2-Avoid skipping lunch  Snack option or Ensure or plate planner   MONITORING & EVALUATION Dietary intake, weekly physical activity  Next Steps  Patient is to return PRN.

## 2022-12-26 ENCOUNTER — Other Ambulatory Visit: Payer: Self-pay | Admitting: Physician Assistant

## 2022-12-26 ENCOUNTER — Ambulatory Visit: Payer: 59 | Admitting: Neurology

## 2022-12-26 DIAGNOSIS — Z7901 Long term (current) use of anticoagulants: Secondary | ICD-10-CM

## 2022-12-27 ENCOUNTER — Ambulatory Visit: Payer: 59 | Admitting: Neurology

## 2022-12-27 ENCOUNTER — Encounter: Payer: Self-pay | Admitting: Neurology

## 2022-12-27 VITALS — BP 102/68 | HR 96 | Ht 66.0 in | Wt 215.2 lb

## 2022-12-27 DIAGNOSIS — I639 Cerebral infarction, unspecified: Secondary | ICD-10-CM

## 2022-12-27 DIAGNOSIS — Q2112 Patent foramen ovale: Secondary | ICD-10-CM | POA: Diagnosis not present

## 2022-12-27 DIAGNOSIS — R760 Raised antibody titer: Secondary | ICD-10-CM | POA: Diagnosis not present

## 2022-12-27 DIAGNOSIS — G5622 Lesion of ulnar nerve, left upper limb: Secondary | ICD-10-CM

## 2022-12-27 NOTE — Progress Notes (Signed)
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  Based on small size, surgical intervention not indicated. 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      Warfarin Atorvastatin 40mg  daily.  LDL goal less than 70.   Normotensive blood pressure Hgb A1c goal less than 7.   Follow up in 6 months.  Total time spent in chart and face to face with patient:  40 minutes.       Subjective:  Kristen Hoover is a 37 year old right-handed female with PCOS, depression, and history of kidney stone who follows up for stroke.  Recent hospital records reviewed.  UPDATE: In July, patient was in [facility] [place] when she developed left arm numbness and tingling that lasted 8 hours.  She presented to [facility].  She endorsed that she was leaning on her elbow on the table and noticed the numbness and tingling in the left arm and involving the last 2 digits of her left hand when she stood up.  Admitted for stroke workup.  INR was therapeutic.  MRI of brain revealed no acute stroke.  CTA head and neck negative for LVO or hemodynamically significant stenosis.  LDL 56.  Hgb A1c 5.8.  Numbness appeared to be in the ulnar distribution and she was ultimately diagnosed with ulnar neuropathy.  Symptoms improved in the hospital.  No recurrence.  No longer leans on her elbows.  No headaches or seeing floaters.    Current NSAIDS/analgesics:  ASA 81mg  daily, Tylenol Current muscle relaxants:  Zofran Current Antihypertensive medications:  none Current Antidepressant medications:  fluoxetine 40mg  daily Current Anticonvulsant medications:  none Current Antihistamines/Decongestants:   meclizine (not using it) Birth control:  none Other medications:  Warfarin, atorvastatin 40mg  daily, Buspar 15mg  BID   HISTORY: She began experiencing a persistent daily headache in early December.  She describes a sharp pressure pain behind the left eye, left eyebrow tender to touch. Tylenol helps take the edge off. She also had a couple of episodes of horizontal double vision lasting a few minutes.  She started seeing floaters in her vision.  More recently she sees "an amber shooting star" in her vision.  She saw ophthalmology and was prescribed prism glasses.  She was prescribed a prednisone taper at that time which helped.  However, she started having episodes of vertigo, spinning sensation lasting a couple of minutes associated with ringing in the left ear.   She had an MRI of the brain with and without contrast on 02/12/2022 which was personally reviewed and revealed small subacute infarcts within the right thalamus and right centrum semiovale as well as tiny remote infarct within the left cerebellar hemisphere.  In hindsight, she notes that her left pinky finger feels numb and that her left cheek feels slightly different to touch.  No slurred speech or unilateral weakness.  She was advised to start ASA 81mg  daily.  Hypercoagulable panel on 2/9 revealed Beta 2 glycoprotein IgG 77 and anticardiolipin antibody of 40 but otherwise WNL.  Started on warfarin.  Hgb A1c 5.0.  LDL was 149.  Started atorvastatin 40mg  daily.  CTA head and neck on 2/14 was normal.  2D echo on 3/11 revealed EF 60-65% with no valvular disease and negative  bubble study.  She saw cardiology  TEE on 3/15 revealed EF 55-60% but agitated saline contrast bubble study was positive suggestive of interatrial shunt.  As plan is for lifelong anticoagulation, PFO closure felt not to be indicated.       She denies prior history of migraines or other headaches.  Her brother was killed in a MVC in May 2023.  Over the holidays, she started  experiencing increased depression.  She has noted possible palpitations but unsure if it is anxiety.  She is taking medical leave to have adjustments made in treatment for her depression.  Denies family history of stroke at early age.  She does not have stroke risk factors such as heart disease, diabetes, hypertension or cigarette smoking.  However, she has slight hyperlipidemia and smokes cannabis.  She just recently quit smoking cannabis and has already noted improvement in her headaches.    PAST MEDICAL HISTORY: Past Medical History:  Diagnosis Date   Anxiety and depression    Clotting disorder (HCC)    Frequency of urination    GERD (gastroesophageal reflux disease)    Heart murmur    History of blood clots    History of pancreatitis    12-28-2018-  last episode 2006   Hypertension    PCOS (polycystic ovarian syndrome)    Renal calculus, right    Stroke [facility])    Urgency of urination    Wears glasses     MEDICATIONS: Current Outpatient Medications on File Prior to Visit  Medication Sig Dispense Refill   ALPRAZolam (XANAX) 0.5 MG tablet Take 1/2 to 1 tab po BID prn for acute panic. 30 tablet 2   aspirin 81 MG chewable tablet Chew 81 mg by mouth daily.     atorvastatin (LIPITOR) 40 MG tablet Take 1 tablet (40 mg total) by mouth daily. 90 tablet 3   busPIRone (BUSPAR) 5 MG tablet Take 5 mg by mouth 2 (two) times daily.     calcium carbonate (TUMS - DOSED IN MG ELEMENTAL CALCIUM) 500 MG chewable tablet Chew 2 tablets by mouth daily as needed for indigestion or heartburn.     docusate sodium (COLACE) 100 MG capsule Take 2 capsules (200 mg total) by mouth daily. 30 capsule 0   enoxaparin (LOVENOX) 150 MG/ML injection Inject 1 mL (150 mg total) into the skin daily. TAKE AS DIRECTED BY ANTICOAGULATION CLINIC (Patient not taking: Reported on 12/25/2022) 9 mL 0   folic acid (FOLVITE) 1 MG tablet Take 1 mg by mouth daily.     levocetirizine (XYZAL) 5 MG tablet Take 1 tablet (5 mg total) by  mouth in the morning and at bedtime. 180 tablet 1   metFORMIN (GLUCOPHAGE-XR) 750 MG 24 hr tablet Take 750 mg by mouth 2 (two) times daily.     OMEGA-3-ACID ETHYL ESTERS PO Take 650 mg by mouth daily.     ondansetron (ZOFRAN) 4 MG tablet TAKE 1 TABLET BY MOUTH EVERY 8 HOURS AS NEEDED FOR NAUSEA AND VOMITING 30 tablet 2   OVER THE COUNTER MEDICATION Take 5 mg by mouth daily at 6 (six) AM. DHEA 5     Semaglutide-Weight Management (WEGOVY) 0.5 MG/0.5ML SOAJ INJECT 0.5 MG INTO THE SKIN ONE TIME PER WEEK 2 mL 0   spironolactone (ALDACTONE) 50 MG tablet Take 1 tablet (50 mg total) by mouth daily. 90 tablet 3   TRINTELLIX 5 MG TABS tablet Take 5 mg by mouth daily.     Turmeric (QC TUMERIC COMPLEX) 500 MG  CAPS Take 500 mg by mouth daily.     Vortioxetine HBr (TRINTELLIX PO) Take by mouth.     warfarin (COUMADIN) 5 MG tablet TAKE 3  TABLETS BY MOUTH DAILY EXCEPT TAKE 2 TABLETS ON MONDAYS AND FRIDAYS OR AS DIRECTED BY ANTICOAGULATION CLINIC 240 tablet 1   Current Facility-Administered Medications on File Prior to Visit  Medication Dose Route Frequency Provider Last Rate Last Admin   0.9 %  sodium chloride infusion  500 mL Intravenous Once Lynann Bologna, MD        ALLERGIES: No Known Allergies  FAMILY HISTORY: Family History  Problem Relation Age of Onset   Fibromyalgia Mother    Headache Mother    Neuropathy Father    Diabetes Father    Heart failure Father    Diabetes Brother    Breast cancer Maternal Grandmother    Colon cancer Paternal Grandfather    Liver disease Neg Hx    Esophageal cancer Neg Hx    Colon polyps Neg Hx    Rectal cancer Neg Hx    Stomach cancer Neg Hx       Objective:  Blood pressure 112/62, pulse 83, height 5\' 6"  (1.676 m), weight 230 lb (104.3 kg), SpO2 98 %. General: No acute distress.  Patient appears well-groomed.   Head:  Normocephalic/atraumatic Neck:  Supple.  No paraspinal tenderness.  Full range of motion. Heart:  Regular rate and rhythm. Neuro:   Alert and oriented.  Speech fluent and not dysarthric.  Language intact.  CN II-XII intact.  Bulk and tone normal.  Muscle strength 5/5 throughout.  Deep tendon reflexes 2+ throughout.  Gait normal.  Romberg negative.    Shon Millet, DO  CC: Kristen Allwardt, PA-C

## 2022-12-30 ENCOUNTER — Other Ambulatory Visit: Payer: Self-pay | Admitting: Physician Assistant

## 2022-12-30 ENCOUNTER — Ambulatory Visit (INDEPENDENT_AMBULATORY_CARE_PROVIDER_SITE_OTHER): Payer: 59

## 2022-12-30 DIAGNOSIS — Z7901 Long term (current) use of anticoagulants: Secondary | ICD-10-CM | POA: Diagnosis not present

## 2022-12-30 LAB — POCT INR: INR: 2.1 (ref 2.0–3.0)

## 2022-12-30 MED ORDER — WARFARIN SODIUM 5 MG PO TABS: ORAL_TABLET | ORAL | 1 refills | Status: DC

## 2022-12-30 NOTE — Progress Notes (Addendum)
Pt is now testing INR at home. Result was 2.1  Continue 3 tablets daily except take 2 tablets on Monday, Wednesday and Friday. Recheck in 4 week, on 01/27/23. Contacted pt and advised of dosing and retest date. Pr requested refill of warfarin.  Pt is compliant with warfarin management and PCP apts.  Sent in refill of warfarin to requested pharmacy.

## 2022-12-30 NOTE — Addendum Note (Signed)
Addended by: Sherrie George A on: 12/30/2022 12:40 PM   Modules accepted: Orders

## 2022-12-30 NOTE — Patient Instructions (Addendum)
Pre visit review using our clinic review tool, if applicable. No additional management support is needed unless otherwise documented below in the visit note.  Continue 3 tablets daily except take 2 tablets on Monday, Wednesday and Friday. Recheck in 4 week, on 01/27/23.

## 2023-01-15 ENCOUNTER — Inpatient Hospital Stay: Payer: 59 | Attending: Hematology and Oncology

## 2023-01-15 ENCOUNTER — Other Ambulatory Visit: Payer: Self-pay | Admitting: Hematology and Oncology

## 2023-01-15 ENCOUNTER — Inpatient Hospital Stay: Payer: 59 | Admitting: Hematology and Oncology

## 2023-01-15 VITALS — BP 116/73 | HR 80 | Temp 97.1°F | Resp 15 | Wt 207.9 lb

## 2023-01-15 DIAGNOSIS — Z79899 Other long term (current) drug therapy: Secondary | ICD-10-CM | POA: Diagnosis not present

## 2023-01-15 DIAGNOSIS — D6861 Antiphospholipid syndrome: Secondary | ICD-10-CM | POA: Diagnosis present

## 2023-01-15 DIAGNOSIS — Z800 Family history of malignant neoplasm of digestive organs: Secondary | ICD-10-CM | POA: Insufficient documentation

## 2023-01-15 DIAGNOSIS — R760 Raised antibody titer: Secondary | ICD-10-CM

## 2023-01-15 DIAGNOSIS — Z803 Family history of malignant neoplasm of breast: Secondary | ICD-10-CM | POA: Diagnosis not present

## 2023-01-15 LAB — CBC WITH DIFFERENTIAL (CANCER CENTER ONLY)
Abs Immature Granulocytes: 0.03 10*3/uL (ref 0.00–0.07)
Basophils Absolute: 0.1 10*3/uL (ref 0.0–0.1)
Basophils Relative: 1 %
Eosinophils Absolute: 0.2 10*3/uL (ref 0.0–0.5)
Eosinophils Relative: 2 %
HCT: 38.7 % (ref 36.0–46.0)
Hemoglobin: 13.8 g/dL (ref 12.0–15.0)
Immature Granulocytes: 0 %
Lymphocytes Relative: 29 %
Lymphs Abs: 2.9 10*3/uL (ref 0.7–4.0)
MCH: 28.3 pg (ref 26.0–34.0)
MCHC: 35.7 g/dL (ref 30.0–36.0)
MCV: 79.3 fL — ABNORMAL LOW (ref 80.0–100.0)
Monocytes Absolute: 0.7 10*3/uL (ref 0.1–1.0)
Monocytes Relative: 7 %
Neutro Abs: 6.1 10*3/uL (ref 1.7–7.7)
Neutrophils Relative %: 61 %
Platelet Count: 257 10*3/uL (ref 150–400)
RBC: 4.88 MIL/uL (ref 3.87–5.11)
RDW: 13.7 % (ref 11.5–15.5)
WBC Count: 10.1 10*3/uL (ref 4.0–10.5)
nRBC: 0 % (ref 0.0–0.2)

## 2023-01-15 LAB — CMP (CANCER CENTER ONLY)
ALT: 20 U/L (ref 0–44)
AST: 16 U/L (ref 15–41)
Albumin: 4.5 g/dL (ref 3.5–5.0)
Alkaline Phosphatase: 101 U/L (ref 38–126)
Anion gap: 7 (ref 5–15)
BUN: 14 mg/dL (ref 6–20)
CO2: 26 mmol/L (ref 22–32)
Calcium: 9.4 mg/dL (ref 8.9–10.3)
Chloride: 106 mmol/L (ref 98–111)
Creatinine: 0.86 mg/dL (ref 0.44–1.00)
GFR, Estimated: >60 mL/min (ref >60)
Glucose, Bld: 97 mg/dL (ref 70–99)
Potassium: 4 mmol/L (ref 3.5–5.1)
Sodium: 139 mmol/L (ref 135–145)
Total Bilirubin: 0.5 mg/dL (ref 0.0–1.2)
Total Protein: 7.8 g/dL (ref 6.5–8.1)

## 2023-01-15 NOTE — Progress Notes (Signed)
 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 All other were WNL.  04/10/2022: establish care with Dr. Federico   Interval History:  Kristen Hoover 38 y.o. 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 The patient's last visit was on 07/10/2022 at which time she established care. In the interim since the last visit she is continued faithfully on her Coumadin  therapy and has been steadily within her INR goal range.  On exam today Kristen Hoover reports she has been well overall with interim since her last visit.  She reports that her Coumadin  is typically between 2.0 and 3.0 but occasionally she has a few blips where it leaves that range.  She reports he has an INR device at home and send the reports to her Coumadin  physician.  She reports she is tolerating the medication with no bleeding, bruising, or dark stools.  She reports her appetite has decreased as she is now on Wegovy  therapy.  Her weight is dropped down to 207 pounds, down from 230 previously.  She reports that it is not pleasant.  She typically has some nausea and a sensation of fullness.  She reports that she feels like her body is working more optimally on the Wegovy .  Her energy is about a 6 or 7 out of 10.  She is also doing her best to try to drink Ensure and plenty of water.  She reports her menstrual cycles  are typically steady with 1 heavy day but not heavy the whole cycle.  Overall she is willing and able to continue on Coumadin  therapy at this time.  She denies any fevers, chills, sweats, nausea, vomiting or diarrhea.  A full 10 point ROS is otherwise negative.  MEDICAL HISTORY:  Past Medical History:  Diagnosis Date   Anxiety and depression    Clotting disorder (HCC)    Frequency of urination    GERD (gastroesophageal reflux disease)    Heart murmur    History of blood clots    History of pancreatitis    12-28-2018-  last episode 2006   Hypertension    PCOS (polycystic ovarian syndrome)    Renal calculus, right    Stroke [facility])    Urgency of urination    Wears glasses     SURGICAL HISTORY: Past Surgical History:  Procedure Laterality Date   BUBBLE STUDY  03/22/2022   Procedure: BUBBLE STUDY;  Surgeon: Pietro Redell RAMAN, MD;  Location[facility] ENDOSCOPY;  Service: Cardiovascular;;   CYSTOSCOPY WITH RETROGRADE PYELOGRAM, URETEROSCOPY AND STENT PLACEMENT Right 01/05/2019   Procedure: CYSTOSCOPY/URETEROSCOP/STENT PLACEMENT;  Surgeon: Elisabeth Valli BIRCH, MD;  Location: [facility];  Service: Urology;  Laterality: Right;  75 MINS   CYSTOSCOPY WITH RETROGRADE PYELOGRAM, URETEROSCOPY AND STENT PLACEMENT Right 02/09/2019   Procedure: CYSTOSCOPY WITH RETROGRADE PYELOGRAM, URETEROSCOPY AND STENT PLACEMENT;  Surgeon: Elisabeth Valli BIRCH, MD;  Location: [facility];  Service: Urology;  Laterality: Right;  1 HR   ESOPHAGOGASTRODUODENOSCOPY  2015   HOLMIUM LASER APPLICATION Right 02/09/2019   Procedure: HOLMIUM LASER APPLICATION;  Surgeon: Elisabeth Valli BIRCH, MD;  Location: [facility];  Service: Urology;  Laterality: Right;   TEE WITHOUT CARDIOVERSION N/A 03/22/2022   Procedure: TRANSESOPHAGEAL ECHOCARDIOGRAM (TEE);  Surgeon: Pietro Redell RAMAN, MD;  Location: [facility] ENDOSCOPY;  Service: Cardiovascular;  Laterality: N/A;   TONSILLECTOMY  2014   WISDOM TOOTH EXTRACTION   2018    SOCIAL HISTORY: Social History   Socioeconomic History   Marital status: Married    Spouse name: Not on file   Number of children: 0   Years of education: Not on file   Highest education level: Bachelor's degree (e.g., BA, AB, BS)  Occupational History   Occupation: engineer, agricultural  Tobacco Use   Smoking status: Never   Smokeless tobacco: Never  Vaping Use   Vaping status: Never Used  Substance and Sexual Activity   Alcohol use: Yes    Comment: Rare   Drug use: Not Currently    Types: Marijuana    Comment: 12-28-2018  per pt last used 5 wks ago   Sexual activity: Not on file    Comment: husband had vasectomy  Other Topics Concern   Not on file  Social History Narrative   Are you right handed or left handed? Right    Are you currently employed ?    What is your current occupation?   Do you live at home alone?   Who lives with you?    What type of home do you live in: 1 story or 2 story? 2       Social Drivers of Corporate Investment Banker Strain: Low Risk  (05/13/2022)   Overall Financial Resource Strain (CARDIA)    Difficulty of Paying Living Expenses: Not hard at all  Food Insecurity: No Food Insecurity (12/25/2022)   Hunger Vital Sign    Worried About Running Out of Food in the Last Year: Never true    Ran Out of Food in the Last Year: Never true  Transportation Needs: No Transportation Needs (05/13/2022)   PRAPARE - Administrator, Civil Service (Medical): No    Lack of Transportation (Non-Medical): No  Physical Activity: Sufficiently Active (05/13/2022)   Exercise Vital Sign    Days of Exercise per Week: 5 days    Minutes of Exercise per Session: 30 min  Stress: Stress Concern Present (05/13/2022)   [employer] of Occupational Health - Occupational Stress Questionnaire    Feeling of Stress : To some extent  Social Connections: Unknown (11/08/2022)   Received from [facility]   Social Network    Social Network: Not on file   Intimate Partner Violence: Unknown (11/08/2022)   Received from [facility]   HITS    Physically Hurt: Not on file    Insult or Talk Down To: Not on file    Threaten Physical Harm: Not on file    Scream or Curse: Not on file    FAMILY HISTORY: Family History  Problem Relation Age of Onset   Fibromyalgia Mother    Headache Mother    Neuropathy Father    Diabetes Father    Heart failure Father    Diabetes Brother    Breast cancer Maternal Grandmother    Colon cancer Paternal Grandfather    Liver disease Neg Hx    Esophageal cancer Neg Hx    Colon polyps Neg Hx    Rectal cancer Neg  Hx    Stomach cancer Neg Hx     ALLERGIES:  has no known allergies.  MEDICATIONS:  Current Outpatient Medications  Medication Sig Dispense Refill   ALPRAZolam  (XANAX ) 0.5 MG tablet Take 1/2 to 1 tab po BID prn for acute panic. 30 tablet 2   aspirin  81 MG chewable tablet Chew 81 mg by mouth daily.     atorvastatin  (LIPITOR) 40 MG tablet Take 1 tablet (40 mg total) by mouth daily. 90 tablet 3   busPIRone  (BUSPAR ) 5 MG tablet Take 5 mg by mouth 2 (two) times daily.     calcium  carbonate (TUMS - DOSED IN MG ELEMENTAL CALCIUM ) 500 MG chewable tablet Chew 2 tablets by mouth daily as needed for indigestion or heartburn.     docusate sodium  (COLACE) 100 MG capsule Take 2 capsules (200 mg total) by mouth daily. 30 capsule 0   enoxaparin  (LOVENOX ) 150 MG/ML injection Inject 1 mL (150 mg total) into the skin daily. TAKE AS DIRECTED BY ANTICOAGULATION CLINIC 9 mL 0   folic acid  (FOLVITE ) 1 MG tablet Take 1 mg by mouth daily.     levocetirizine (XYZAL ) 5 MG tablet Take 1 tablet (5 mg total) by mouth in the morning and at bedtime. 180 tablet 1   metFORMIN  (GLUCOPHAGE -XR) 750 MG 24 hr tablet Take 750 mg by mouth 2 (two) times daily.     OMEGA-3-ACID ETHYL ESTERS PO Take 650 mg by mouth daily.     ondansetron  (ZOFRAN ) 4 MG tablet TAKE 1 TABLET BY MOUTH EVERY 8 HOURS AS NEEDED FOR NAUSEA AND VOMITING 30 tablet 2    OVER THE COUNTER MEDICATION Take 5 mg by mouth daily at 6 (six) AM. DHEA 5     Semaglutide -Weight Management (WEGOVY ) 0.5 MG/0.5ML SOAJ INJECT 0.5 MG INTO THE SKIN ONE TIME PER WEEK 2 mL 0   spironolactone  (ALDACTONE ) 50 MG tablet Take 1 tablet (50 mg total) by mouth daily. 90 tablet 3   TRINTELLIX  5 MG TABS tablet Take 5 mg by mouth daily.     Turmeric (QC TUMERIC COMPLEX) 500 MG CAPS Take 500 mg by mouth daily.     Vortioxetine  HBr (TRINTELLIX  PO) Take by mouth.     warfarin (COUMADIN ) 5 MG tablet TAKE 3 TABLETS BY MOUTH DAILY EXCEPT 2 TABS ON MONDAYS, WEDNESDAYS AND FRIDAYS AS DIRECTED BY CLINIC 265 tablet 1   Current Facility-Administered Medications  Medication Dose Route Frequency Provider Last Rate Last Admin   0.9 %  sodium chloride  infusion  500 mL Intravenous Once Charlanne Groom, MD        REVIEW OF SYSTEMS:   Constitutional: ( - ) fevers, ( - )  chills , ( - ) night sweats Eyes: ( - ) blurriness of vision, ( - ) double vision, ( - ) watery eyes Ears, nose, mouth, throat, and face: ( - ) mucositis, ( - ) sore throat Respiratory: ( - ) cough, ( - ) dyspnea, ( - ) wheezes Cardiovascular: ( - ) palpitation, ( - ) chest discomfort, ( - ) lower extremity swelling Gastrointestinal:  ( - ) nausea, ( - ) heartburn, ( - ) change in bowel habits Skin: ( - ) abnormal skin rashes Lymphatics: ( - ) new lymphadenopathy, ( - ) easy bruising Neurological: ( - ) numbness, ( - ) tingling, ( - ) new weaknesses Behavioral/Psych: ( - ) mood change, ( - ) new changes  All other systems were reviewed with the patient and are negative.  PHYSICAL EXAMINATION:  Vitals:   01/15/23 0903  BP: 116/73  Pulse: 80  Resp: 15  Temp: (!) 97.1 F (36.2 C)  SpO2: 99%    Filed Weights   01/15/23 0903  Weight: 207 lb 14.4 oz (94.3 kg)     GENERAL: Well-appearing middle-aged Caucasian female, alert, no distress and comfortable SKIN: skin color, texture, turgor are normal, no rashes or significant  lesions EYES: conjunctiva are pink and non-injected, sclera clear LUNGS: clear to auscultation and percussion with normal breathing effort HEART: regular rate & rhythm and no murmurs and no lower extremity edema Musculoskeletal: no cyanosis of digits and no clubbing  PSYCH: alert & oriented x 3, fluent speech NEURO: no focal motor/sensory deficits  LABORATORY DATA:  I have reviewed the data as listed    Latest Ref Rng & Units 01/15/2023    8:39 AM 08/14/2022   10:18 AM 07/10/2022    8:19 AM  CBC  WBC 4.0 - 10.5 K/uL 10.1  11.4  10.6   Hemoglobin 12.0 - 15.0 g/dL 86.1  86.6  86.9   Hematocrit 36.0 - 46.0 % 38.7  39.9  36.5   Platelets 150 - 400 K/uL 257  253.0  203        Latest Ref Rng & Units 01/15/2023    8:39 AM 07/10/2022    8:19 AM 05/14/2022    8:56 AM  CMP  Glucose 70 - 99 mg/dL 97  94  95   BUN 6 - 20 mg/dL 14  15  14    Creatinine 0.44 - 1.00 mg/dL 9.13  9.23  9.19   Sodium 135 - 145 mmol/L 139  138  138   Potassium 3.5 - 5.1 mmol/L 4.0  4.2  4.5   Chloride 98 - 111 mmol/L 106  108  105   CO2 22 - 32 mmol/L 26  23  27    Calcium  8.9 - 10.3 mg/dL 9.4  9.2  9.2   Total Protein 6.5 - 8.1 g/dL 7.8  6.7  7.2   Total Bilirubin 0.0 - 1.2 mg/dL 0.5  0.3  0.3   Alkaline Phos 38 - 126 U/L 101  83  86   AST 15 - 41 U/L 16  14  17    ALT 0 - 44 U/L 20  20  27      RADIOGRAPHIC STUDIES: No results found.  ASSESSMENT & PLAN Kristen Hoover 38 y.o. 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  After review of the labs, review of the records, and discussion with the patient the patients findings are most consistent with elevated APS antibodies in the setting of a prior CVA.   Antiphospholipid antibody syndrome is a hypercoagulation and autoimmune condition that results in increased frequency of venous and arterial thrombosis. In order to meet the diagnostic criteria for APS a patient must have one of both  the clinical and laboratory criteria noted from the [place] Criteria (noted below). Laboratory criteria must be met on at least 2 occasions, separated by at least 12 weeks. Presence of antibodies in the absence of clinical criteria fails to meet diagnostic criteria and does not merit treatment or further investigation.      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  Of the two the beta-2  glycoprotein is the more clinically significant. --  Lupus anticoagulant panel was also drawn, however this appears to been drawn while on anticoagulation. Results are difficult to interpret.  -- recommend patient continue Coumadin  therapy given the fact she had an arterial thrombus with an elevated antiphospholipid antibody. -- Patient's initial testing was on 02/15/2022.  Confirmatory testing in July 2024 confirmed persistently elevated Beta 2 glycoprotein IgG.  --Labs today show white blood cell count 10.1, hemoglobin 13.8, MCV 79.3, platelets 257 --RTC in 6 months for repeat evaluation.  Can consider extending to once yearly if patient is agreeable at next visit.    No orders of the defined types were placed in this encounter.   All questions were answered. The patient knows to call the clinic with any problems, questions or concerns.  A total of more than 25 minutes were spent on this encounter with face-to-face time and non-face-to-face time, including preparing to see the patient, ordering tests and/or medications, counseling the patient and coordination of care as outlined above.   Kristen IVAR Kidney, MD Department of Hematology/Oncology [facility] at [facility] Phone: [phone] Pager: [phone] Email: [email or web address]  01/15/2023 9:42 AM

## 2023-01-20 ENCOUNTER — Encounter: Payer: Self-pay | Admitting: Physician Assistant

## 2023-01-20 MED ORDER — METFORMIN HCL ER 750 MG PO TB24: 750.0000 mg | ORAL_TABLET | Freq: Two times a day (BID) | ORAL | 1 refills | Status: DC

## 2023-01-21 ENCOUNTER — Encounter: Payer: Self-pay | Admitting: Physician Assistant

## 2023-01-21 ENCOUNTER — Ambulatory Visit: Payer: 59 | Admitting: Physician Assistant

## 2023-01-21 VITALS — BP 122/74 | HR 84 | Temp 97.9°F | Ht 66.0 in | Wt 204.4 lb

## 2023-01-21 DIAGNOSIS — R112 Nausea with vomiting, unspecified: Secondary | ICD-10-CM

## 2023-01-21 DIAGNOSIS — E538 Deficiency of other specified B group vitamins: Secondary | ICD-10-CM | POA: Diagnosis not present

## 2023-01-21 DIAGNOSIS — E282 Polycystic ovarian syndrome: Secondary | ICD-10-CM | POA: Diagnosis not present

## 2023-01-21 DIAGNOSIS — E559 Vitamin D deficiency, unspecified: Secondary | ICD-10-CM

## 2023-01-21 DIAGNOSIS — R718 Other abnormality of red blood cells: Secondary | ICD-10-CM

## 2023-01-21 DIAGNOSIS — Z7689 Persons encountering health services in other specified circumstances: Secondary | ICD-10-CM

## 2023-01-21 LAB — IBC + FERRITIN
Ferritin: 27.1 ng/mL (ref 10.0–291.0)
Iron: 45 ug/dL (ref 42–145)
Saturation Ratios: 12.8 % — ABNORMAL LOW (ref 20.0–50.0)
TIBC: 352.8 ug/dL (ref 250.0–450.0)
Transferrin: 252 mg/dL (ref 212.0–360.0)

## 2023-01-21 LAB — VITAMIN B12: Vitamin B-12: 479 pg/mL (ref 211–911)

## 2023-01-21 LAB — VITAMIN D 25 HYDROXY (VIT D DEFICIENCY, FRACTURES): VITD: 56.91 ng/mL (ref 30.00–100.00)

## 2023-01-21 LAB — MAGNESIUM: Magnesium: 1.9 mg/dL (ref 1.5–2.5)

## 2023-01-21 MED ORDER — ONDANSETRON 4 MG PO TBDP: 4.0000 mg | ORAL_TABLET | Freq: Three times a day (TID) | ORAL | 0 refills | Status: DC | PRN

## 2023-01-21 MED ORDER — WEGOVY 0.25 MG/0.5ML ~~LOC~~ SOAJ: 0.2500 mg | SUBCUTANEOUS | 1 refills | Status: DC

## 2023-01-21 NOTE — Progress Notes (Signed)
 Patient ID: Kristen Hoover, female    DOB: 1985/01/14, 38 y.o.   MRN: [id number]   Assessment & Plan:  Encounter for weight management -     Wegovy ; Inject 0.25 mg into the skin once a week.  Dispense: 2 mL; Refill: 1 -     Ambulatory referral to Endocrinology -     IBC + Ferritin -     VITAMIN D  25 Hydroxy (Vit-D Deficiency, Fractures) -     Vitamin B12 -     Magnesium  PCOS (polycystic ovarian syndrome) -     Wegovy ; Inject 0.25 mg into the skin once a week.  Dispense: 2 mL; Refill: 1 -     Ambulatory referral to Endocrinology -     Magnesium  B12 deficiency -     Vitamin B12 -     Magnesium  Low mean corpuscular volume (MCV) -     IBC + Ferritin -     Magnesium  Vitamin D  deficiency -     VITAMIN D  25 Hydroxy (Vit-D Deficiency, Fractures) -     Magnesium  Nausea and vomiting in adult  Other orders -     Ondansetron ; Take 1 tablet (4 mg total) by mouth every 8 (eight) hours as needed for nausea or vomiting.  Dispense: 20 tablet; Refill: 0   Assessment and Plan    Wegovy  intolerance Experiencing frequent vomiting, fatigue, and lightheadedness since increasing dose to 0.5mg . Concerns about malnutrition and medication absorption due to vomiting. -Hold Wegovy  for a few weeks to allow for symptom resolution and nutritional recovery. -Resume Wegovy  at 0.25mg  in early February. -Consider Zofran  dissolvable for nausea/vomiting control.  Iron deficiency Indicated by low MCV on recent CBC. Patient reports low iron level from recent hematologist visit. -Increase dietary intake of iron-rich foods. -Check iron panel for further evaluation.  Vitamin deficiencies On Metformin , which can interfere with B12 absorption. Also taking Vitamin D  supplement but unsure if dose is adequate. -Check B12, Magnesium, Vitamin D , and Ferritin levels for further evaluation.  Polycystic Ovary Syndrome (PCOS) Currently on Metformin  but feels management is lacking. Interested in  seeing a specialist. -Referral to a PCOS specialist at [facility].  Follow-up in 6 weeks to reassess symptoms and response to medication changes.           Return in about 6 weeks (around 03/04/2023) for recheck/follow-up.    Subjective:    Chief Complaint  Patient presents with   Medication Problem    Pt in office for side effects to Wegovy ; pt is experiencing frequent vomiting; 0.25 no symptoms, when moving up to 0.5 started getting symptoms; vomiting several times a week; iron is low according to hematology; she is worried she isn't getting any nutrients; after taking injection on Wednesday's it starts a day or so later; Wanting to consult on options; feeling fatigued and lethargic as well; may look at other GLP options, needing guidance.      HPI Discussed the use of AI scribe software for clinical note transcription with the patient, who gave verbal consent to proceed.  History of Present Illness   The patient, with a history of PCOS and currently on metformin , presents with concerns about the side effects of her weight loss medication, Wegovy . She reports frequent vomiting, approximately three times a week, which she believes started after increasing the dose of Wegovy . The vomiting is severe enough that the patient is worried about the absorption of her other medications and her overall nutrition.  She also reports fatigue and lightheadedness, which she believes is exacerbated on days when she is unable to keep food down. The patient has tried eating smaller portions to alleviate the vomiting, but this has not been successful. She also reports constipation and diarrhea, suggesting possible gastrointestinal issues. The patient also mentions that her iron levels are low, as reported by her hematologist.       Past Medical History:  Diagnosis Date   Anxiety and depression    Clotting disorder (HCC)    Frequency of urination    GERD (gastroesophageal reflux disease)    Heart murmur     History of blood clots    History of pancreatitis    12-28-2018-  last episode 2006   Hypertension    PCOS (polycystic ovarian syndrome)    Renal calculus, right    Stroke [facility])    Urgency of urination    Wears glasses     Past Surgical History:  Procedure Laterality Date   BUBBLE STUDY  03/22/2022   Procedure: BUBBLE STUDY;  Surgeon: Pietro Redell RAMAN, MD;  Location: [facility] ENDOSCOPY;  Service: Cardiovascular;;   CYSTOSCOPY WITH RETROGRADE PYELOGRAM, URETEROSCOPY AND STENT PLACEMENT Right 01/05/2019   Procedure: CYSTOSCOPY/URETEROSCOP/STENT PLACEMENT;  Surgeon: Elisabeth Valli BIRCH, MD;  Location: [facility];  Service: Urology;  Laterality: Right;  75 MINS   CYSTOSCOPY WITH RETROGRADE PYELOGRAM, URETEROSCOPY AND STENT PLACEMENT Right 02/09/2019   Procedure: CYSTOSCOPY WITH RETROGRADE PYELOGRAM, URETEROSCOPY AND STENT PLACEMENT;  Surgeon: Elisabeth Valli BIRCH, MD;  Location: [facility];  Service: Urology;  Laterality: Right;  1 HR   ESOPHAGOGASTRODUODENOSCOPY  2015   HOLMIUM LASER APPLICATION Right 02/09/2019   Procedure: HOLMIUM LASER APPLICATION;  Surgeon: Elisabeth Valli BIRCH, MD;  Location[facility];  Service: Urology;  Laterality: Right;   TEE WITHOUT CARDIOVERSION N/A 03/22/2022   Procedure: TRANSESOPHAGEAL ECHOCARDIOGRAM (TEE);  Surgeon: Pietro Redell RAMAN, MD;  Location: [facility] ENDOSCOPY;  Service: Cardiovascular;  Laterality: N/A;   TONSILLECTOMY  2014   WISDOM TOOTH EXTRACTION  2018    Family History  Problem Relation Age of Onset   Fibromyalgia Mother    Headache Mother    Neuropathy Father    Diabetes Father    Heart failure Father    Diabetes Brother    Breast cancer Maternal Grandmother    Colon cancer Paternal Grandfather    Liver disease Neg Hx    Esophageal cancer Neg Hx    Colon polyps Neg Hx    Rectal cancer Neg Hx    Stomach cancer Neg Hx     Social History   Tobacco Use   Smoking status: Never   Smokeless tobacco:  Never  Vaping Use   Vaping status: Never Used  Substance Use Topics   Alcohol use: Yes    Comment: Rare   Drug use: Not Currently    Types: Marijuana    Comment: 12-28-2018  per pt last used 5 wks ago     No Known Allergies  Review of Systems NEGATIVE UNLESS OTHERWISE INDICATED IN HPI      Objective:     BP 122/74 (BP Location: Left Arm, Patient Position: Sitting, Cuff Size: Normal)   Pulse 84   Temp 97.9 F (36.6 C) (Temporal)   Ht 5' 6 (1.676 m)   Wt 204 lb 6.4 oz (92.7 kg)   LMP 12/22/2022 (Approximate)   SpO2 97%   BMI 32.99 kg/m   Wt Readings from Last 3 Encounters:  01/21/23 204 lb 6.4 oz (92.7 kg)  01/15/23 207 lb 14.4 oz (94.3 kg)  12/27/22 215 lb 3.2 oz (97.6 kg)    BP Readings from Last 3 Encounters:  01/21/23 122/74  01/15/23 116/73  12/27/22 102/68     Physical Exam Vitals and nursing note reviewed.  Constitutional:      General: She is not in acute distress.    Appearance: Normal appearance. She is not ill-appearing.  HENT:     Head: Normocephalic.     Right Ear: External ear normal.     Left Ear: External ear normal.  Eyes:     Extraocular Movements: Extraocular movements intact.     Conjunctiva/sclera: Conjunctivae normal.     Pupils: Pupils are equal, round, and reactive to light.  Cardiovascular:     Rate and Rhythm: Normal rate and regular rhythm.     Pulses: Normal pulses.     Heart sounds: Normal heart sounds. No murmur heard. Pulmonary:     Effort: Pulmonary effort is normal. No respiratory distress.     Breath sounds: Normal breath sounds. No wheezing.  Musculoskeletal:     Cervical back: Normal range of motion.  Skin:    General: Skin is warm.  Neurological:     Mental Status: She is alert and oriented to person, place, and time.  Psychiatric:        Mood and Affect: Mood normal.        Behavior: Behavior normal.         Hasheem Voland M Leander Tout, PA-C

## 2023-01-22 ENCOUNTER — Ambulatory Visit (INDEPENDENT_AMBULATORY_CARE_PROVIDER_SITE_OTHER): Payer: 59

## 2023-01-22 DIAGNOSIS — Z7901 Long term (current) use of anticoagulants: Secondary | ICD-10-CM

## 2023-01-22 LAB — POCT INR: INR: 3.6 — AB (ref 2.0–3.0)

## 2023-01-22 NOTE — Progress Notes (Signed)
 Pt is now testing INR at home. Result was 3.6 Pt reports using Wegovy  for weight loss and is having severe vomiting and is having difficulty eating . She has talked to PCP and they are gong to suspend for 2 weeks and hopefully restart in a couple of weeks  at a lower dose.  Hold dose today and then change weekly dose to take 2 tablets daily except take 3 tablets on Tuesday, Thursday and Saturday. Recheck in 2 week, on 1/29. Contacted pt and advised of dosing and retest date.

## 2023-01-22 NOTE — Patient Instructions (Addendum)
 Pre visit review using our clinic review tool, if applicable. No additional management support is needed unless otherwise documented below in the visit note.  Hold dose today and then change weekly dose to take 2 tablets daily except take 3 tablets on Tuesday, Thursday and Saturday. Recheck in 2 week, on 1/29.

## 2023-02-06 ENCOUNTER — Ambulatory Visit (INDEPENDENT_AMBULATORY_CARE_PROVIDER_SITE_OTHER): Payer: 59

## 2023-02-06 DIAGNOSIS — Z7901 Long term (current) use of anticoagulants: Secondary | ICD-10-CM

## 2023-02-06 LAB — POCT INR: INR: 1.8 — AB (ref 2.0–3.0)

## 2023-02-06 NOTE — Progress Notes (Signed)
Pt is now testing INR at home. Result was 1.8 Pt has been taken off of Wegovy for the last two weeks due to vomiting when she had a dose increase. Pt reports no further vomiting, but is now constipated. She reports diet is still not very consistent. Pt reports she will restart Wegovy this week at a lower dose. Increase dose today to take 4 tablets and then continue 2 tablets daily except take 3 tablets on Tuesday, Thursday and Saturday. Recheck in 2 week, on 2/13. Contacted pt and advised of dosing and retest date.

## 2023-02-06 NOTE — Patient Instructions (Addendum)
Pre visit review using our clinic review tool, if applicable. No additional management support is needed unless otherwise documented below in the visit note.  Increase dose today to take 4 tablets and then continue 2 tablets daily except take 3 tablets on Tuesday, Thursday and Saturday. Recheck in 2 week, on 2/13.

## 2023-02-13 ENCOUNTER — Ambulatory Visit: Payer: 59 | Admitting: Physician Assistant

## 2023-02-14 ENCOUNTER — Encounter: Payer: Self-pay | Admitting: Physician Assistant

## 2023-02-14 NOTE — Telephone Encounter (Signed)
 Please see message below and complete PA for patient dose 0.25 Wegovy . Pt states PA can be done by phone as well at number provided. Once completed please advise status for patient.

## 2023-02-19 ENCOUNTER — Telehealth: Payer: Self-pay

## 2023-02-19 ENCOUNTER — Other Ambulatory Visit (HOSPITAL_COMMUNITY): Payer: Self-pay

## 2023-02-19 NOTE — Telephone Encounter (Signed)
Pharmacy Patient Advocate Encounter   Received notification from Patient Advice Request messages that prior authorization for [facility] 0.5MG  is required/requested.   Insurance verification completed.   The patient is insured through [facility] .   Per test claim: Insurance only allows 2 fills of the same dose then patient must go up to the next dose. Ran test claim for 1mg , received a paid claim.   The current 28 day co-pay for [facility] 1MG  is, $310.  No PA needed at this time. This test claim was processed through [facility]- copay amounts may vary at other pharmacies due to pharmacy/plan contracts, or as the patient moves through the different stages of their insurance plan.

## 2023-02-20 ENCOUNTER — Ambulatory Visit (INDEPENDENT_AMBULATORY_CARE_PROVIDER_SITE_OTHER): Payer: 59

## 2023-02-20 DIAGNOSIS — Z7901 Long term (current) use of anticoagulants: Secondary | ICD-10-CM

## 2023-02-20 LAB — POCT INR: INR: 1.9 — AB (ref 2.0–3.0)

## 2023-02-20 NOTE — Patient Instructions (Addendum)
Pre visit review using our clinic review tool, if applicable. No additional management support is needed unless otherwise documented below in the visit note.  Increase dose today to take 4 tablets and then change weekly dosing to take 3 tablets daily except take 2 tablets on Monday, Wednesday and Friday. Recheck in 3 week, on 3/10.

## 2023-02-20 NOTE — Progress Notes (Signed)
Pt is now testing INR at home. Result was 1.9 today.  Increase dose today to take 4 tablets and then change weekly dosing to take 3 tablets daily except take 2 tablets on Monday, Wednesday and Friday. Recheck in 3 week, on 3/10. Contacted pt and advised of dosing and retest date.

## 2023-02-21 NOTE — Telephone Encounter (Signed)
Please see note from PCP below, and start process needed to keep patient at current dose. Thanks

## 2023-02-21 NOTE — Telephone Encounter (Signed)
Please see past messages and advise your recommendations on next steps

## 2023-02-24 ENCOUNTER — Ambulatory Visit: Payer: 59 | Admitting: Skilled Nursing Facility1

## 2023-03-03 ENCOUNTER — Ambulatory Visit: Payer: 59 | Admitting: Neurology

## 2023-03-04 ENCOUNTER — Ambulatory Visit: Payer: 59 | Admitting: Physician Assistant

## 2023-03-04 VITALS — BP 120/74 | HR 91 | Temp 98.2°F | Ht 66.0 in | Wt 211.4 lb

## 2023-03-04 DIAGNOSIS — F32A Depression, unspecified: Secondary | ICD-10-CM

## 2023-03-04 DIAGNOSIS — E66812 Obesity, class 2: Secondary | ICD-10-CM

## 2023-03-04 DIAGNOSIS — F419 Anxiety disorder, unspecified: Secondary | ICD-10-CM

## 2023-03-04 DIAGNOSIS — Z7901 Long term (current) use of anticoagulants: Secondary | ICD-10-CM

## 2023-03-04 DIAGNOSIS — E282 Polycystic ovarian syndrome: Secondary | ICD-10-CM

## 2023-03-04 DIAGNOSIS — R760 Raised antibody titer: Secondary | ICD-10-CM

## 2023-03-04 NOTE — Progress Notes (Signed)
 Patient ID: Kristen Hoover, female    DOB: 1985-06-07, 38 y.o.   MRN: [id number]   Assessment & Plan:  Long term (current) use of anticoagulants [Z79.01]  PCOS (polycystic ovarian syndrome)  Anxiety and depression  Antiphospholipid antibody positive  Class 2 severe obesity due to excess calories with serious comorbidity in adult, unspecified BMI (HCC)   Assessment and Plan    Depression Recent interruption in Trintellix therapy due to cost, leading to worsening mental health symptoms including increased anxiety and panic attacks. Resumed Trintellix yesterday. -Continue Trintellix as prescribed. -Increase Buspar from 10mg  to 15mg  twice daily to help manage increased anxiety. -Consider use of Xanax as needed for acute anxiety episodes.  Weight Management / PCOS Difficulty obtaining Wegovy due to insurance restrictions, leading to increased appetite and weight gain.  -If insurance issues persist, consider alternative options such as Wellbutrin, pending verification of compatibility with Trintellix.  Antiphospholipid Antibody positive -Stable, managed with coumadin clinic      Return in about 3 months (around 06/01/2023) for recheck/follow-up.    Subjective:    Chief Complaint  Patient presents with   Medical Management of Chronic Issues    Pt in office for 6 wk f/u; pt admits to not being able to take [facility] for the past 6 wks due to insurance; pt states took last dose of .25 dose 2/1 but insurance is stating patient needs to titrate up to 1 mg dose and has been approved per PA note in chart;  Pt also states she was off her Trintellix for 3 weeks due to cost and finally just started taking again; will discuss with PCP today    HPI Discussed the use of AI scribe software for clinical note transcription with the patient, who gave verbal consent to proceed.  History of Present Illness   Kristen Hoover is a 38 year old female with weight management  issues and depression who presents with medication refill difficulties.  She is experiencing significant frustration due to difficulties in obtaining a refill for her [facility] prescription. Her insurance only allows eight doses per year, and she cannot refill the 0.25 mg dose until October. Reginal Lutes has been effective in helping her manage her weight and improve her overall health, but she is unable to continue the medication due to insurance restrictions. She has two 2.5 mg pens left and is considering self-dosing to extend her use.  She reports issues with her antidepressant medication, Trintellix. She recently refilled it for the first time in 2025 and was shocked by the cost of $310 for a 30-day supply, which she could not afford. Consequently, she went without the medication for two and a half weeks until she obtained a coupon, reducing the cost to $150 for 90 days. She restarted Trintellix yesterday and is concerned about potential side effects, such as intrusive thoughts, which she experienced when she first started the medication. During the period without Trintellix, she experienced a significant decline in her mental health, describing herself as being in a 'dark place'.  She reports increased anxiety and has experienced several panic attacks in the past few weeks, which is unusual for her. She has been using Xanax more frequently, having taken it three times in the last two weeks, despite not having used it for nine months prior. She prefers to avoid such medications. She has a prescription for Xanax from May last year and still has half a bottle left. She is currently taking Buspar 10 mg twice a  day, which she increased from 5 mg a month ago. She is considering increasing the dose further due to her current anxiety levels.  She describes obsessive-compulsive tendencies, such as washing her dog's feet after walks and requiring visitors to remove their shoes before entering her home. Her sleep is good,  and she has been stepping outside for sunlight, although her exercise has decreased significantly due to her mental state.       Past Medical History:  Diagnosis Date   Anxiety and depression    Clotting disorder (HCC)    Frequency of urination    GERD (gastroesophageal reflux disease)    Heart murmur    History of blood clots    History of pancreatitis    12-28-2018-  last episode 2006   Hypertension    PCOS (polycystic ovarian syndrome)    Renal calculus, right    Stroke [facility])    Urgency of urination    Wears glasses     Past Surgical History:  Procedure Laterality Date   BUBBLE STUDY  03/22/2022   Procedure: BUBBLE STUDY;  Surgeon: Lewayne Bunting, MD;  Location: [facility] ENDOSCOPY;  Service: Cardiovascular;;   CYSTOSCOPY WITH RETROGRADE PYELOGRAM, URETEROSCOPY AND STENT PLACEMENT Right 01/05/2019   Procedure: CYSTOSCOPY/URETEROSCOP/STENT PLACEMENT;  Surgeon: Noel Christmas, MD;  Location: [facility];  Service: Urology;  Laterality: Right;  75 MINS   CYSTOSCOPY WITH RETROGRADE PYELOGRAM, URETEROSCOPY AND STENT PLACEMENT Right 02/09/2019   Procedure: CYSTOSCOPY WITH RETROGRADE PYELOGRAM, URETEROSCOPY AND STENT PLACEMENT;  Surgeon: Noel Christmas, MD;  Location: [facility];  Service: Urology;  Laterality: Right;  1 HR   ESOPHAGOGASTRODUODENOSCOPY  2015   HOLMIUM LASER APPLICATION Right 02/09/2019   Procedure: HOLMIUM LASER APPLICATION;  Surgeon: Noel Christmas, MD;  Location: [facility];  Service: Urology;  Laterality: Right;   TEE WITHOUT CARDIOVERSION N/A 03/22/2022   Procedure: TRANSESOPHAGEAL ECHOCARDIOGRAM (TEE);  Surgeon: Lewayne Bunting, MD;  Location: [facility] ENDOSCOPY;  Service: Cardiovascular;  Laterality: N/A;   TONSILLECTOMY  2014   WISDOM TOOTH EXTRACTION  2018    Family History  Problem Relation Age of Onset   Fibromyalgia Mother    Headache Mother    Neuropathy Father    Diabetes Father    Heart failure Father     Diabetes Brother    Breast cancer Maternal Grandmother    Colon cancer Paternal Grandfather    Liver disease Neg Hx    Esophageal cancer Neg Hx    Colon polyps Neg Hx    Rectal cancer Neg Hx    Stomach cancer Neg Hx     Social History   Tobacco Use   Smoking status: Never   Smokeless tobacco: Never  Vaping Use   Vaping status: Never Used  Substance Use Topics   Alcohol use: Yes    Comment: Rare   Drug use: Not Currently    Types: Marijuana    Comment: 12-28-2018  per pt last used 5 wks ago     No Known Allergies  Review of Systems NEGATIVE UNLESS OTHERWISE INDICATED IN HPI      Objective:     BP 120/74 (BP Location: Left Arm, Patient Position: Sitting, Cuff Size: Normal)   Pulse 91   Temp 98.2 F (36.8 C) (Temporal)   Ht 5\' 6"  (1.676 m)   Wt 211 lb 6.4 oz (95.9 kg)   LMP 02/10/2023 (Approximate)   SpO2 97%   BMI 34.12 kg/m  Wt Readings from Last 3 Encounters:  03/04/23 211 lb 6.4 oz (95.9 kg)  01/21/23 204 lb 6.4 oz (92.7 kg)  01/15/23 207 lb 14.4 oz (94.3 kg)    BP Readings from Last 3 Encounters:  03/04/23 120/74  01/21/23 122/74  01/15/23 116/73     Physical Exam Vitals and nursing note reviewed.  Constitutional:      General: She is not in acute distress.    Appearance: Normal appearance. She is not ill-appearing.  HENT:     Head: Normocephalic.     Right Ear: External ear normal.     Left Ear: External ear normal.  Eyes:     Extraocular Movements: Extraocular movements intact.     Conjunctiva/sclera: Conjunctivae normal.     Pupils: Pupils are equal, round, and reactive to light.  Cardiovascular:     Rate and Rhythm: Normal rate and regular rhythm.     Pulses: Normal pulses.     Heart sounds: Normal heart sounds. No murmur heard. Pulmonary:     Effort: Pulmonary effort is normal. No respiratory distress.     Breath sounds: Normal breath sounds. No wheezing.  Musculoskeletal:     Cervical back: Normal range of motion.  Skin:     General: Skin is warm.  Neurological:     Mental Status: She is alert and oriented to person, place, and time.  Psychiatric:        Behavior: Behavior normal.     Comments: Anxious / frustrated        Rylie Limburg M Camreigh Michie, PA-C

## 2023-03-10 ENCOUNTER — Ambulatory Visit (INDEPENDENT_AMBULATORY_CARE_PROVIDER_SITE_OTHER)

## 2023-03-10 DIAGNOSIS — Z7901 Long term (current) use of anticoagulants: Secondary | ICD-10-CM | POA: Diagnosis not present

## 2023-03-10 LAB — POCT INR: INR: 1.9 — AB (ref 2.0–3.0)

## 2023-03-10 NOTE — Progress Notes (Signed)
 Pt is now testing INR at home. Result was 1.9 again today.  Pt reports she was concerned about her INR and also tested last Monday, 2/24, and her INR was 2.2, but did not report it. Pt has been off of Trintellix for about 3 weeks due to cost. She is now taking it again. She has taken this about 6 months before now. This does interact with warfarin and can cause increased bleeding risk. Pt was taking Wegovy but has now stopped that also due to cost. Increase dose today to take 3 tablets and then continue 3 tablets daily except take 2 tablets on Monday, Wednesday and Friday. Recheck in 1 week, on 3/10 per pt request. Contacted pt and advised of dosing and retest date.

## 2023-03-10 NOTE — Patient Instructions (Addendum)
 Pre visit review using our clinic review tool, if applicable. No additional management support is needed unless otherwise documented below in the visit note.  Increase dose today to take 3 tablets and then continue 3 tablets daily except take 2 tablets on Monday, Wednesday and Friday. Recheck in 1 week, on 3/10

## 2023-03-17 ENCOUNTER — Ambulatory Visit (INDEPENDENT_AMBULATORY_CARE_PROVIDER_SITE_OTHER)

## 2023-03-17 DIAGNOSIS — Z7901 Long term (current) use of anticoagulants: Secondary | ICD-10-CM

## 2023-03-17 LAB — POCT INR: INR: 2.1 (ref 2.0–3.0)

## 2023-03-17 NOTE — Patient Instructions (Addendum)
 Pre visit review using our clinic review tool, if applicable. No additional management support is needed unless otherwise documented below in the visit note.  Change weekly dose to take 3 tablets daily except take 2 tablets on Monday and Friday. Recheck in 2 week, on 3/24.

## 2023-03-17 NOTE — Progress Notes (Signed)
 Pt is now testing INR at home. Result was 2.1 again today.  Pt has been subtherapeutic for the last 6 weeks and today is at the bottom of her INR range. Will make small change to weekly dosing to try to move INR closer to INR goal of 2.5. Pt agreed with increase in weekly dose. Change weekly dose to take 3 tablets daily except take 2 tablets on Monday and Friday. Recheck in 2 week, on 3/24. Contacted pt and advised of dosing and retest date.

## 2023-03-25 ENCOUNTER — Other Ambulatory Visit: Payer: Self-pay | Admitting: Allergy & Immunology

## 2023-03-26 ENCOUNTER — Other Ambulatory Visit: Payer: Self-pay | Admitting: Physician Assistant

## 2023-03-31 ENCOUNTER — Ambulatory Visit (INDEPENDENT_AMBULATORY_CARE_PROVIDER_SITE_OTHER)

## 2023-03-31 DIAGNOSIS — Z7901 Long term (current) use of anticoagulants: Secondary | ICD-10-CM | POA: Diagnosis not present

## 2023-03-31 LAB — POCT INR: INR: 2 (ref 2.0–3.0)

## 2023-03-31 NOTE — Patient Instructions (Addendum)
 Pre visit review using our clinic review tool, if applicable. No additional management support is needed unless otherwise documented below in the visit note.  Change weekly dose to take 3 tablets daily except take 2 tablets on Friday. Recheck in 2 week, on 4/7.

## 2023-03-31 NOTE — Progress Notes (Signed)
 Pt is now testing INR at home. Result was 2.0 today.  Pt has been subtherapeutic for the last 8 weeks and today is at the bottom of her INR range. A change in weekly dosing was made at last encounter but INR dropped by 0.1, even with the increased dose. Will make another small change to weekly dosing to try to move INR closer to INR goal of 2.5. Pt agreed with increase in weekly dose. Pt reports she has been eating less protein but has now stocked up on high protein foods. Increasing protein in diet and reduce INR but pt reports this is the only things that has been different in the past.  Change weekly dose to take 3 tablets daily except take 2 tablets on Friday. Recheck in 2 week, on 4/7. Contacted pt and advised of dosing and retest date.

## 2023-04-14 ENCOUNTER — Ambulatory Visit (INDEPENDENT_AMBULATORY_CARE_PROVIDER_SITE_OTHER)

## 2023-04-14 DIAGNOSIS — Z7901 Long term (current) use of anticoagulants: Secondary | ICD-10-CM

## 2023-04-14 LAB — POCT INR: INR: 1.7 — AB (ref 2.0–3.0)

## 2023-04-14 NOTE — Patient Instructions (Addendum)
 Pre visit review using our clinic review tool, if applicable. No additional management support is needed unless otherwise documented below in the visit note.  Increase dose today to take 4 tablets and then change weekly dose to take 3 tablets daily except take 3 1/2 tablets on Wednesday. Recheck in 2 week, on 4/22.

## 2023-04-14 NOTE — Progress Notes (Signed)
 Pt is now testing INR at home. Result was 1.7 today.  Pt has been subtherapeutic for the last 8 weeks or at the bottom of her INR range. A change was made to weekly dosing at last encounter but INR dropped by 0.3, even with the increased dose. Will make another change to weekly dosing to try to move INR closer to INR goal of 2.5. Pt agreed with increase in weekly dose. Pt reports she has been eating less protein  due to not liking meat products, so she has been increasing her intake of beans. She did not know these also contain a significant amount of vitamin K. She also thinks she missed a dose 3 days ago, but is not sure.  Advised pt to continue her diet including beans for the protein benefit but keep it consistent because dosing will be increased to cover for vitamin K.  Increase dose today to take 4 tablets and then change weekly dose to take 3 tablets daily except take 3 1/2 tablets on Wednesday. Recheck in 2 week, on 4/22. Contacted pt and advised of dosing and retest date. Pt verbalized understanding.

## 2023-04-20 ENCOUNTER — Other Ambulatory Visit: Payer: Self-pay | Admitting: Physician Assistant

## 2023-04-29 ENCOUNTER — Ambulatory Visit (INDEPENDENT_AMBULATORY_CARE_PROVIDER_SITE_OTHER)

## 2023-04-29 DIAGNOSIS — Z7901 Long term (current) use of anticoagulants: Secondary | ICD-10-CM | POA: Diagnosis not present

## 2023-04-29 LAB — POCT INR: INR: 2.2 (ref 2.0–3.0)

## 2023-04-29 NOTE — Patient Instructions (Addendum)
 Pre visit review using our clinic review tool, if applicable. No additional management support is needed unless otherwise documented below in the visit note.  Continue 3 tablets daily except take 3 1/2 tablets on Wednesday. Recheck in 3 week, on 5/12.

## 2023-04-29 NOTE — Progress Notes (Signed)
 Pt is now testing INR at home. Result was 2.2 today.  Continue 3 tablets daily except take 3 1/2 tablets on Wednesday. Recheck in 3 week, on 5/12. Contacted pt and advised of dosing and retest date. Pt verbalized understanding.

## 2023-05-19 ENCOUNTER — Ambulatory Visit (INDEPENDENT_AMBULATORY_CARE_PROVIDER_SITE_OTHER)

## 2023-05-19 DIAGNOSIS — Z7901 Long term (current) use of anticoagulants: Secondary | ICD-10-CM

## 2023-05-19 LAB — POCT INR: INR: 2.9 (ref 2.0–3.0)

## 2023-05-19 MED ORDER — WARFARIN SODIUM 5 MG PO TABS: ORAL_TABLET | ORAL | 1 refills | Status: DC

## 2023-05-19 NOTE — Progress Notes (Addendum)
 Pt is now testing INR at home. Result was 2.9 today.  Pt is starting Zepbound today.  Continue 3 tablets daily except take 3 1/2 tablets on Wednesday. Recheck in 4 week, on 6/9. Contacted pt and advised of dosing and retest date. Pt verbalized understanding. Advised pt if any side effects, vomiting or diarrhea, from Zepbound that last longer than 2 days contact the coumadin  clinic or test INR. Pt verbalized understanding.   Pt is compliant with warfarin management and PCP apts.  Sent in refill of warfarin to requested pharmacy.

## 2023-05-19 NOTE — Patient Instructions (Addendum)
 Pre visit review using our clinic review tool, if applicable. No additional management support is needed unless otherwise documented below in the visit note.  Continue 3 tablets daily except take 3 1/2 tablets on Wednesday. Recheck in 4 week, on 6/9.

## 2023-06-03 ENCOUNTER — Ambulatory Visit: Payer: 59 | Admitting: Physician Assistant

## 2023-06-03 VITALS — BP 118/72 | HR 75 | Temp 97.3°F | Ht 66.0 in | Wt 211.2 lb

## 2023-06-03 DIAGNOSIS — Z7901 Long term (current) use of anticoagulants: Secondary | ICD-10-CM | POA: Diagnosis not present

## 2023-06-03 DIAGNOSIS — E282 Polycystic ovarian syndrome: Secondary | ICD-10-CM

## 2023-06-03 DIAGNOSIS — L731 Pseudofolliculitis barbae: Secondary | ICD-10-CM

## 2023-06-03 DIAGNOSIS — F419 Anxiety disorder, unspecified: Secondary | ICD-10-CM | POA: Diagnosis not present

## 2023-06-03 DIAGNOSIS — F32A Depression, unspecified: Secondary | ICD-10-CM

## 2023-06-03 MED ORDER — MUPIROCIN 2 % EX OINT: 1.0000 | TOPICAL_OINTMENT | Freq: Two times a day (BID) | CUTANEOUS | 0 refills | Status: DC

## 2023-06-03 NOTE — Progress Notes (Signed)
 Patient ID: Kristen Hoover, female    DOB: Dec 01, 1985, 38 y.o.   MRN: [id number]   Assessment & Plan:  There are no diagnoses linked to this encounter.    Assessment and Plan Assessment & Plan Polycystic Ovary Syndrome (PCOS) PCOS management is ongoing with Zepbound, which is well-tolerated with mild nausea and constipation. She has lost seven pounds in two weeks. The endocrinologist at [facility] is managing PCOS, adjusting medications, and monitoring blood work, which is normal. Discontinuation of spironolactone  may be considered if Zepbound remains effective. - Continue Zepbound as prescribed - Follow up with endocrinologist for ongoing management  Depression Depression is well-managed with current medication regimen. She reports mental improvement over the past two months. Buspar was increased to 15 mg twice daily, which is beneficial. Trintellix has been resumed, contributing to mood stability. Xanax  has not been needed since transitioning off Trintellix. - Continue Buspar 15 mg twice daily - Continue Trintellix as prescribed  Anticoagulation Therapy Anticoagulation therapy is well-managed with relatively normal INR levels. She is aware of the bleeding risk associated with manipulating the ingrown hair and the potential impact of antibiotics on INR levels. - Continue current anticoagulation regimen - Monitor INR levels regularly - Avoid unnecessary manipulation of the ingrown hair to prevent bleeding  Ingrown Hair She reports a painful ingrown hair that is large and white. No infection requiring antibiotics is indicated. Advised to use heat to encourage natural opening. Mupirocin ointment is recommended to aid in softening and preventing infection. - Apply heat to the ingrown hair to encourage opening - Apply mupirocin ointment to the area - Monitor for signs of infection or spreading redness      No follow-ups on file.    Subjective:    Chief Complaint  Patient  presents with   Medical Management of Chronic Issues    Pt in office today for medication follow up; pt did follow up with endocrinology at [facility] and is now taking zepbound as prescribed per endocrinology.     HPI Discussed the use of AI scribe software for clinical note transcription with the patient, who gave verbal consent to proceed.  History of Present Illness Kristen Hoover is a 38 year old female with PCOS who presents for follow-up after starting Zepbound.  She has been on Zepbound for two weeks, resulting in a weight loss of seven pounds. She experiences mild nausea and constipation as side effects, which are much milder compared to her previous experience with Wegovy , which caused severe nausea and vomiting. She is currently taking Zepbound in pen form weekly, with insurance covering the cost, amounting to about sixty dollars a month. She discontinued Wegovy  due to the difficult approval process and severe side effects.  She continues to take baby aspirin, not in chewable form, and has resumed Trintellix, which has helped stabilize her mood. She has increased her Buspar dosage to fifteen milligrams twice a day, which she feels has been beneficial. She has not needed Xanax  since transitioning back to Trintellix. She feels stable mentally for the past two months, even before starting Zepbound. Her INR levels have been relatively normal, and she feels she has stabilized in terms of nutrition and protein intake.  She mentions having a painful ingrown hair, which is large and white-looking. She is concerned about bleeding due to her Coumadin  use.  She has a history of PCOS and has seen endocrinologists in different locations, including [address] and [place]. Her current endocrinologist at [facility] has been proactive in  managing her condition, conducting blood work, and ensuring her prescriptions are appropriate. Her blood work results are normal, and she feels reassured by the  endocrinologist's approach.     Past Medical History:  Diagnosis Date   Anxiety and depression    Clotting disorder (HCC)    Frequency of urination    GERD (gastroesophageal reflux disease)    Heart murmur    History of blood clots    History of pancreatitis    12-28-2018-  last episode 2006   Hypertension    PCOS (polycystic ovarian syndrome)    Renal calculus, right    Stroke [facility])    Urgency of urination    Wears glasses     Past Surgical History:  Procedure Laterality Date   BUBBLE STUDY  03/22/2022   Procedure: BUBBLE STUDY;  Surgeon: Lenise Quince, MD;  Location: [facility] ENDOSCOPY;  Service: Cardiovascular;;   CYSTOSCOPY WITH RETROGRADE PYELOGRAM, URETEROSCOPY AND STENT PLACEMENT Right 01/05/2019   Procedure: CYSTOSCOPY/URETEROSCOP/STENT PLACEMENT;  Surgeon: Roxane Copp, MD;  Location: [facility];  Service: Urology;  Laterality: Right;  75 MINS   CYSTOSCOPY WITH RETROGRADE PYELOGRAM, URETEROSCOPY AND STENT PLACEMENT Right 02/09/2019   Procedure: CYSTOSCOPY WITH RETROGRADE PYELOGRAM, URETEROSCOPY AND STENT PLACEMENT;  Surgeon: Roxane Copp, MD;  Location: [facility];  Service: Urology;  Laterality: Right;  1 HR   ESOPHAGOGASTRODUODENOSCOPY  2015   HOLMIUM LASER APPLICATION Right 02/09/2019   Procedure: HOLMIUM LASER APPLICATION;  Surgeon: Roxane Copp, MD;  Location: [facility];  Service: Urology;  Laterality: Right;   TEE WITHOUT CARDIOVERSION N/A 03/22/2022   Procedure: TRANSESOPHAGEAL ECHOCARDIOGRAM (TEE);  Surgeon: Lenise Quince, MD;  Location: [facility] ENDOSCOPY;  Service: Cardiovascular;  Laterality: N/A;   TONSILLECTOMY  2014   WISDOM TOOTH EXTRACTION  2018    Family History  Problem Relation Age of Onset   Fibromyalgia Mother    Headache Mother    Neuropathy Father    Diabetes Father    Heart failure Father    Diabetes Brother    Breast cancer Maternal Grandmother    Colon cancer Paternal Grandfather     Liver disease Neg Hx    Esophageal cancer Neg Hx    Colon polyps Neg Hx    Rectal cancer Neg Hx    Stomach cancer Neg Hx     Social History   Tobacco Use   Smoking status: Never   Smokeless tobacco: Never  Vaping Use   Vaping status: Never Used  Substance Use Topics   Alcohol use: Yes    Comment: Rare   Drug use: Not Currently    Types: Marijuana    Comment: 12-28-2018  per pt last used 5 wks ago     No Known Allergies  Review of Systems NEGATIVE UNLESS OTHERWISE INDICATED IN HPI      Objective:     BP 118/72 (BP Location: Right Arm, Patient Position: Sitting, Cuff Size: Normal)   Pulse 75   Temp (!) 97.3 F (36.3 C) (Temporal)   Ht 5\' 6"  (1.676 m)   Wt 211 lb 3.2 oz (95.8 kg)   SpO2 99%   BMI 34.09 kg/m   Wt Readings from Last 3 Encounters:  06/03/23 211 lb 3.2 oz (95.8 kg)  03/04/23 211 lb 6.4 oz (95.9 kg)  01/21/23 204 lb 6.4 oz (92.7 kg)    BP Readings from Last 3 Encounters:  06/03/23 118/72  03/04/23 120/74  01/21/23 122/74  Physical Exam Vitals and nursing note reviewed.  Constitutional:      General: She is not in acute distress.    Appearance: Normal appearance. She is not ill-appearing.  HENT:     Head: Normocephalic.     Right Ear: External ear normal.     Left Ear: External ear normal.  Eyes:     Extraocular Movements: Extraocular movements intact.     Conjunctiva/sclera: Conjunctivae normal.     Pupils: Pupils are equal, round, and reactive to light.  Cardiovascular:     Rate and Rhythm: Normal rate and regular rhythm.     Pulses: Normal pulses.     Heart sounds: Normal heart sounds. No murmur heard. Pulmonary:     Effort: Pulmonary effort is normal. No respiratory distress.     Breath sounds: Normal breath sounds. No wheezing.  Musculoskeletal:     Cervical back: Normal range of motion.  Skin:    General: Skin is warm.     Findings: Lesion (left pubic region small pustule present) present.  Neurological:     Mental  Status: She is alert and oriented to person, place, and time.  Psychiatric:        Mood and Affect: Mood normal.        Behavior: Behavior normal.             Jese Comella M Jalena Vanderlinden, PA-C

## 2023-06-12 NOTE — Progress Notes (Unsigned)
 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  Based on small size, surgical intervention not indicated.      Secondary stroke prevention: Warfarin Atorvastatin  40mg  daily.  LDL goal less than 70.   Normotensive blood pressure Hgb A1c goal less than 7.   Follow up in 6 months.        Subjective:  Kristen Hoover is a 38 year old right-handed female with PCOS, depression, and history of kidney stone who follows up for stroke.  Recent hospital records reviewed.  UPDATE: No headaches or seeing floaters.    Current NSAIDS/analgesics:  ASA 81mg  daily, Tylenol  Current muscle relaxants:  Zofran  Current Antihypertensive medications:  none Current Antidepressant medications:  fluoxetine 40mg  daily Current Anticonvulsant medications:  none Current Antihistamines/Decongestants:  meclizine  (not using it) Birth control:  none Other medications:  Warfarin, atorvastatin  40mg  daily, Buspar 15mg  BID   HISTORY: She began experiencing a persistent daily headache in early December 2023.  At that time, she was experiencing increased depression and anxiety as it was the first holiday since her brother passed away.  She describes a sharp pressure pain behind the left eye, left eyebrow tender to touch. Tylenol  helps take the edge off. She also had a couple of episodes of horizontal double vision lasting a few minutes.  She started seeing floaters in her vision.  More recently she sees "an amber shooting star" in her vision.  She saw ophthalmology and was prescribed prism  glasses.  She was prescribed a prednisone taper at that time which helped.  However, she started having episodes of vertigo, spinning  sensation lasting a couple of minutes associated with ringing in the left ear.   She had an MRI of the brain with and without contrast on 02/12/2022 which was personally reviewed and revealed small subacute infarcts within the right thalamus and right centrum semiovale as well as tiny remote infarct within the left cerebellar hemisphere.  In hindsight, she notes that her left pinky finger feels numb and that her left cheek feels slightly different to touch.  No slurred speech or unilateral weakness.  She was advised to start ASA 81mg  daily.  Hypercoagulable panel on 2/9 revealed Beta 2 glycoprotein IgG 77 and anticardiolipin antibody of 40 but otherwise WNL.  Started on warfarin.  Hgb A1c 5.0.  LDL was 149.  Started atorvastatin  40mg  daily.  CTA head and neck on 2/14 was normal.  2D echo on 3/11 revealed EF 60-65% with no valvular disease and negative bubble study.  She saw cardiology  TEE on 3/15 revealed EF 55-60% but agitated saline contrast bubble study was positive suggestive of interatrial shunt.  As plan is for lifelong anticoagulation, PFO closure felt not to be indicated.      In July 2024, patient was in [facility], [place] when she developed left arm numbness and tingling that lasted 8 hours.  She presented to [facility].  She endorsed that she was leaning on her elbow on the table and noticed the numbness and tingling in the left arm and involving the last 2 digits of her left hand when she stood up.  Admitted for stroke workup.  INR was therapeutic.  MRI of brain revealed no acute stroke.  CTA head and neck negative  for LVO or hemodynamically significant stenosis.  LDL 56.  Hgb A1c 5.8.  Numbness appeared to be in the ulnar distribution and she was ultimately diagnosed with ulnar neuropathy.  Symptoms improved in the hospital.  No recurrence.  No longer leans on her elbows.   She denies prior history of migraines or other headaches.  Denies family history of stroke at early age.  She does not  have stroke risk factors such as heart disease, diabetes, hypertension or cigarette smoking.  However, she has slight hyperlipidemia and prior history of smoking cannabis.      PAST MEDICAL HISTORY: Past Medical History:  Diagnosis Date   Anxiety and depression    Clotting disorder (HCC)    Frequency of urination    GERD (gastroesophageal reflux disease)    Heart murmur    History of blood clots    History of pancreatitis    12-28-2018-  last episode 2006   Hypertension    PCOS (polycystic ovarian syndrome)    Renal calculus, right    Stroke [facility])    Urgency of urination    Wears glasses     MEDICATIONS: Current Outpatient Medications on File Prior to Visit  Medication Sig Dispense Refill   ALPRAZolam  (XANAX ) 0.5 MG tablet Take 1/2 to 1 tab po BID prn for acute panic. 30 tablet 2   aspirin 81 MG chewable tablet Chew 81 mg by mouth daily. (Patient not taking: Reported on 06/03/2023)     atorvastatin  (LIPITOR) 40 MG tablet Take 1 tablet (40 mg total) by mouth daily. 90 tablet 3   busPIRone (BUSPAR) 10 MG tablet Take 10 mg by mouth 2 (two) times daily.     calcium  carbonate (TUMS - DOSED IN MG ELEMENTAL CALCIUM ) 500 MG chewable tablet Chew 2 tablets by mouth daily as needed for indigestion or heartburn.     docusate sodium  (COLACE) 100 MG capsule Take 2 capsules (200 mg total) by mouth daily. 30 capsule 0   folic acid (FOLVITE) 1 MG tablet Take 1 mg by mouth daily.     levocetirizine (XYZAL ) 5 MG tablet TAKE 1 TABLET (5 MG TOTAL) BY MOUTH IN THE MORNING AND AT BEDTIME 180 tablet 1   metFORMIN  (GLUCOPHAGE -XR) 750 MG 24 hr tablet Take 1 tablet (750 mg total) by mouth 2 (two) times daily. 180 tablet 1   mupirocin  ointment (BACTROBAN ) 2 % Apply 1 Application topically 2 (two) times daily. 22 g 0   OMEGA-3-ACID ETHYL ESTERS PO Take 650 mg by mouth daily.     ondansetron  (ZOFRAN ) 4 MG tablet TAKE 1 TABLET BY MOUTH EVERY 8 HOURS AS NEEDED FOR NAUSEA AND VOMITING 30 tablet 2   ondansetron   (ZOFRAN -ODT) 4 MG disintegrating tablet TAKE 1 TABLET BY MOUTH EVERY 8 HOURS AS NEEDED FOR NAUSEA AND VOMITING 20 tablet 0   spironolactone  (ALDACTONE ) 50 MG tablet TAKE 1 TABLET BY MOUTH EVERY DAY 90 tablet 3   tirzepatide (ZEPBOUND) 2.5 MG/0.5ML Pen Inject 2.5 mg into the skin once a week.     TRINTELLIX 5 MG TABS tablet Take 5 mg by mouth daily.     Turmeric (QC TUMERIC COMPLEX) 500 MG CAPS Take 500 mg by mouth daily.     warfarin (COUMADIN ) 5 MG tablet TAKE 3 TABLETS BY MOUTH DAILY EXCEPT 3 1/2 TABLETS ON WEDNESDAYS OR AS DIRECTED BY ANTICOAGULATION CLINIC 225 tablet 1   Current Facility-Administered Medications on File Prior to Visit  Medication Dose Route Frequency Provider Last Rate Last Admin  0.9 %  sodium chloride  infusion  500 mL Intravenous Once Lajuan Pila, MD        ALLERGIES: No Known Allergies  FAMILY HISTORY: Family History  Problem Relation Age of Onset   Fibromyalgia Mother    Headache Mother    Neuropathy Father    Diabetes Father    Heart failure Father    Diabetes Brother    Breast cancer Maternal Grandmother    Colon cancer Paternal Grandfather    Liver disease Neg Hx    Esophageal cancer Neg Hx    Colon polyps Neg Hx    Rectal cancer Neg Hx    Stomach cancer Neg Hx       Objective:  *** General: No acute distress.  Patient appears well-groomed.   Head:  Normocephalic/atraumatic Neck:  Supple.  No paraspinal tenderness.  Full range of motion. Heart:  Regular rate and rhythm. Neuro:  Alert and oriented.  Speech fluent and not dysarthric.  Language intact.  CN II-XII intact.  Bulk and tone normal.  Muscle strength 5/5 throughout.  Sensation to light touch intact.  Deep tendon reflexes 2+ throughout, toes downgoing.  Gait normal.  Romberg negative.     Janne Members, DO  CC: Alyssa Allwardt, PA-C

## 2023-06-13 ENCOUNTER — Ambulatory Visit: Payer: 59 | Admitting: Neurology

## 2023-06-13 ENCOUNTER — Encounter: Payer: Self-pay | Admitting: Neurology

## 2023-06-13 VITALS — BP 124/80 | HR 92 | Wt 207.0 lb

## 2023-06-13 DIAGNOSIS — Q2112 Patent foramen ovale: Secondary | ICD-10-CM | POA: Diagnosis not present

## 2023-06-13 DIAGNOSIS — I639 Cerebral infarction, unspecified: Secondary | ICD-10-CM

## 2023-06-13 DIAGNOSIS — R760 Raised antibody titer: Secondary | ICD-10-CM

## 2023-06-16 ENCOUNTER — Ambulatory Visit (INDEPENDENT_AMBULATORY_CARE_PROVIDER_SITE_OTHER)

## 2023-06-16 DIAGNOSIS — Z7901 Long term (current) use of anticoagulants: Secondary | ICD-10-CM

## 2023-06-16 LAB — POCT INR: INR: 3.4 — AB (ref 2.0–3.0)

## 2023-06-16 NOTE — Patient Instructions (Addendum)
 Pre visit review using our clinic review tool, if applicable. No additional management support is needed unless otherwise documented below in the visit note.  Reduce dose today to take 1 tablet and then change weekly dose to take 3 tablets daily. Recheck in 2 week, on 6/23.

## 2023-06-16 NOTE — Progress Notes (Signed)
 Pt is now testing INR at home. Result was 3.4 today.  Pt started Zepbound on 5/12. This can delay processing of warfarin due to slowing GI motility. Pt also reports she has been very fatigued since starting Zepbound. This can be a side effect. Advised to talk to prescribing about side effect. Pt also reports she was visiting friends on the [employer] last week and did miss some of her daily medications but never missed warfarin. She also reports she took a very small piece of Adderall her friend gave her while she was out there due to her fatigue. This will interact with warfarin and increase INR. Reduce dose today to take 1 tablet and then change weekly dose to take 3 tablets daily. Recheck in 2 week, on 6/23. Contacted pt and advised of dosing and retest date. Pt verbalized understanding.

## 2023-06-30 ENCOUNTER — Ambulatory Visit (INDEPENDENT_AMBULATORY_CARE_PROVIDER_SITE_OTHER)

## 2023-06-30 DIAGNOSIS — Z7901 Long term (current) use of anticoagulants: Secondary | ICD-10-CM

## 2023-06-30 LAB — POCT INR: INR: 4.1 — AB (ref 2.0–3.0)

## 2023-06-30 NOTE — Patient Instructions (Addendum)
 Pre visit review using our clinic review tool, if applicable. No additional management support is needed unless otherwise documented below in the visit note.  Hold dose today and then change weekly dose to take 3 tablets daily except take 2 tablets on Monday, Wednesday and Friday. Recheck in 1 week, on 6/30.

## 2023-06-30 NOTE — Progress Notes (Signed)
 Pt is now testing INR at home. Result was 4.1 today.  Pt started Zepbound on 5/12. This can delay processing of warfarin due to slowing GI motility. Pt reports it is also difficult to regulate her diet due to the Zepbound. She is often not hungry and struggles to get enough proteins and water.  Hold dose today and then change weekly dose to take 3 tablets daily except take 2 tablets on Monday, Wednesday and Friday. Recheck in 1 week, on 6/30. Contacted pt and advised of dosing and retest date. Pt verbalized understanding.

## 2023-07-07 ENCOUNTER — Ambulatory Visit (INDEPENDENT_AMBULATORY_CARE_PROVIDER_SITE_OTHER)

## 2023-07-07 DIAGNOSIS — Z7901 Long term (current) use of anticoagulants: Secondary | ICD-10-CM | POA: Diagnosis not present

## 2023-07-07 LAB — POCT INR: INR: 2.5 (ref 2.0–3.0)

## 2023-07-07 NOTE — Patient Instructions (Signed)
Pre visit review using our clinic review tool, if applicable. No additional management support is needed unless otherwise documented below in the visit note. 

## 2023-07-07 NOTE — Progress Notes (Signed)
 Pt is now testing INR at home. Result was 2.5 today.   Continue 3 tablets daily except take 2 tablets on Monday, Wednesday and Friday. Recheck in 2 week, on 7/14. Contacted pt and advised of dosing and retest date. Pt verbalized understanding.

## 2023-07-15 ENCOUNTER — Other Ambulatory Visit: Payer: Self-pay | Admitting: Physician Assistant

## 2023-07-15 DIAGNOSIS — R760 Raised antibody titer: Secondary | ICD-10-CM

## 2023-07-16 ENCOUNTER — Inpatient Hospital Stay: Payer: 59 | Attending: Physician Assistant | Admitting: Physician Assistant

## 2023-07-16 ENCOUNTER — Ambulatory Visit: Payer: Self-pay | Admitting: Physician Assistant

## 2023-07-16 ENCOUNTER — Inpatient Hospital Stay: Payer: 59

## 2023-07-16 VITALS — BP 107/68 | HR 81 | Temp 97.0°F | Resp 13 | Wt 205.0 lb

## 2023-07-16 DIAGNOSIS — R760 Raised antibody titer: Secondary | ICD-10-CM

## 2023-07-16 DIAGNOSIS — Z79899 Other long term (current) drug therapy: Secondary | ICD-10-CM | POA: Insufficient documentation

## 2023-07-16 DIAGNOSIS — Z800 Family history of malignant neoplasm of digestive organs: Secondary | ICD-10-CM | POA: Diagnosis not present

## 2023-07-16 DIAGNOSIS — D6861 Antiphospholipid syndrome: Secondary | ICD-10-CM | POA: Diagnosis present

## 2023-07-16 DIAGNOSIS — Z8673 Personal history of transient ischemic attack (TIA), and cerebral infarction without residual deficits: Secondary | ICD-10-CM | POA: Insufficient documentation

## 2023-07-16 DIAGNOSIS — R718 Other abnormality of red blood cells: Secondary | ICD-10-CM | POA: Diagnosis not present

## 2023-07-16 DIAGNOSIS — Z7901 Long term (current) use of anticoagulants: Secondary | ICD-10-CM | POA: Insufficient documentation

## 2023-07-16 DIAGNOSIS — Z803 Family history of malignant neoplasm of breast: Secondary | ICD-10-CM | POA: Diagnosis not present

## 2023-07-16 LAB — CBC WITH DIFFERENTIAL (CANCER CENTER ONLY)
Abs Immature Granulocytes: 0.02 K/uL (ref 0.00–0.07)
Basophils Absolute: 0.1 K/uL (ref 0.0–0.1)
Basophils Relative: 1 %
Eosinophils Absolute: 0.1 K/uL (ref 0.0–0.5)
Eosinophils Relative: 1 %
HCT: 36.4 % (ref 36.0–46.0)
Hemoglobin: 13 g/dL (ref 12.0–15.0)
Immature Granulocytes: 0 %
Lymphocytes Relative: 30 %
Lymphs Abs: 2.7 K/uL (ref 0.7–4.0)
MCH: 27.5 pg (ref 26.0–34.0)
MCHC: 35.7 g/dL (ref 30.0–36.0)
MCV: 77.1 fL — ABNORMAL LOW (ref 80.0–100.0)
Monocytes Absolute: 0.6 K/uL (ref 0.1–1.0)
Monocytes Relative: 7 %
Neutro Abs: 5.3 K/uL (ref 1.7–7.7)
Neutrophils Relative %: 61 %
Platelet Count: 226 K/uL (ref 150–400)
RBC: 4.72 MIL/uL (ref 3.87–5.11)
RDW: 14.1 % (ref 11.5–15.5)
WBC Count: 8.8 K/uL (ref 4.0–10.5)
nRBC: 0 % (ref 0.0–0.2)

## 2023-07-16 LAB — CMP (CANCER CENTER ONLY)
ALT: 17 U/L (ref 0–44)
AST: 15 U/L (ref 15–41)
Albumin: 4.2 g/dL (ref 3.5–5.0)
Alkaline Phosphatase: 91 U/L (ref 38–126)
Anion gap: 7 (ref 5–15)
BUN: 13 mg/dL (ref 6–20)
CO2: 27 mmol/L (ref 22–32)
Calcium: 9.4 mg/dL (ref 8.9–10.3)
Chloride: 105 mmol/L (ref 98–111)
Creatinine: 0.84 mg/dL (ref 0.44–1.00)
GFR, Estimated: >60 mL/min (ref >60)
Glucose, Bld: 94 mg/dL (ref 70–99)
Potassium: 4 mmol/L (ref 3.5–5.1)
Sodium: 139 mmol/L (ref 135–145)
Total Bilirubin: 0.7 mg/dL (ref 0.0–1.2)
Total Protein: 7.4 g/dL (ref 6.5–8.1)

## 2023-07-16 LAB — PROTIME-INR
INR: 1.7 — ABNORMAL HIGH (ref 0.8–1.2)
Prothrombin Time: 20.9 s — ABNORMAL HIGH (ref 11.4–15.2)

## 2023-07-16 NOTE — Progress Notes (Signed)
 [facility] [phone]) [phone]   [phone]  PROGRESS NOTE  Patient Care Team: Allwardt, Mardy HERO, PA-C as PCP - General (Physician Assistant) Skeet Juliene SAUNDERS, DO as Consulting Physician (Neurology) Iva Marty Saltness, MD as Consulting Physician (Allergy  and Immunology) Ethyl Kirsch, RN as Registered Nurse  Hematological/Oncological History # Elevated APS Antibodies in Setting of CVA 02/12/2022: MR Brain showed linear focus of restricted diffusion in the medial right thalamus concerning for subacute infarct 02/15/2022: Hypercoagulation panel showed Beta 2 glycoprotein IgG of 77, Anticardiolipin of 40. All other were WNL.  04/10/2022: establish care with Dr. Federico   Interval History:  Kristen Hoover 38 y.o. 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 The patient's last visit was on 01/15/2023. In the interim, she has been taking Coumadin  within her INR goal range.  On exam today Kristen Hoover reports she has been well overall with interim since her last visit. She is tolerating her coumadin  treatment. She noticed when her INR was above therapeutic range there was evidence of gum bleeding and more bruising on her thighs. She has noticed her menstrual cycles with spotting lasting 7 days and then her cycle begins. She denies nausea, vomiting or bowel habit changes. She is currently on GLP-1 agent so her diet and appetite have changed. She denies fevers, chills, sweats, shortness of breath, chest pain or cough. She has no other complaints. Rest of the ROS is below.   MEDICAL HISTORY:  Past Medical History:  Diagnosis Date   Anxiety and depression    Clotting disorder (HCC)    Frequency of urination    GERD (gastroesophageal reflux disease)    Heart murmur    History of blood clots    History of pancreatitis    12-28-2018-  last episode 2006   Hypertension    PCOS (polycystic  ovarian syndrome)    Renal calculus, right    Stroke [facility])    Urgency of urination    Wears glasses     SURGICAL HISTORY: Past Surgical History:  Procedure Laterality Date   BUBBLE STUDY  03/22/2022   Procedure: BUBBLE STUDY;  Surgeon: Pietro Redell RAMAN, MD;  Location: [facility] ENDOSCOPY;  Service: Cardiovascular;;   CYSTOSCOPY WITH RETROGRADE PYELOGRAM, URETEROSCOPY AND STENT PLACEMENT Right 01/05/2019   Procedure: CYSTOSCOPY/URETEROSCOP/STENT PLACEMENT;  Surgeon: Elisabeth Valli BIRCH, MD;  Location: [facility];  Service: Urology;  Laterality: Right;  75 MINS   CYSTOSCOPY WITH RETROGRADE PYELOGRAM, URETEROSCOPY AND STENT PLACEMENT Right 02/09/2019   Procedure: CYSTOSCOPY WITH RETROGRADE PYELOGRAM, URETEROSCOPY AND STENT PLACEMENT;  Surgeon: Elisabeth Valli BIRCH, MD;  Location: [facility];  Service: Urology;  Laterality: Right;  1 HR   ESOPHAGOGASTRODUODENOSCOPY  2015   HOLMIUM LASER APPLICATION Right 02/09/2019   Procedure: HOLMIUM LASER APPLICATION;  Surgeon: Elisabeth Valli BIRCH, MD;  Location: [facility];  Service: Urology;  Laterality: Right;   TEE WITHOUT CARDIOVERSION N/A 03/22/2022   Procedure: TRANSESOPHAGEAL ECHOCARDIOGRAM (TEE);  Surgeon: Pietro Redell RAMAN, MD;  Location: [facility] ENDOSCOPY;  Service: Cardiovascular;  Laterality: N/A;   TONSILLECTOMY  2014   WISDOM TOOTH EXTRACTION  2018    SOCIAL HISTORY: Social History   Socioeconomic History   Marital status: Married    Spouse name: Not on file   Number of children: 0   Years of education: Not on file   Highest education level: Bachelor's degree (e.g., BA, AB, BS)  Occupational  History   Occupation: Engineer, agricultural  Tobacco Use   Smoking status: Never   Smokeless tobacco: Never  Vaping Use   Vaping status: Never Used  Substance and Sexual Activity   Alcohol use: Yes    Comment: Rare   Drug use: Not Currently    Types: Marijuana    Comment: 12-28-2018  per pt last used 5 wks ago    Sexual activity: Not on file    Comment: husband had vasectomy  Other Topics Concern   Not on file  Social History Narrative   Are you right handed or left handed? Right    Are you currently employed ?    What is your current occupation?   Do you live at home alone?   Who lives with you?    What type of home do you live in: 1 story or 2 story? 2       Social Drivers of Corporate investment banker Strain: Low Risk  (05/12/2023)   Received from [facility]   Overall Financial Resource Strain (CARDIA)    Difficulty of Paying Living Expenses: Not hard at all  Food Insecurity: No Food Insecurity (05/12/2023)   Received from [facility]   Hunger Vital Sign    Within the past 12 months, you worried that your food would run out before you got the money to buy more.: Never true    Within the past 12 months, the food you bought just didn't last and you didn't have money to get more.: Never true  Transportation Needs: No Transportation Needs (05/12/2023)   Received from [facility] - Transportation    In the past 12 months, has lack of transportation kept you from medical appointments or from getting medications?: No    Lack of Transportation (Non-Medical): No  Physical Activity: Unknown (03/04/2023)   Exercise Vital Sign    Days of Exercise per Week: Patient declined    Minutes of Exercise per Session: Not on file  Stress: Stress Concern Present (03/04/2023)   [employer] of Occupational Health - Occupational Stress Questionnaire    Feeling of Stress : Very much  Social Connections: Unknown (03/04/2023)   Social Connection and Isolation Panel    Frequency of Communication with Friends and Family: Twice a week    Frequency of Social Gatherings with Friends and Family: Once a week    Attends Religious Services: Patient declined    Database administrator or Organizations: No    Attends Engineer, structural: Not on file     Marital Status: Married  Intimate Partner Violence: Unknown (11/08/2022)   Received from [facility]   HITS    Physically Hurt: Not on file    Insult or Talk Down To: Not on file    Threaten Physical Harm: Not on file    Scream or Curse: Not on file    FAMILY HISTORY: Family History  Problem Relation Age of Onset   Fibromyalgia Mother    Headache Mother    Neuropathy Father    Diabetes Father    Heart failure Father    Diabetes Brother    Breast cancer Maternal Grandmother    Colon cancer Paternal Grandfather    Liver disease Neg Hx    Esophageal cancer Neg Hx    Colon polyps Neg Hx    Rectal cancer Neg Hx    Stomach cancer Neg Hx     ALLERGIES:  has no known allergies.  MEDICATIONS:  Current Outpatient Medications  Medication Sig Dispense Refill   ALPRAZolam  (XANAX ) 0.5 MG tablet Take 1/2 to 1 tab po BID prn for acute panic. 30 tablet 2   aspirin 81 MG chewable tablet Chew 81 mg by mouth daily.     atorvastatin  (LIPITOR) 40 MG tablet Take 1 tablet (40 mg total) by mouth daily. 90 tablet 3   busPIRone (BUSPAR) 10 MG tablet Take 10 mg by mouth 2 (two) times daily.     calcium  carbonate (TUMS - DOSED IN MG ELEMENTAL CALCIUM ) 500 MG chewable tablet Chew 2 tablets by mouth daily as needed for indigestion or heartburn.     docusate sodium  (COLACE) 100 MG capsule Take 2 capsules (200 mg total) by mouth daily. 30 capsule 0   folic acid (FOLVITE) 1 MG tablet Take 1 mg by mouth daily.     levocetirizine (XYZAL ) 5 MG tablet TAKE 1 TABLET (5 MG TOTAL) BY MOUTH IN THE MORNING AND AT BEDTIME 180 tablet 1   metFORMIN  (GLUCOPHAGE -XR) 750 MG 24 hr tablet Take 1 tablet (750 mg total) by mouth 2 (two) times daily. 180 tablet 1   OMEGA-3-ACID ETHYL ESTERS PO Take 650 mg by mouth daily.     ondansetron  (ZOFRAN ) 4 MG tablet TAKE 1 TABLET BY MOUTH EVERY 8 HOURS AS NEEDED FOR NAUSEA AND VOMITING 30 tablet 2   ondansetron  (ZOFRAN -ODT) 4 MG disintegrating tablet TAKE 1 TABLET BY MOUTH EVERY 8  HOURS AS NEEDED FOR NAUSEA AND VOMITING 20 tablet 0   spironolactone  (ALDACTONE ) 50 MG tablet TAKE 1 TABLET BY MOUTH EVERY DAY 90 tablet 3   tirzepatide (ZEPBOUND) 2.5 MG/0.5ML Pen Inject 2.5 mg into the skin once a week.     TRINTELLIX 5 MG TABS tablet Take 5 mg by mouth daily.     Turmeric (QC TUMERIC COMPLEX) 500 MG CAPS Take 500 mg by mouth daily.     warfarin (COUMADIN ) 5 MG tablet TAKE 3 TABLETS BY MOUTH DAILY EXCEPT 3 1/2 TABLETS ON WEDNESDAYS OR AS DIRECTED BY ANTICOAGULATION CLINIC 225 tablet 1   mupirocin  ointment (BACTROBAN ) 2 % Apply 1 Application topically 2 (two) times daily. (Patient not taking: Reported on 07/16/2023) 22 g 0   Current Facility-Administered Medications  Medication Dose Route Frequency Provider Last Rate Last Admin   0.9 %  sodium chloride  infusion  500 mL Intravenous Once Charlanne Groom, MD        REVIEW OF SYSTEMS:   Constitutional: ( - ) fevers, ( - )  chills , ( - ) night sweats Eyes: ( - ) blurriness of vision, ( - ) double vision, ( - ) watery eyes Ears, nose, mouth, throat, and face: ( - ) mucositis, ( - ) sore throat Respiratory: ( - ) cough, ( - ) dyspnea, ( - ) wheezes Cardiovascular: ( - ) palpitation, ( - ) chest discomfort, ( - ) lower extremity swelling Gastrointestinal:  ( - ) nausea, ( - ) heartburn, ( - ) change in bowel habits Skin: ( - ) abnormal skin rashes Lymphatics: ( - ) new lymphadenopathy, ( - ) easy bruising Neurological: ( - ) numbness, ( - ) tingling, ( - ) new weaknesses Behavioral/Psych: ( - ) mood change, ( - ) new changes  All other systems were reviewed with the patient and are negative.  PHYSICAL EXAMINATION:  Vitals:   07/16/23 0815  BP: 107/68  Pulse: 81  Resp: 13  Temp: (!) 97 F (36.1  C)  SpO2: 97%    Filed Weights   07/16/23 0815  Weight: 205 lb (93 kg)     GENERAL: Well-appearing middle-aged Caucasian female, alert, no distress and comfortable SKIN: skin color, texture, turgor are normal, no rashes or  significant lesions EYES: conjunctiva are pink and non-injected, sclera clear LUNGS: clear to auscultation and percussion with normal breathing effort HEART: regular rate & rhythm and no murmurs and no lower extremity edema Musculoskeletal: no cyanosis of digits and no clubbing  PSYCH: alert & oriented x 3, fluent speech NEURO: no focal motor/sensory deficits  LABORATORY DATA:  I have reviewed the data as listed    Latest Ref Rng & Units 07/16/2023    7:55 AM 01/15/2023    8:39 AM 08/14/2022   10:18 AM  CBC  WBC 4.0 - 10.5 K/uL 8.8  10.1  11.4   Hemoglobin 12.0 - 15.0 g/dL 86.9  86.1  86.6   Hematocrit 36.0 - 46.0 % 36.4  38.7  39.9   Platelets 150 - 400 K/uL 226  257  253.0        Latest Ref Rng & Units 01/15/2023    8:39 AM 07/10/2022    8:19 AM 05/14/2022    8:56 AM  CMP  Glucose 70 - 99 mg/dL 97  94  95   BUN 6 - 20 mg/dL 14  15  14    Creatinine 0.44 - 1.00 mg/dL 9.13  9.23  9.19   Sodium 135 - 145 mmol/L 139  138  138   Potassium 3.5 - 5.1 mmol/L 4.0  4.2  4.5   Chloride 98 - 111 mmol/L 106  108  105   CO2 22 - 32 mmol/L 26  23  27    Calcium  8.9 - 10.3 mg/dL 9.4  9.2  9.2   Total Protein 6.5 - 8.1 g/dL 7.8  6.7  7.2   Total Bilirubin 0.0 - 1.2 mg/dL 0.5  0.3  0.3   Alkaline Phos 38 - 126 U/L 101  83  86   AST 15 - 41 U/L 16  14  17    ALT 0 - 44 U/L 20  20  27      RADIOGRAPHIC STUDIES: No results found.  ASSESSMENT & PLAN Kristen Hoover is a 38 y.o. 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  After review of the labs, review of the records, and discussion with the patient the patients findings are most consistent with elevated APS antibodies in the setting of a prior CVA.   Antiphospholipid antibody syndrome is a hypercoagulation and autoimmune condition that results in increased frequency of venous and arterial thrombosis. In order to meet the diagnostic criteria for APS a patient must  have one of both the clinical and laboratory criteria noted from the [place] Criteria (noted below). Laboratory criteria must be met on at least 2 occasions, separated by at least 12 weeks. Presence of antibodies in the absence of clinical criteria fails to meet diagnostic criteria and does not merit treatment or further investigation.      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  Of the two the beta-2  glycoprotein is the more clinically significant. -- Lupus anticoagulant panel was also drawn, however this appears to been drawn while on anticoagulation. Results are difficult to interpret.  --  recommend patient continue Coumadin  therapy given the fact she had an arterial thrombus with an elevated antiphospholipid antibody. -- Patient's initial testing was on 02/15/2022.  Confirmatory testing in July 2024 confirmed persistently elevated Beta 2 glycoprotein IgG.  PLAN: --Labs today show white blood cell count 8.8, hemoglobin 13.0 MCV 77.1, platelets 226 --Continue on coumadin  therapy with frequent INR checks with PCP --RTC in 12 months for repeat evaluation.    #Microcytosis: --Iron panel from January 2025 showed iron levels were low end of normal --Recommend ferrous sulfate 325 mg PO daily with a source of vitamin C.    No orders of the defined types were placed in this encounter.   All questions were answered. The patient knows to call the clinic with any problems, questions or concerns.  I have spent a total of 25 minutes minutes of face-to-face and non-face-to-face time, preparing to see the patient,performing a medically appropriate examination, counseling and educating the patient, documenting clinical information in the electronic health record, independently interpreting results and communicating results to the patient, and care coordination.   Johnston Police PA-C Dept of Hematology and [facility]cology [facility] Phone: [phone]   07/16/2023 8:44 AM

## 2023-07-17 ENCOUNTER — Ambulatory Visit (INDEPENDENT_AMBULATORY_CARE_PROVIDER_SITE_OTHER)

## 2023-07-17 DIAGNOSIS — Z7901 Long term (current) use of anticoagulants: Secondary | ICD-10-CM

## 2023-07-17 NOTE — Progress Notes (Signed)
 Pt had an apt with oncology yesterday and they drew a lab INR. Result was forwarded by provider to the coumadin  clinic to contact pt with dosing instructions.  Increase dose today to take 4 1/2 tablets and then continue 3 tablets daily except take 2 tablets on Monday, Wednesday and Friday. Recheck in 2 week, on 7/21. Contacted pt and advised of dosing and retest date. Pt verbalized understanding.

## 2023-07-17 NOTE — Patient Instructions (Addendum)
 Pre visit review using our clinic review tool, if applicable. No additional management support is needed unless otherwise documented below in the visit note.  Increase dose today to take 4 1/2 tablets and then continue 3 tablets daily except take 2 tablets on Monday, Wednesday and Friday. Recheck in 2 week, on 7/21.

## 2023-07-28 ENCOUNTER — Ambulatory Visit (INDEPENDENT_AMBULATORY_CARE_PROVIDER_SITE_OTHER)

## 2023-07-28 DIAGNOSIS — Z7901 Long term (current) use of anticoagulants: Secondary | ICD-10-CM | POA: Diagnosis not present

## 2023-07-28 LAB — POCT INR: INR: 3.8 — AB (ref 2.0–3.0)

## 2023-07-28 NOTE — Patient Instructions (Signed)
Pre visit review using our clinic review tool, if applicable. No additional management support is needed unless otherwise documented below in the visit note. 

## 2023-07-28 NOTE — Progress Notes (Signed)
 Pt reports diarrhea at least once daily over the last week due to GLP 1 she is taking. Diarrhea will increase INR. She wanes from constipation to diarrhea regularly. Advised if diarrhea continues to contact coumadin  clinic and test in one week instead of two and also contact her PCP to make her aware. Pt verbalized understanding. Pt reports she has almost complete stopped smoking and has  decreased smoking by at least 90%. Congratulated  pt on her efforts to stop smoking. Hold dose today and then change weekly dose to take 2 tablets daily except take 2 tablets on Monday, Wednesday and Friday. Recheck in 2 week, on 8/4. Contacted pt and advised of dosing and retest date. Pt verbalized understanding.

## 2023-07-29 ENCOUNTER — Other Ambulatory Visit: Payer: Self-pay | Admitting: Physician Assistant

## 2023-08-09 ENCOUNTER — Inpatient Hospital Stay (HOSPITAL_COMMUNITY)

## 2023-08-09 ENCOUNTER — Inpatient Hospital Stay (HOSPITAL_BASED_OUTPATIENT_CLINIC_OR_DEPARTMENT_OTHER)
Admission: EM | Admit: 2023-08-09 | Discharge: 2023-08-11 | DRG: 439 | Disposition: A | Discharge status: Home / Self Care | Attending: Internal Medicine | Admitting: Internal Medicine

## 2023-08-09 ENCOUNTER — Other Ambulatory Visit: Payer: Self-pay

## 2023-08-09 DIAGNOSIS — E785 Hyperlipidemia, unspecified: Secondary | ICD-10-CM | POA: Diagnosis present

## 2023-08-09 DIAGNOSIS — K219 Gastro-esophageal reflux disease without esophagitis: Secondary | ICD-10-CM | POA: Diagnosis present

## 2023-08-09 DIAGNOSIS — E282 Polycystic ovarian syndrome: Secondary | ICD-10-CM | POA: Diagnosis present

## 2023-08-09 DIAGNOSIS — E7800 Pure hypercholesterolemia, unspecified: Secondary | ICD-10-CM | POA: Diagnosis not present

## 2023-08-09 DIAGNOSIS — Z800 Family history of malignant neoplasm of digestive organs: Secondary | ICD-10-CM | POA: Diagnosis not present

## 2023-08-09 DIAGNOSIS — Z6833 Body mass index (BMI) 33.0-33.9, adult: Secondary | ICD-10-CM

## 2023-08-09 DIAGNOSIS — D72829 Elevated white blood cell count, unspecified: Secondary | ICD-10-CM | POA: Diagnosis not present

## 2023-08-09 DIAGNOSIS — Z803 Family history of malignant neoplasm of breast: Secondary | ICD-10-CM

## 2023-08-09 DIAGNOSIS — K5900 Constipation, unspecified: Secondary | ICD-10-CM | POA: Diagnosis present

## 2023-08-09 DIAGNOSIS — Z7901 Long term (current) use of anticoagulants: Secondary | ICD-10-CM | POA: Diagnosis not present

## 2023-08-09 DIAGNOSIS — D6861 Antiphospholipid syndrome: Secondary | ICD-10-CM | POA: Diagnosis present

## 2023-08-09 DIAGNOSIS — F32A Depression, unspecified: Secondary | ICD-10-CM | POA: Diagnosis present

## 2023-08-09 DIAGNOSIS — Q453 Other congenital malformations of pancreas and pancreatic duct: Secondary | ICD-10-CM

## 2023-08-09 DIAGNOSIS — Z833 Family history of diabetes mellitus: Secondary | ICD-10-CM

## 2023-08-09 DIAGNOSIS — Z7982 Long term (current) use of aspirin: Secondary | ICD-10-CM

## 2023-08-09 DIAGNOSIS — I10 Essential (primary) hypertension: Secondary | ICD-10-CM | POA: Diagnosis present

## 2023-08-09 DIAGNOSIS — Z1152 Encounter for screening for COVID-19: Secondary | ICD-10-CM

## 2023-08-09 DIAGNOSIS — Z8249 Family history of ischemic heart disease and other diseases of the circulatory system: Secondary | ICD-10-CM | POA: Diagnosis not present

## 2023-08-09 DIAGNOSIS — E66811 Obesity, class 1: Secondary | ICD-10-CM | POA: Diagnosis present

## 2023-08-09 DIAGNOSIS — Z7984 Long term (current) use of oral hypoglycemic drugs: Secondary | ICD-10-CM

## 2023-08-09 DIAGNOSIS — K649 Unspecified hemorrhoids: Secondary | ICD-10-CM | POA: Diagnosis present

## 2023-08-09 DIAGNOSIS — K858 Other acute pancreatitis without necrosis or infection: Secondary | ICD-10-CM | POA: Diagnosis present

## 2023-08-09 DIAGNOSIS — Z8673 Personal history of transient ischemic attack (TIA), and cerebral infarction without residual deficits: Secondary | ICD-10-CM | POA: Diagnosis not present

## 2023-08-09 DIAGNOSIS — D72823 Leukemoid reaction: Secondary | ICD-10-CM | POA: Diagnosis not present

## 2023-08-09 DIAGNOSIS — K859 Acute pancreatitis without necrosis or infection, unspecified: Principal | ICD-10-CM | POA: Diagnosis present

## 2023-08-09 DIAGNOSIS — Z79899 Other long term (current) drug therapy: Secondary | ICD-10-CM

## 2023-08-09 DIAGNOSIS — K853 Drug induced acute pancreatitis without necrosis or infection: Secondary | ICD-10-CM | POA: Diagnosis not present

## 2023-08-09 DIAGNOSIS — F419 Anxiety disorder, unspecified: Secondary | ICD-10-CM | POA: Diagnosis present

## 2023-08-09 DIAGNOSIS — Z86718 Personal history of other venous thrombosis and embolism: Secondary | ICD-10-CM

## 2023-08-09 LAB — URINALYSIS, ROUTINE W REFLEX MICROSCOPIC
Bilirubin Urine: NEGATIVE
Glucose, UA: NEGATIVE mg/dL
Ketones, ur: 20 mg/dL — AB
Leukocytes,Ua: NEGATIVE
Nitrite: NEGATIVE
Protein, ur: NEGATIVE mg/dL
Specific Gravity, Urine: 1.026 (ref 1.005–1.030)
pH: 5 (ref 5.0–8.0)

## 2023-08-09 LAB — CBC WITH DIFFERENTIAL/PLATELET
Abs Immature Granulocytes: 0.03 K/uL (ref 0.00–0.07)
Basophils Absolute: 0 K/uL (ref 0.0–0.1)
Basophils Relative: 0 %
Eosinophils Absolute: 0.2 K/uL (ref 0.0–0.5)
Eosinophils Relative: 2 %
HCT: 38.1 % (ref 36.0–46.0)
Hemoglobin: 13.5 g/dL (ref 12.0–15.0)
Immature Granulocytes: 0 %
Lymphocytes Relative: 15 %
Lymphs Abs: 1.7 K/uL (ref 0.7–4.0)
MCH: 28 pg (ref 26.0–34.0)
MCHC: 35.4 g/dL (ref 30.0–36.0)
MCV: 78.9 fL — ABNORMAL LOW (ref 80.0–100.0)
Monocytes Absolute: 0.5 K/uL (ref 0.1–1.0)
Monocytes Relative: 5 %
Neutro Abs: 8.8 K/uL — ABNORMAL HIGH (ref 1.7–7.7)
Neutrophils Relative %: 78 %
Platelets: 228 K/uL (ref 150–400)
RBC: 4.83 MIL/uL (ref 3.87–5.11)
RDW: 14.5 % (ref 11.5–15.5)
WBC: 11.3 K/uL — ABNORMAL HIGH (ref 4.0–10.5)
nRBC: 0 % (ref 0.0–0.2)

## 2023-08-09 LAB — COMPREHENSIVE METABOLIC PANEL WITH GFR
ALT: 18 U/L (ref 0–44)
AST: 20 U/L (ref 15–41)
Albumin: 4.3 g/dL (ref 3.5–5.0)
Alkaline Phosphatase: 101 U/L (ref 38–126)
Anion gap: 12 (ref 5–15)
BUN: 12 mg/dL (ref 6–20)
CO2: 24 mmol/L (ref 22–32)
Calcium: 9.9 mg/dL (ref 8.9–10.3)
Chloride: 104 mmol/L (ref 98–111)
Creatinine, Ser: 0.83 mg/dL (ref 0.44–1.00)
GFR, Estimated: >60 mL/min (ref >60)
Glucose, Bld: 97 mg/dL (ref 70–99)
Potassium: 3.6 mmol/L (ref 3.5–5.1)
Sodium: 140 mmol/L (ref 135–145)
Total Bilirubin: 0.8 mg/dL (ref 0.0–1.2)
Total Protein: 7.5 g/dL (ref 6.5–8.1)

## 2023-08-09 LAB — HIV ANTIBODY (ROUTINE TESTING W REFLEX): HIV Screen 4th Generation wRfx: NONREACTIVE

## 2023-08-09 LAB — LIPASE, BLOOD: Lipase: 1764 U/L — ABNORMAL HIGH (ref 11–51)

## 2023-08-09 LAB — PROTIME-INR
INR: 2.5 — ABNORMAL HIGH (ref 0.8–1.2)
Prothrombin Time: 28.4 s — ABNORMAL HIGH (ref 11.4–15.2)

## 2023-08-09 MED ORDER — ACETAMINOPHEN 650 MG RE SUPP: 650.0000 mg | Freq: Four times a day (QID) | RECTAL | Status: DC | PRN

## 2023-08-09 MED ORDER — ALPRAZOLAM 0.5 MG PO TABS: 0.5000 mg | ORAL_TABLET | Freq: Two times a day (BID) | ORAL | Status: DC | PRN

## 2023-08-09 MED ORDER — CALCIUM CARBONATE ANTACID 500 MG PO CHEW: 2.0000 | CHEWABLE_TABLET | Freq: Every day | ORAL | Status: DC | PRN

## 2023-08-09 MED ORDER — DIAZEPAM 5 MG/ML IJ SOLN: 2.5000 mg | Freq: Once | INTRAMUSCULAR | Status: DC | PRN

## 2023-08-09 MED ORDER — SODIUM CHLORIDE 0.9 % IV BOLUS
1000.0000 mL | Freq: Once | INTRAVENOUS | Status: AC
  Administered 2023-08-09: 1000 mL via INTRAVENOUS

## 2023-08-09 MED ORDER — BUSPIRONE HCL 5 MG PO TABS
15.0000 mg | ORAL_TABLET | Freq: Two times a day (BID) | ORAL | Status: DC
  Administered 2023-08-09 – 2023-08-11 (×4): 15 mg via ORAL
  Filled 2023-08-09 (×4): qty 1

## 2023-08-09 MED ORDER — WARFARIN SODIUM 10 MG PO TABS
10.0000 mg | ORAL_TABLET | ORAL | Status: DC
  Filled 2023-08-09: qty 1

## 2023-08-09 MED ORDER — ATORVASTATIN CALCIUM 40 MG PO TABS
40.0000 mg | ORAL_TABLET | Freq: Every day | ORAL | Status: DC
  Administered 2023-08-10 – 2023-08-11 (×2): 40 mg via ORAL
  Filled 2023-08-09 (×2): qty 1

## 2023-08-09 MED ORDER — ONDANSETRON HCL 4 MG PO TABS: 4.0000 mg | ORAL_TABLET | Freq: Four times a day (QID) | ORAL | Status: DC | PRN

## 2023-08-09 MED ORDER — GUAIFENESIN ER 600 MG PO TB12
600.0000 mg | ORAL_TABLET | Freq: Two times a day (BID) | ORAL | Status: DC
  Administered 2023-08-10: 600 mg via ORAL
  Filled 2023-08-09 (×3): qty 1

## 2023-08-09 MED ORDER — SODIUM CHLORIDE 0.9% FLUSH
3.0000 mL | Freq: Two times a day (BID) | INTRAVENOUS | Status: DC
  Administered 2023-08-09 – 2023-08-11 (×4): 3 mL via INTRAVENOUS

## 2023-08-09 MED ORDER — ALBUTEROL SULFATE (2.5 MG/3ML) 0.083% IN NEBU: 2.5000 mg | INHALATION_SOLUTION | Freq: Four times a day (QID) | RESPIRATORY_TRACT | Status: DC | PRN

## 2023-08-09 MED ORDER — ONDANSETRON HCL 4 MG/2ML IJ SOLN
4.0000 mg | Freq: Once | INTRAMUSCULAR | Status: AC
  Administered 2023-08-09: 4 mg via INTRAVENOUS
  Filled 2023-08-09: qty 2

## 2023-08-09 MED ORDER — ALPRAZOLAM 0.5 MG PO TABS: 0.2500 mg | ORAL_TABLET | Freq: Two times a day (BID) | ORAL | Status: DC | PRN

## 2023-08-09 MED ORDER — MORPHINE SULFATE (PF) 2 MG/ML IV SOLN
2.0000 mg | INTRAVENOUS | Status: DC | PRN
  Administered 2023-08-09: 2 mg via INTRAVENOUS
  Filled 2023-08-09: qty 1

## 2023-08-09 MED ORDER — ONDANSETRON HCL 4 MG/2ML IJ SOLN
4.0000 mg | Freq: Four times a day (QID) | INTRAMUSCULAR | Status: DC | PRN
  Administered 2023-08-09 – 2023-08-11 (×4): 4 mg via INTRAVENOUS
  Filled 2023-08-09 (×4): qty 2

## 2023-08-09 MED ORDER — WARFARIN SODIUM 7.5 MG PO TABS
15.0000 mg | ORAL_TABLET | ORAL | Status: DC
  Administered 2023-08-09: 15 mg via ORAL
  Filled 2023-08-09: qty 2

## 2023-08-09 MED ORDER — ASPIRIN 81 MG PO CHEW
81.0000 mg | CHEWABLE_TABLET | Freq: Every day | ORAL | Status: DC
  Administered 2023-08-10 – 2023-08-11 (×2): 81 mg via ORAL
  Filled 2023-08-09 (×2): qty 1

## 2023-08-09 MED ORDER — MORPHINE SULFATE (PF) 4 MG/ML IV SOLN
4.0000 mg | Freq: Once | INTRAVENOUS | Status: AC
  Administered 2023-08-09: 4 mg via INTRAVENOUS
  Filled 2023-08-09: qty 1

## 2023-08-09 MED ORDER — VORTIOXETINE HBR 5 MG PO TABS
5.0000 mg | ORAL_TABLET | Freq: Every day | ORAL | Status: DC
Start: 2023-08-10 — End: 2023-08-11
  Administered 2023-08-10 – 2023-08-11 (×2): 5 mg via ORAL
  Filled 2023-08-09 (×2): qty 1

## 2023-08-09 MED ORDER — SODIUM CHLORIDE 0.9 % IV SOLN: INTRAVENOUS | Status: AC

## 2023-08-09 MED ORDER — ACETAMINOPHEN 325 MG PO TABS
650.0000 mg | ORAL_TABLET | Freq: Four times a day (QID) | ORAL | Status: DC | PRN
  Administered 2023-08-10 (×2): 650 mg via ORAL
  Filled 2023-08-09 (×2): qty 2

## 2023-08-09 MED ORDER — WARFARIN - PHARMACIST DOSING INPATIENT: Freq: Every day | Status: DC

## 2023-08-09 MED ORDER — HYDROMORPHONE HCL 1 MG/ML IJ SOLN
0.5000 mg | Freq: Once | INTRAMUSCULAR | Status: AC
  Administered 2023-08-09: 0.5 mg via INTRAVENOUS
  Filled 2023-08-09: qty 0.5

## 2023-08-09 MED ORDER — FAMOTIDINE IN NACL 20-0.9 MG/50ML-% IV SOLN
20.0000 mg | Freq: Once | INTRAVENOUS | Status: AC
Start: 2023-08-09 — End: 2023-08-09
  Administered 2023-08-09: 20 mg via INTRAVENOUS
  Filled 2023-08-09: qty 50

## 2023-08-09 MED ORDER — GADOBUTROL 1 MMOL/ML IV SOLN
9.5000 mL | Freq: Once | INTRAVENOUS | Status: AC | PRN
  Administered 2023-08-09: 9.5 mL via INTRAVENOUS

## 2023-08-09 MED ORDER — HYDROCODONE-ACETAMINOPHEN 5-325 MG PO TABS
1.0000 | ORAL_TABLET | Freq: Four times a day (QID) | ORAL | Status: DC | PRN
  Administered 2023-08-09 – 2023-08-11 (×4): 1 via ORAL
  Filled 2023-08-09 (×4): qty 1

## 2023-08-09 MED ORDER — FOLIC ACID 1 MG PO TABS
1.0000 mg | ORAL_TABLET | Freq: Every day | ORAL | Status: DC
  Administered 2023-08-10 – 2023-08-11 (×2): 1 mg via ORAL
  Filled 2023-08-09 (×2): qty 1

## 2023-08-09 NOTE — H&P (Addendum)
 History and Physical    Patient: Kristen Hoover FMW:[id number] DOB: [birth year] DOA: 08/09/2023 DOS: the patient was seen and examined on 08/09/2023 PCP: Allwardt, Mardy HERO, PA-C  Patient coming from: Home  Chief Complaint:  Chief Complaint  Patient presents with   Abdominal Pain   HPI: Kristen Hoover is a 38 y.o. female with medical history significant of hypertension,  CVA, antiphospholipid syndrome on chronic anticoagulation, small PFO, PCOS, pancreatitis , anxiety, depression, and GERDwho presents with upper abdominal pain and vomiting.  She has sharp pain in the upper abdomen radiating to the back, which began yesterday around 4 PM, followed by vomiting. Symptoms started approximately two hours after eating. The current pain is less severe than previous episode of pancreatitis which was related to pancreatic divisum requiring stenting back in 2006. She attempted to manage the pain with Tums and rest but sought medical attention due to persistent pain and concern about potential bleeding, as she is on blood thinners.  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 She has antiphospholipid syndrome and experienced a stroke discovered in February 2024. She is on anticoagulation therapy with Coumadin , taking 10 mg on Monday, Wednesday, Friday, and Sunday, and 15 mg on Tuesday, Thursday, and Saturday. Her INR was 2.5 today. She also takes baby aspirin  81 mg daily.  No recent alcohol consumption except for a glass of wine a week ago while on vacation. No diarrhea but constipation with occasional red blood in the stool, attributed to hemorrhoids.  She is also on Zepbound but states that this is related  In the ED patient was noted to have stable vital signs.  Labs significant for WBC 11.3, INR 2.5, and lipase 1764.  MRCP have been ordered.  Patient had been given 1 L normal saline IV fluids, Zofran , morphine  4 mg IV, Pepcid  20 mg IV.  Review  of Systems: As mentioned in the history of present illness. All other systems reviewed and are negative. Past Medical History:  Diagnosis Date   Anxiety and depression    Clotting disorder (HCC)    Frequency of urination    GERD (gastroesophageal reflux disease)    Heart murmur    History of blood clots    History of pancreatitis    12-28-2018-  last episode 2006   Hypertension    PCOS (polycystic ovarian syndrome)    Renal calculus, right    Stroke [facility])    Urgency of urination    Wears glasses    Past Surgical History:  Procedure Laterality Date   BUBBLE STUDY  03/22/2022   Procedure: BUBBLE STUDY;  Surgeon: Pietro Redell RAMAN, MD;  Location: [facility] ENDOSCOPY;  Service: Cardiovascular;;   CYSTOSCOPY WITH RETROGRADE PYELOGRAM, URETEROSCOPY AND STENT PLACEMENT Right 01/05/2019   Procedure: CYSTOSCOPY/URETEROSCOP/STENT PLACEMENT;  Surgeon: Elisabeth Valli BIRCH, MD;  Location: [facility];  Service: Urology;  Laterality: Right;  75 MINS   CYSTOSCOPY WITH RETROGRADE PYELOGRAM, URETEROSCOPY AND STENT PLACEMENT Right 02/09/2019   Procedure: CYSTOSCOPY WITH RETROGRADE PYELOGRAM, URETEROSCOPY AND STENT PLACEMENT;  Surgeon: Elisabeth Valli BIRCH, MD;  Location[facility];  Service: Urology;  Laterality: Right;  1 HR   ESOPHAGOGASTRODUODENOSCOPY  2015   HOLMIUM LASER APPLICATION Right 02/09/2019   Procedure: HOLMIUM LASER APPLICATION;  Surgeon: Elisabeth Valli BIRCH, MD;  Location: [facility];  Service: Urology;  Laterality: Right;   TEE WITHOUT CARDIOVERSION N/A 03/22/2022   Procedure: TRANSESOPHAGEAL ECHOCARDIOGRAM (TEE);  Surgeon: Pietro Redell RAMAN, MD;  Location: [facility] ENDOSCOPY;  Service: Cardiovascular;  Laterality: N/A;   TONSILLECTOMY  2014   WISDOM TOOTH EXTRACTION  2018   Social History:  reports that she has never smoked. She has never used smokeless tobacco. She reports current alcohol use. She reports that she does not currently use drugs after having used  the following drugs: Marijuana.  No Known Allergies  Family History  Problem Relation Age of Onset   Fibromyalgia Mother    Headache Mother    Neuropathy Father    Diabetes Father    Heart failure Father    Diabetes Brother    Breast cancer Maternal Grandmother    Colon cancer Paternal Grandfather    Liver disease Neg Hx    Esophageal cancer Neg Hx    Colon polyps Neg Hx    Rectal cancer Neg Hx    Stomach cancer Neg Hx     Prior to Admission medications   Medication Sig Start Date End Date Taking? Authorizing Provider  ALPRAZolam  (XANAX ) 0.5 MG tablet Take 1/2 to 1 tab po BID prn for acute panic. 05/13/22   Allwardt, Mardy HERO, PA-C  aspirin  81 MG chewable tablet Chew 81 mg by mouth daily.    [provider]  atorvastatin  (LIPITOR) 40 MG tablet Take 1 tablet (40 mg total) by mouth daily. 11/18/22   Allwardt, Alyssa M, PA-C  busPIRone  (BUSPAR ) 10 MG tablet Take 10 mg by mouth 2 (two) times daily. 01/17/23   [provider]  calcium  carbonate (TUMS - DOSED IN MG ELEMENTAL CALCIUM ) 500 MG chewable tablet Chew 2 tablets by mouth daily as needed for indigestion or heartburn.    [provider]  docusate sodium  (COLACE) 100 MG capsule Take 2 capsules (200 mg total) by mouth daily. 11/22/22   Charlanne Groom, MD  folic acid  (FOLVITE ) 1 MG tablet Take 1 mg by mouth daily.    [provider]  levocetirizine (XYZAL ) 5 MG tablet TAKE 1 TABLET (5 MG TOTAL) BY MOUTH IN THE MORNING AND AT BEDTIME 03/25/23   Iva Marty Saltness, MD  metFORMIN  (GLUCOPHAGE -XR) 750 MG 24 hr tablet TAKE 1 TABLET BY MOUTH 2 TIMES DAILY. 07/29/23   Allwardt, Alyssa M, PA-C  OMEGA-3-ACID ETHYL ESTERS PO Take 650 mg by mouth daily.    [provider]  ondansetron  (ZOFRAN ) 4 MG tablet TAKE 1 TABLET BY MOUTH EVERY 8 HOURS AS NEEDED FOR NAUSEA AND VOMITING 09/13/22   Allwardt, Alyssa M, PA-C  ondansetron  (ZOFRAN -ODT) 4 MG disintegrating tablet TAKE 1 TABLET BY MOUTH EVERY 8 HOURS AS  NEEDED FOR NAUSEA AND VOMITING 03/26/23   Allwardt, Alyssa M, PA-C  spironolactone  (ALDACTONE ) 50 MG tablet TAKE 1 TABLET BY MOUTH EVERY DAY 04/21/23   Allwardt, Alyssa M, PA-C  tirzepatide (ZEPBOUND) 2.5 MG/0.5ML Pen Inject 2.5 mg into the skin once a week. 05/29/23   [provider]  TRINTELLIX  5 MG TABS tablet Take 5 mg by mouth daily. 11/19/22   [provider]  Turmeric (QC TUMERIC COMPLEX) 500 MG CAPS Take 500 mg by mouth daily.    [provider]  warfarin (COUMADIN ) 5 MG tablet TAKE 3 TABLETS BY MOUTH DAILY EXCEPT 3 1/2 TABLETS ON WEDNESDAYS OR AS DIRECTED BY ANTICOAGULATION CLINIC 05/19/23   Allwardt, Mardy HERO, PA-C    Physical Exam: Vitals:   08/09/23 1030 08/09/23 1100 08/09/23 1130 08/09/23 1202  BP: 103/65 (!) 114/52 (!) 111/52 101/69  Pulse: 81 76 76 74  Resp:  16  Temp:    98.5 F (36.9 C)  TempSrc:    Oral  SpO2: 100% 100% 100% 100%    Constitutional: Young female currently in no acute distress.    Eyes: PERRL, lids and conjunctivae normal ENMT: Mucous membranes are moist.  Normal dentition Neck: normal, supple,  Respiratory: clear to auscultation bilaterally, no wheezing, no crackles. Normal respiratory effort. No accessory muscle use.  Cardiovascular: Regular rate and rhythm, no murmurs / rubs / gallops. No extremity edema. 2+ pedal pulses.   Abdomen: Mild epigastric tenderness to palpation Musculoskeletal: no clubbing / cyanosis. No joint deformity upper and lower extremities. Good ROM, no contractures. Normal muscle tone.  Skin: no rashes, lesions, ulcers. No induration Neurologic: CN 2-12 grossly intact.   Strength 5/5 in all 4.  Psychiatric: Normal judgment and insight. Alert and oriented x 3. Normal mood.   Data Reviewed:  reviewed labs, imaging, and pertinent records as documented  Assessment and Plan:  Pancreatitis History of pancreatitis Acute.  Patient presents with epigastric abdominal pain radiating to her back since  yesterday.  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  Noted to have lipase 1764.  Case have been discussed with gastroenterology who recommend checking MRCP.  Risk factors include prior history of pancreatitis.  Question if secondary to pancreatic divisum versus patient being on Zepbound although on low-dose versus possibility of alcohol use - Admit to a MedSurg bed - Aspiration precautions with elevation head of bed - Normal saline IV fluids at 150 mL/h - Hydrocodone /morphine  IV as needed for moderate to severe pain respectively  Leukocytosis Acute.  WBC elevated 11.3. - Suspect secondary to above  History of CVA Antiphospholipid syndrome on chronic anticoagulation Patient with a prior history of antiphospholipid syndrome as well as CVAs without residual deficit. - Continue aspirin , statin, and Coumadin  per pharmacy  Hyperlipidemia - Continue atorvastatin   Anxiety and depression - Continue Trintellix , BuSpar , and Ativan as needed   DVT prophylaxis: Lovenox  Advance Care Planning:   Code Status: Full Code   Consults: Gastroenterology  Family Communication: None  Severity of Illness: The appropriate patient status for this patient is INPATIENT. Inpatient status is judged to be reasonable and necessary in order to provide the required intensity of service to ensure the patient's safety. The patient's presenting symptoms, physical exam findings, and initial radiographic and laboratory data in the context of their chronic comorbidities is felt to place them at high risk for further clinical deterioration. Furthermore, it is not anticipated that the patient will be medically stable for discharge from the hospital within 2 midnights of admission.   * I certify that at the point of admission it is my clinical judgment that the patient will require inpatient hospital care spanning beyond 2 midnights from the point of admission due  to high intensity of service, high risk for further deterioration and high frequency of surveillance required.*  Author: Maximino DELENA Sharps, MD 08/09/2023 1:08 PM  For on call review [email or web address].

## 2023-08-09 NOTE — Progress Notes (Signed)
 Pt stated that her feet are burning and they are red this happened shortly after she got the morphine  when she stepped on the floor to get in the wheelchair to go to MRI that's when she started feeling the pain and now it won't stop. RN messaged MD to make him aware.

## 2023-08-09 NOTE — Progress Notes (Signed)
 38yo F accepted to [facility] Med Surg for acute pancreatitis.   History of pancreatic divisum with stent placement many years ago. Also on Coumadin  due to antiphospholipid Ab syndrome.   [facility] was consult by ER and they request admit to [facility].  Also request MRCP which was ordered by ER PA.

## 2023-08-09 NOTE — ED Notes (Signed)
 [facility] called for transport

## 2023-08-09 NOTE — ED Provider Notes (Signed)
 [place] EMERGENCY DEPARTMENT AT [facility] Provider Note   CSN: [id number] Arrival date & time: 08/09/23  0900     Patient presents with: Abdominal Pain   Kristen Hoover is a 38 y.o. female who  has a past medical history of Anxiety and depression, Clotting disorder (HCC), Frequency of urination, GERD (gastroesophageal reflux disease), Heart murmur, History of blood clots, History of pancreatitis due to pancreas divisum, Hypertension, PCOS (polycystic ovarian syndrome), Renal calculus, right, Stroke (HCC) anticoagulated on warfarin, Urgency of urination, and Wears glasses. She presents with c/o epigastric abdominal pain and vomiting.  She had onset of symptoms beginning yesterday about 4:00 PM about 2 hours after eating lunch.  Since that time she has had multiple episodes of nonbilious nonbloody vomitus.  Patient states that she has been a bit constipated this week with small bowel movements.  She denies diarrhea fevers or chills.  She complains of persistent epigastric abdominal pain which hurts when she laughs or moves.  She was concerned that potentially she could have internal bleeding because she saw blood on her stool and when she was wiping but notes that she does have a history of internal hemorrhoids.  She does not have any reflux symptoms regularly.  She describes the pain as burning, radiating to her back.  Different from her previous history of pancreatitis approximately 10 years ago. She does not drink alcohol.  She does not take NSAIDs.  She currently rates her pain at 6 out of 10. Patient is on GLP-2. Last dose 8 days ago.   {Add pertinent medical, surgical, social history, OB history to HPI:32947}  Abdominal Pain      Prior to Admission medications   Medication Sig Start Date End Date Taking? Authorizing Provider  ALPRAZolam  (XANAX ) 0.5 MG tablet Take 1/2 to 1 tab po BID prn for acute panic. 05/13/22   Allwardt, Mardy HERO, PA-C  aspirin  81 MG chewable  tablet Chew 81 mg by mouth daily.    [provider]  atorvastatin  (LIPITOR) 40 MG tablet Take 1 tablet (40 mg total) by mouth daily. 11/18/22   Allwardt, Alyssa M, PA-C  busPIRone  (BUSPAR ) 10 MG tablet Take 10 mg by mouth 2 (two) times daily. 01/17/23   [provider]  calcium  carbonate (TUMS - DOSED IN MG ELEMENTAL CALCIUM ) 500 MG chewable tablet Chew 2 tablets by mouth daily as needed for indigestion or heartburn.    [provider]  docusate sodium  (COLACE) 100 MG capsule Take 2 capsules (200 mg total) by mouth daily. 11/22/22   Charlanne Groom, MD  folic acid  (FOLVITE ) 1 MG tablet Take 1 mg by mouth daily.    [provider]  levocetirizine (XYZAL ) 5 MG tablet TAKE 1 TABLET (5 MG TOTAL) BY MOUTH IN THE MORNING AND AT BEDTIME 03/25/23   Iva Marty Saltness, MD  metFORMIN  (GLUCOPHAGE -XR) 750 MG 24 hr tablet TAKE 1 TABLET BY MOUTH 2 TIMES DAILY. 07/29/23   Allwardt, Alyssa M, PA-C  OMEGA-3-ACID ETHYL ESTERS PO Take 650 mg by mouth daily.    [provider]  ondansetron  (ZOFRAN ) 4 MG tablet TAKE 1 TABLET BY MOUTH EVERY 8 HOURS AS NEEDED FOR NAUSEA AND VOMITING 09/13/22   Allwardt, Alyssa M, PA-C  ondansetron  (ZOFRAN -ODT) 4 MG disintegrating tablet TAKE 1 TABLET BY MOUTH EVERY 8 HOURS AS NEEDED FOR NAUSEA AND VOMITING 03/26/23   Allwardt, Alyssa M, PA-C  spironolactone  (ALDACTONE ) 50 MG tablet TAKE 1 TABLET BY MOUTH EVERY DAY 04/21/23   Allwardt, Alyssa  M, PA-C  tirzepatide (ZEPBOUND) 2.5 MG/0.5ML Pen Inject 2.5 mg into the skin once a week. 05/29/23   [provider]  TRINTELLIX  5 MG TABS tablet Take 5 mg by mouth daily. 11/19/22   [provider]  Turmeric (QC TUMERIC COMPLEX) 500 MG CAPS Take 500 mg by mouth daily.    [provider]  warfarin (COUMADIN ) 5 MG tablet TAKE 3 TABLETS BY MOUTH DAILY EXCEPT 3 1/2 TABLETS ON WEDNESDAYS OR AS DIRECTED BY ANTICOAGULATION CLINIC 05/19/23   Allwardt, Alyssa M, PA-C    Allergies: Patient has  no known allergies.    Review of Systems  Gastrointestinal:  Positive for abdominal pain.    Updated Vital Signs BP 139/75   Pulse 75   Temp 98.5 F (36.9 C) (Oral)   Resp 16   SpO2 99%   Physical Exam Vitals and nursing note reviewed.  Constitutional:      General: She is not in acute distress.    Appearance: She is well-developed. She is not diaphoretic.  HENT:     Head: Normocephalic and atraumatic.     Right Ear: External ear normal.     Left Ear: External ear normal.     Nose: Nose normal.     Mouth/Throat:     Mouth: Mucous membranes are moist.  Eyes:     General: No scleral icterus.    Conjunctiva/sclera: Conjunctivae normal.  Cardiovascular:     Rate and Rhythm: Normal rate and regular rhythm.     Heart sounds: Normal heart sounds. No murmur heard.    No friction rub. No gallop.  Pulmonary:     Effort: Pulmonary effort is normal. No respiratory distress.     Breath sounds: Normal breath sounds.  Abdominal:     General: Bowel sounds are normal. There is no distension.     Palpations: Abdomen is soft. There is no mass.     Tenderness: There is abdominal tenderness in the epigastric area. There is no guarding or rebound.  Musculoskeletal:     Cervical back: Normal range of motion.  Skin:    General: Skin is warm and dry.  Neurological:     Mental Status: She is alert and oriented to person, place, and time.  Psychiatric:        Behavior: Behavior normal.     (all labs ordered are listed, but only abnormal results are displayed) Labs Reviewed - No data to display  EKG: None  Radiology: No results found.  {Document cardiac monitor, telemetry assessment procedure when appropriate:32947} Procedures   Medications Ordered in the ED - No data to display  Clinical Course as of 08/09/23 1105  Sat Aug 09, 2023  1041 WBC(!): 11.3 [AH]  1042 Lipase(!): 1,764 [AH]  1059 Case discussed with APP [place] on call for [place] gastro. Recommends MR abdomen/  MRCP. [facility] - [facility] will consult. [AH]    Clinical Course User Index [AH] Arloa Chroman, PA-C   {Click here for ABCD2, HEART and other calculators REFRESH Note before signing:1}                              Medical Decision Making This patient presents to the ED for concern of epigastric abdominal pain , this involves an extensive number of treatment options, and is a complaint that carries with it a high risk of complications and morbidity.   Differential diagnosis of epigastric pain includes: PUD, GERD,  Gastritis,pancreatitis,gastroparesis, abdominal cancers biliary disease or obstruction, ACS, pericarditis, pneumonia, abdominal hernia, intestinal ischemia, esophageal rupture, gastric volvulus, hepatitis.   Co morbidities:      Pancrease Divisum s/p stent Antiphospholipid antibody syndrome on warfarin On GLP-2   Social Determinants of Health:        SDOH Screenings Food Insecurity: No Food Insecurity (05/12/2023)   Received from [facility] Housing: Low Risk  (05/12/2023)   Received from [facility] Transportation Needs: No Transportation Needs (05/12/2023)   Received from [facility] Utilities: Not At Risk (05/12/2023)   Received from [facility] Alcohol Screen: Low Risk  (03/04/2023) Depression (PHQ2-9): Low Risk  (06/03/2023) Financial Resource Strain: Low Risk  (05/12/2023)   Received from [facility] Physical Activity: Unknown (03/04/2023) Social Connections: Unknown (03/04/2023) Stress: Stress Concern Present (03/04/2023) Tobacco Use: Low Risk  (06/13/2023)   Additional history:  {Additional history obtained from husband at bedside {External records from outside source obtained and reviewed including- previous GI notes  Lab Tests:  I Ordered, and personally interpreted labs.  The pertinent results include:    Wbc 11.3 Hgb. 13.3 CMP WNL Lipase 1764 INR 2.5  therapeutic  Imaging Studies:  I ordered imaging studies including MRCP per Gastroenterology None available at this facility I ordered medication including Medications diazepam  (VALIUM ) injection 2.5 mg (has no administration in time range) sodium chloride  0.9 % bolus 1,000 mL (1,000 mLs Intravenous New Bag/Given 08/09/23 0949) famotidine  (PEPCID ) IVPB 20 mg premix (0 mg Intravenous Stopped 08/09/23 1022)  ondansetron  (ZOFRAN ) injection 4 mg (4 mg Intravenous Given 08/09/23 0950) for  premedication for MRI- nausea and epigastric pain  Reevaluation of the patient after these medicines showed that the patient improved I have reviewed the patients home medicines and have made adjustments as needed  Test Considered:         Critical Interventions:         Consultations Obtained: Per ED course   Problem List / ED Course:       (K85.90) Acute pancreatitis, unspecified complication status, unspecified pancreatitis type  (primary encounter diagnosis)   MDM: Here with epigastric pain and appears to have acute pancreatitis.  Concern for potential stent failure versus GLP induced pancreatitis.   Dispostion:  After consideration of the diagnostic results and the patients response to treatment, I feel that the patent would benefit from admit .    Amount and/or Complexity of Data Reviewed Labs: ordered. Decision-making details documented in ED Course. Radiology: ordered.  Risk Prescription drug management. Decision regarding hospitalization.   ***  {Document critical care time when appropriate  Document review of labs and clinical decision tools ie CHADS2VASC2, etc  Document your independent review of radiology images and any outside records  Document your discussion with family members, caretakers and with consultants  Document social determinants of health affecting pt's care  Document your decision making why or why not admission, treatments were needed:32947:::1}    Final diagnoses:  None    ED Discharge Orders     None

## 2023-08-09 NOTE — ED Triage Notes (Addendum)
 Patient states upper abdominal pain since yesterday with multiple episodes of vomiting. Hx of pancreatitis. Takes a GLP1. On blood thinners.

## 2023-08-09 NOTE — ED Notes (Signed)
 Patient given urine cup and instructions to provide clean catch urine sample.

## 2023-08-09 NOTE — ED Notes (Signed)
 [facility] transported to [facility]

## 2023-08-09 NOTE — Progress Notes (Addendum)
 PHARMACY - ANTICOAGULATION CONSULT NOTE  Pharmacy Consult for warfarin Indication: Antiphospholipid syndrome on chronic anticoagulation, history of CVA  No Known Allergies  Vital Signs: Temp: 98.5 F (36.9 C) (08/02 1202) Temp Source: Oral (08/02 1202) BP: 101/69 (08/02 1202) Pulse Rate: 74 (08/02 1202)  Labs: Recent Labs    08/09/23 0924  HGB 13.5  HCT 38.1  PLT 228  LABPROT 28.4*  INR 2.5*  CREATININE 0.83    CrCl cannot be calculated (Unknown ideal weight.).  Medical History: Past Medical History:  Diagnosis Date   Anxiety and depression    Clotting disorder (HCC)    Frequency of urination    GERD (gastroesophageal reflux disease)    Heart murmur    History of blood clots    History of pancreatitis    12-28-2018-  last episode 2006   Hypertension    PCOS (polycystic ovarian syndrome)    Renal calculus, right    Stroke [facility])    Urgency of urination    Wears glasses    Assessment: Home Dose: 15 mg Tuesday, Thursday, Saturday; 10 mg all other days  Significant drug interactions: none identified   8/2: INR = 2.5  Pharmacy consulted to manage warfarin during inpatient admission. Patient presented to the hospital on 08/09/23 with a chief complaint of upper abdominal pain and vomiting. Patient's warfarin home regimen, indication, and goal INR was confirmed via chart review of recent outpatient anti-coag visit (07/28/23).   Today's INR upon admission is therapeutic at 2.5. Will resume patient's PTA home regimen for now.   Goal of Therapy:  INR 2-3 Monitor platelets by anticoagulation protocol: Yes   Plan:  Monitor daily INR.  Give warfarin 15 mg today (08/09/23)  Feliciano Close, PharmD PGY2 Infectious Diseases Pharmacy Resident  08/09/2023 2:39 PM

## 2023-08-09 NOTE — Progress Notes (Signed)
 Pt just arrived on the unit, [facility] admitting paged to let thin know pt has arrived awaiting call back to see which MD will be assigned to get floor orders.

## 2023-08-10 ENCOUNTER — Inpatient Hospital Stay (HOSPITAL_COMMUNITY)

## 2023-08-10 DIAGNOSIS — K859 Acute pancreatitis without necrosis or infection, unspecified: Secondary | ICD-10-CM | POA: Diagnosis not present

## 2023-08-10 DIAGNOSIS — E7800 Pure hypercholesterolemia, unspecified: Secondary | ICD-10-CM

## 2023-08-10 DIAGNOSIS — D72823 Leukemoid reaction: Secondary | ICD-10-CM | POA: Diagnosis not present

## 2023-08-10 DIAGNOSIS — Z8673 Personal history of transient ischemic attack (TIA), and cerebral infarction without residual deficits: Secondary | ICD-10-CM | POA: Diagnosis not present

## 2023-08-10 DIAGNOSIS — Q453 Other congenital malformations of pancreas and pancreatic duct: Secondary | ICD-10-CM

## 2023-08-10 DIAGNOSIS — D6861 Antiphospholipid syndrome: Secondary | ICD-10-CM | POA: Diagnosis not present

## 2023-08-10 DIAGNOSIS — K853 Drug induced acute pancreatitis without necrosis or infection: Secondary | ICD-10-CM | POA: Diagnosis not present

## 2023-08-10 LAB — COMPREHENSIVE METABOLIC PANEL WITH GFR
ALT: 15 U/L (ref 0–44)
AST: 14 U/L — ABNORMAL LOW (ref 15–41)
Albumin: 2.9 g/dL — ABNORMAL LOW (ref 3.5–5.0)
Alkaline Phosphatase: 61 U/L (ref 38–126)
Anion gap: 7 (ref 5–15)
BUN: 8 mg/dL (ref 6–20)
CO2: 23 mmol/L (ref 22–32)
Calcium: 8 mg/dL — ABNORMAL LOW (ref 8.9–10.3)
Chloride: 108 mmol/L (ref 98–111)
Creatinine, Ser: 0.8 mg/dL (ref 0.44–1.00)
GFR, Estimated: >60 mL/min (ref >60)
Glucose, Bld: 96 mg/dL (ref 70–99)
Potassium: 3.9 mmol/L (ref 3.5–5.1)
Sodium: 138 mmol/L (ref 135–145)
Total Bilirubin: 1.1 mg/dL (ref 0.0–1.2)
Total Protein: 5.9 g/dL — ABNORMAL LOW (ref 6.5–8.1)

## 2023-08-10 LAB — LIPASE, BLOOD: Lipase: 155 U/L — ABNORMAL HIGH (ref 11–51)

## 2023-08-10 LAB — CBC
HCT: 30.6 % — ABNORMAL LOW (ref 36.0–46.0)
Hemoglobin: 10.6 g/dL — ABNORMAL LOW (ref 12.0–15.0)
MCH: 27.8 pg (ref 26.0–34.0)
MCHC: 34.6 g/dL (ref 30.0–36.0)
MCV: 80.3 fL (ref 80.0–100.0)
Platelets: 170 K/uL (ref 150–400)
RBC: 3.81 MIL/uL — ABNORMAL LOW (ref 3.87–5.11)
RDW: 14.3 % (ref 11.5–15.5)
WBC: 11.5 K/uL — ABNORMAL HIGH (ref 4.0–10.5)
nRBC: 0 % (ref 0.0–0.2)

## 2023-08-10 LAB — SEDIMENTATION RATE: Sed Rate: 8 mm/h (ref 0–22)

## 2023-08-10 LAB — TRIGLYCERIDES: Triglycerides: 81 mg/dL (ref <150)

## 2023-08-10 LAB — C-REACTIVE PROTEIN: CRP: 2.8 mg/dL — ABNORMAL HIGH (ref <1.0)

## 2023-08-10 LAB — PROTIME-INR
INR: 3.4 — ABNORMAL HIGH (ref 0.8–1.2)
Prothrombin Time: 35.9 s — ABNORMAL HIGH (ref 11.4–15.2)

## 2023-08-10 MED ORDER — PROCHLORPERAZINE EDISYLATE 10 MG/2ML IJ SOLN
10.0000 mg | Freq: Once | INTRAMUSCULAR | Status: AC
  Administered 2023-08-10: 10 mg via INTRAVENOUS
  Filled 2023-08-10: qty 2

## 2023-08-10 MED ORDER — HYDROMORPHONE HCL 1 MG/ML IJ SOLN
1.0000 mg | INTRAMUSCULAR | Status: DC | PRN
  Administered 2023-08-10: 1 mg via INTRAVENOUS
  Filled 2023-08-10: qty 1

## 2023-08-10 NOTE — Progress Notes (Signed)
 PHARMACY - ANTICOAGULATION CONSULT NOTE  Pharmacy Consult for warfarin Indication: Antiphospholipid syndrome on chronic anticoagulation, history of CVA  No Known Allergies  Vital Signs: Temp: 98.1 F (36.7 C) (08/03 0821) Temp Source: Oral (08/03 0821) BP: 94/62 (08/03 0821) Pulse Rate: 78 (08/03 0821)  Labs: Recent Labs    08/09/23 0924 08/10/23 0415  HGB 13.5 10.6*  HCT 38.1 30.6*  PLT 228 170  LABPROT 28.4* 35.9*  INR 2.5* 3.4*  CREATININE 0.83 0.80    CrCl cannot be calculated (Unknown ideal weight.).  Medical History: Past Medical History:  Diagnosis Date   Anxiety and depression    Clotting disorder (HCC)    Frequency of urination    GERD (gastroesophageal reflux disease)    Heart murmur    History of blood clots    History of pancreatitis    12-28-2018-  last episode 2006   Hypertension    PCOS (polycystic ovarian syndrome)    Renal calculus, right    Stroke [facility])    Urgency of urination    Wears glasses    Assessment: Home Dose: 15 mg Tuesday, Thursday, Saturday; 10 mg all other days  Significant drug interactions: none identified   8/2: INR = 2.5 8/3: INR = 3.4  Pharmacy consulted to manage warfarin during inpatient admission. Patient presented to the hospital on 08/09/23 with a chief complaint of upper abdominal pain and vomiting. Patient's warfarin home regimen (patient reports taking warfarin doses at 2100), indication, and goal INR was confirmed via chart review of recent outpatient anti-coag visit (07/28/23). INR upon admission was therapeutic at 2.5.   Today's INR is supratherapeutic at 3.4. Given rapid increase in INR, will plan to hold warfarin dose today and monitor INR trends.   Goal of Therapy:  INR 2-3 Monitor platelets by anticoagulation protocol: Yes   Plan:  Monitor daily INR.  Hold warfarin dose today.   Feliciano Close, PharmD PGY2 Infectious Diseases Pharmacy Resident  08/10/2023 2:46 PM

## 2023-08-10 NOTE — Plan of Care (Signed)
   Problem: Education: Goal: Knowledge of General Education information will improve Description Including pain rating scale, medication(s)/side effects and non-pharmacologic comfort measures Outcome: Progressing   Problem: Health Behavior/Discharge Planning: Goal: Ability to manage health-related needs will improve Outcome: Progressing

## 2023-08-10 NOTE — Progress Notes (Signed)
 Pt says my feet are on fire, I think it's the morphine . Pt only has po meds Norco/Vic. Pt requesting dilaudid .  Will have day shift MD's look at this and decide on dilaudid .  PO meds are not helping the abdomen pain. Will pass on to day shift for assessment

## 2023-08-10 NOTE — Progress Notes (Signed)
 [facility]                                                                              Kristen Hoover, is a 38 y.o. female, DOB - 05/16/1985, FMW:[id number] Admit date - 08/09/2023    Outpatient Primary MD for the patient is Allwardt, Alyssa M, PA-C  LOS - 1  days  Chief Complaint  Patient presents with   Abdominal Pain       Brief summary   Patient is a 38 y.o. female with medical history significant of hypertension,  CVA, antiphospholipid syndrome on warfarin,  small PFO, PCOS, pancreatitis , anxiety, depression, and GERD presented with epigastric pain, typical of pancreatitis. She had previous episodes of pancreatitis which was related to pancreatic divisum requiring stenting back in 2006  No recent alcohol consumption except for a glass of wine a week ago while on vacation. No diarrhea but constipation with occasional red blood in the stool, attributed to hemorrhoids.  She is also on Zepbound.    Assessment & Plan    Principal Problem:   Acute pancreatitis with prior History of pancreatitis - Prior history of pancreatitis related to pancreatic divisum requiring stent in 2006 - lipase 1764-> 155.  - GI consulted, MRCP consistent with acute pancreatitis, gall bladder unremarkable, no CBD stones, pancreatic divisum but no MR findings for pancreatic duct stent. -  She is on Zepbound, ?stent failure vs GLP 1 related pancreatitis (first episode since 2006) and has been on GLP1 since last year.      - continue IVF, pain control and antiemetics - If improving, will advance to full liquids   - TG 81   Leukocytosis - likely due to above, no necrosis    History of CVA Antiphospholipid syndrome on chronic anticoagulation - Continue aspirin , statin, and Coumadin  per pharmacy   Hyperlipidemia - Continue atorvastatin    Anxiety and depression - Continue Trintellix , BuSpar , and Ativan as needed  Obesity class I Estimated body mass index is 33.09 kg/m as  calculated from the following:   Height as of 06/03/23: 5' 6 (1.676 m).   Weight as of 07/16/23: 93 kg.  Code Status: full code  DVT Prophylaxis:   warfarin (COUMADIN ) tablet 15 mg  warfarin (COUMADIN ) tablet 10 mg   Level of Care: Level of care: Med-Surg Family Communication: Updated patient Disposition Plan:      Remains inpatient appropriate:      Procedures:    Consultants:   GI   Antimicrobials:   Anti-infectives (From admission, onward)    None          Medications  aspirin   81 mg Oral Daily   atorvastatin   40 mg Oral Daily   busPIRone   15 mg Oral BID   folic acid   1 mg Oral Daily   guaiFENesin   600 mg Oral BID   sodium chloride  flush  3 mL Intravenous Q12H   vortioxetine  HBr  5 mg Oral Daily   warfarin  10 mg Oral Once per day on Sunday Monday Wednesday Friday   warfarin  15 mg Oral Once per day on Tuesday Thursday Saturday   Warfarin -  Pharmacist Dosing Inpatient   Does not apply q1600      Subjective:   Kristen Hoover was seen and examined today.  Mild epigastric TTP, no shortness of breath, N/V. No acute events overnight.  No fevers   Objective:   Vitals:   08/09/23 1130 08/09/23 1202 08/09/23 2005 08/10/23 0821  BP: (!) 111/52 101/69 109/70 94/62  Pulse: 76 74 91 78  Resp:  16 16 18   Temp:  98.5 F (36.9 C) 98.1 F (36.7 C) 98.1 F (36.7 C)  TempSrc:  Oral Oral Oral  SpO2: 100% 100%  99%    Intake/Output Summary (Last 24 hours) at 08/10/2023 1002 Last data filed at 08/10/2023 0600 Gross per 24 hour  Intake 2522.67 ml  Output 350 ml  Net 2172.67 ml     Wt Readings from Last 3 Encounters:  07/16/23 93 kg  06/13/23 93.9 kg  06/03/23 95.8 kg     Exam General: Alert and oriented x 3, NAD Cardiovascular: S1 S2 auscultated,  RRR Respiratory: Clear to auscultation bilaterally, no wheezing Gastrointestinal: Soft, mild epigastric TTP, nondistended, + bowel sounds Ext: no pedal edema bilaterally Neuro: no new FND's  Psych: Normal  affect     Data Reviewed:  I have personally reviewed following labs    CBC Lab Results  Component Value Date   WBC 11.5 (H) 08/10/2023   RBC 3.81 (L) 08/10/2023   HGB 10.6 (L) 08/10/2023   HCT 30.6 (L) 08/10/2023   MCV 80.3 08/10/2023   MCH 27.8 08/10/2023   PLT 170 08/10/2023   MCHC 34.6 08/10/2023   RDW 14.3 08/10/2023   LYMPHSABS 1.7 08/09/2023   MONOABS 0.5 08/09/2023   EOSABS 0.2 08/09/2023   BASOSABS 0.0 08/09/2023     Last metabolic panel Lab Results  Component Value Date   NA 138 08/10/2023   K 3.9 08/10/2023   CL 108 08/10/2023   CO2 23 08/10/2023   BUN 8 08/10/2023   CREATININE 0.80 08/10/2023   GLUCOSE 96 08/10/2023   GFRNONAA >60 08/10/2023   GFRAA >60 03/12/2018   CALCIUM  8.0 (L) 08/10/2023   PROT 5.9 (L) 08/10/2023   ALBUMIN 2.9 (L) 08/10/2023   BILITOT 1.1 08/10/2023   ALKPHOS 61 08/10/2023   AST 14 (L) 08/10/2023   ALT 15 08/10/2023   ANIONGAP 7 08/10/2023    CBG (last 3)  No results for input(s): GLUCAP in the last 72 hours.    Coagulation Profile: Recent Labs  Lab 08/09/23 0924 08/10/23 0415  INR 2.5* 3.4*     Radiology Studies: I have personally reviewed the imaging studies  MR ABDOMEN MRCP W WO CONTAST Result Date: 08/09/2023 CLINICAL DATA:  Acute pancreatitis. History of pancreatic divisum and stent placement. EXAM: MRI ABDOMEN WITHOUT AND WITH CONTRAST (INCLUDING MRCP) TECHNIQUE: Multiplanar multisequence MR imaging of the abdomen was performed both before and after the administration of intravenous contrast. Heavily T2-weighted images of the biliary and pancreatic ducts were obtained, and three-dimensional MRCP images were rendered by post processing. CONTRAST:  9.5mL GADAVIST  GADOBUTROL  1 MMOL/ML IV SOLN COMPARISON:  CT scan from 2020 FINDINGS: Lower chest: Very small pleural effusions and bibasilar atelectasis. No pericardial effusion. No lung lesions. Hepatobiliary: No hepatic lesions or intrahepatic biliary dilatation. The  gallbladder is unremarkable. Normal caliber and course of the common bile duct. No common bile duct stones are identified. Pancreas: Findings of acute interstitial pancreatitis with diffuse inflammation/edema throughout the pancreas and in the surrounding peripancreatic soft tissues. No discrete fluid collection or  hematoma. Normal enhancement of the pancreatic parenchyma without findings for pancreatic necrosis. Normal caliber main pancreatic duct. Pancreatic divisum again noted. I do not see any definite MR findings for a pancreatic duct stent. Spleen: Normal size. No splenic lesions. The splenic vein is patent. Adrenals/Urinary Tract:  The adrenal glands and kidneys are normal. Stomach/Bowel: The stomach, duodenum, visualized small bowel and visualized colon are unremarkable. Vascular/Lymphatic: The aorta and branch vessels are patent. The major venous structures are patent. Small scattered mesenteric and retroperitoneal lymph nodes but no mass or overt adenopathy. Other:  No abdominal wall hernia or subcutaneous lesions. Musculoskeletal: No significant bony findings. IMPRESSION: 1. Findings of acute interstitial pancreatitis with diffuse inflammation/edema throughout the pancreas and in the surrounding peripancreatic soft tissues. No discrete fluid collection or hematoma. 2. Normal caliber and course of the common bile duct. No common bile duct stones are identified. 3. Pancreatic divisum again noted. I do not see any definite MR findings for a pancreatic duct stent. 4. Very small pleural effusions and bibasilar atelectasis. Electronically Signed   By: MYRTIS Stammer M.D.   On: 08/09/2023 18:10       Demetra Moya M.D. [facility] 08/10/2023, 10:02 AM  Available via Epic secure chat 7am-7pm After 7 pm, please refer to night coverage provider listed on amion.

## 2023-08-10 NOTE — Consult Note (Signed)
 Consultation  Referring Provider: TRH/Rai Primary Care Physician:  Allwardt, Mardy HERO, PA-C Primary Gastroenterologist:  Dr. Charlanne  Reason for Consultation: Acute pancreatitis, history of pancreas divisum  HPI: Kristen Hoover is a 38 y.o. female with history of hyper, antiphospholipid syndrome status post CVA in 2024 and maintained on chronic Coumadin , also with history of small PFO, PCOS, anxiety depression and GERD. Patient has known pancreas divisum and relates that she had 3 episodes of pancreatitis remotely, then underwent pancreatic duct stenting in 2006/[place] .  She had an episode of post ERCP pancreatitis after that procedure but then has not had any further episodes of pancreatitis since. She relates that she did take Wegovy  for a couple of months, last fall but did not tolerate it and stopped.  She started on Zepbound in May and is currently on a 5 mg dose.  She says that she is taking this for weight loss insetting of PCOS and that is working very well. She developed acute abdominal pain early yesterday, this was described as severe occurred approximately 2 hours after eating some radiation to her back and associated with nausea, but no vomiting.  She tried to manage the pain at home but when it persisted she came to the emergency room.  Today she says this episode is actually not as severe as one of her previous episodes.  Labs in the ER yesterday with WBC of 11.3/hemoglobin 13.5/hematocrit 38.1/MCV 78 Lipase 1764 Pro time 28.4/INR 2.5 LFTs within normal limits/ potassium 3.6/BUN 12/creatinine 0.83  She was transferred here from [facility] and had MRI/MRCP which shows very small pleural effusions bilateral atelectasis, gallbladder unremarkable normal course and caliber of the common bile duct, no common bile duct stones.  There is acute interstitial pancreatitis with diffuse inflammation and edema throughout the pancreas and surrounding peripancreatic tissues no  fluid collection with normal enhancement of the pancreas, normal caliber main pancreatic duct with the pancreas to be some noted pancreatic duct stent not seen  Labs today WBC 11.5/hemoglobin 10.6/hematocrit 30.6 Potassium 3.9/BUN 8/creatinine 0.80 LFTs remain normal.  Patient says she feels a bit better today, she is still nauseated, has been sipping on clear liquids.  Still requiring IV analgesics for pain control.   Past Medical History:  Diagnosis Date   Anxiety and depression    Clotting disorder (HCC)    Frequency of urination    GERD (gastroesophageal reflux disease)    Heart murmur    History of blood clots    History of pancreatitis    12-28-2018-  last episode 2006   Hypertension    PCOS (polycystic ovarian syndrome)    Renal calculus, right    Stroke [facility])    Urgency of urination    Wears glasses     Past Surgical History:  Procedure Laterality Date   BUBBLE STUDY  03/22/2022   Procedure: BUBBLE STUDY;  Surgeon: Pietro Redell RAMAN, MD;  Location: [facility] ENDOSCOPY;  Service: Cardiovascular;;   CYSTOSCOPY WITH RETROGRADE PYELOGRAM, URETEROSCOPY AND STENT PLACEMENT Right 01/05/2019   Procedure: CYSTOSCOPY/URETEROSCOP/STENT PLACEMENT;  Surgeon: Elisabeth Valli BIRCH, MD;  Location: [facility];  Service: Urology;  Laterality: Right;  75 MINS   CYSTOSCOPY WITH RETROGRADE PYELOGRAM, URETEROSCOPY AND STENT PLACEMENT Right 02/09/2019   Procedure: CYSTOSCOPY WITH RETROGRADE PYELOGRAM, URETEROSCOPY AND STENT PLACEMENT;  Surgeon: Elisabeth Valli BIRCH, MD;  Location: [facility];  Service: Urology;  Laterality: Right;  1 HR   ESOPHAGOGASTRODUODENOSCOPY  2015   HOLMIUM LASER APPLICATION Right  02/09/2019   Procedure: HOLMIUM LASER APPLICATION;  Surgeon: Elisabeth Valli BIRCH, MD;  Location: [facility];  Service: Urology;  Laterality: Right;   TEE WITHOUT CARDIOVERSION N/A 03/22/2022   Procedure: TRANSESOPHAGEAL ECHOCARDIOGRAM (TEE);  Surgeon: Pietro Redell RAMAN, MD;  Location: [facility] ENDOSCOPY;  Service: Cardiovascular;  Laterality: N/A;   TONSILLECTOMY  2014   WISDOM TOOTH EXTRACTION  2018    Prior to Admission medications   Medication Sig Start Date End Date Taking? Authorizing Provider  acetaminophen  (TYLENOL ) 500 MG tablet Take 1,000 mg by mouth once as needed (pain).   Yes [provider]  ALPRAZolam  (XANAX ) 0.5 MG tablet Take 1/2 to 1 tab po BID prn for acute panic. Patient taking differently: Take 0.25 mg by mouth daily as needed for anxiety. 05/13/22  Yes Allwardt, Mardy HERO, PA-C  aspirin  EC 81 MG tablet Take 81 mg by mouth daily.   Yes [provider]  atorvastatin  (LIPITOR) 40 MG tablet Take 1 tablet (40 mg total) by mouth daily. 11/18/22  Yes Allwardt, Alyssa M, PA-C  busPIRone  (BUSPAR ) 15 MG tablet Take 15 mg by mouth 2 (two) times daily.   Yes [provider]  Calcium  Carbonate Antacid (TUMS PO) Take 2 tablets by mouth 2 (two) times daily as needed (stomach pain).   Yes [provider]  Cholecalciferol (VITAMIN D -3 PO) Take 1 capsule by mouth daily.   Yes [provider]  folic acid  (FOLVITE ) 1 MG tablet Take 1 mg by mouth daily.   Yes [provider]  levocetirizine (XYZAL ) 5 MG tablet TAKE 1 TABLET (5 MG TOTAL) BY MOUTH IN THE MORNING AND AT BEDTIME 03/25/23  Yes Iva Marty Saltness, MD  metFORMIN  (GLUCOPHAGE -XR) 750 MG 24 hr tablet TAKE 1 TABLET BY MOUTH 2 TIMES DAILY. 07/29/23  Yes Allwardt, Alyssa M, PA-C  Misc Natural Products (TURMERIC, CURCUMIN, PO) Take 1 tablet by mouth daily.   Yes [provider]  Omega-3 Fatty Acids (FISH OIL PO) Take 1 capsule by mouth daily.   Yes [provider]  ondansetron  (ZOFRAN -ODT) 4 MG disintegrating tablet TAKE 1 TABLET BY MOUTH EVERY 8 HOURS AS NEEDED FOR NAUSEA AND VOMITING 03/26/23  Yes Allwardt, Alyssa M, PA-C  spironolactone  (ALDACTONE ) 50 MG tablet TAKE 1 TABLET BY MOUTH EVERY DAY 04/21/23  Yes Allwardt, Alyssa M, PA-C   tirzepatide (ZEPBOUND) 5 MG/0.5ML Pen Inject 5 mg into the skin every Friday.   Yes [provider]  TRINTELLIX  5 MG TABS tablet Take 5 mg by mouth daily. 11/19/22  Yes [provider]  warfarin (COUMADIN ) 5 MG tablet TAKE 3 TABLETS BY MOUTH DAILY EXCEPT 3 1/2 TABLETS ON WEDNESDAYS OR AS DIRECTED BY ANTICOAGULATION CLINIC Patient taking differently: Take 10-15 mg by mouth. Take at night - 3 tablets (15mg ) by mouth on Tuesday, Thursday, Saturday and take 2 tablets(10mg ) on all other days. 05/19/23  Yes Allwardt, Mardy HERO, PA-C    Current Facility-Administered Medications  Medication Dose Route Frequency Provider Last Rate Last Admin   0.9 %  sodium chloride  infusion   Intravenous Continuous Claudene, Rondell A, MD 150 mL/hr at 08/10/23 0300 Infusion Verify at 08/10/23 0300   acetaminophen  (TYLENOL ) tablet 650 mg  650 mg Oral Q6H PRN Claudene Maximino LABOR, MD       Or   acetaminophen  (TYLENOL ) suppository 650 mg  650 mg Rectal Q6H PRN Claudene Maximino A, MD       albuterol  (PROVENTIL ) (2.5 MG/3ML) 0.083% nebulizer solution 2.5 mg  2.5  mg Nebulization Q6H PRN Smith, Rondell A, MD       ALPRAZolam  (XANAX ) tablet 0.25 mg  0.25 mg Oral BID PRN Smith, Rondell A, MD       aspirin  chewable tablet 81 mg  81 mg Oral Daily Smith, Rondell A, MD   81 mg at 08/10/23 9056   atorvastatin  (LIPITOR) tablet 40 mg  40 mg Oral Daily Smith, Rondell A, MD   40 mg at 08/10/23 9056   busPIRone  (BUSPAR ) tablet 15 mg  15 mg Oral BID Smith, Rondell A, MD   15 mg at 08/10/23 9056   calcium  carbonate (TUMS - dosed in mg elemental calcium ) chewable tablet 400 mg of elemental calcium   2 tablet Oral Daily PRN Smith, Rondell A, MD       diazepam  (VALIUM ) injection 2.5 mg  2.5 mg Intravenous Once PRN Harris, Abigail, PA-C       folic acid  (FOLVITE ) tablet 1 mg  1 mg Oral Daily Claudene, Rondell A, MD   1 mg at 08/10/23 9056   guaiFENesin  (MUCINEX ) 12 hr tablet 600 mg  600 mg Oral BID Smith, Rondell A, MD   600 mg at 08/10/23  9056   HYDROcodone -acetaminophen  (NORCO/VICODIN) 5-325 MG per tablet 1 tablet  1 tablet Oral Q6H PRN Smith, Rondell A, MD   1 tablet at 08/10/23 0420   HYDROmorphone  (DILAUDID ) injection 1 mg  1 mg Intravenous Q4H PRN Rai, Ripudeep K, MD   1 mg at 08/10/23 9060   ondansetron  (ZOFRAN ) tablet 4 mg  4 mg Oral Q6H PRN Claudene Reeves A, MD       Or   ondansetron  (ZOFRAN ) injection 4 mg  4 mg Intravenous Q6H PRN Claudene Reeves A, MD   4 mg at 08/10/23 1106   sodium chloride  flush (NS) 0.9 % injection 3 mL  3 mL Intravenous Q12H Smith, Rondell A, MD   3 mL at 08/10/23 9056   vortioxetine  HBr (TRINTELLIX ) tablet 5 mg  5 mg Oral Daily Smith, Rondell A, MD   5 mg at 08/10/23 9056   warfarin (COUMADIN ) tablet 10 mg  10 mg Oral Once per day on Sunday Monday Wednesday Friday Migdalia Feliciano RAMAN, [place]       warfarin (COUMADIN ) tablet 15 mg  15 mg Oral Once per day on Tuesday Thursday Saturday Migdalia Feliciano RAMAN, [place]   15 mg at 08/09/23 2222   Warfarin - Pharmacist Dosing Inpatient   Does not apply q1600 Migdalia Feliciano RAMAN, RPH        Allergies as of 08/09/2023   (No Known Allergies)    Family History  Problem Relation Age of Onset   Fibromyalgia Mother    Headache Mother    Neuropathy Father    Diabetes Father    Heart failure Father    Diabetes Brother    Breast cancer Maternal Grandmother    Colon cancer Paternal Grandfather    Liver disease Neg Hx    Esophageal cancer Neg Hx    Colon polyps Neg Hx    Rectal cancer Neg Hx    Stomach cancer Neg Hx     Social History   Socioeconomic History   Marital status: Married    Spouse name: Not on file   Number of children: 0   Years of education: Not on file   Highest education level: Bachelor's degree (e.g., BA, AB, BS)  Occupational History   Occupation: Engineer, agricultural  Tobacco Use   Smoking status: Never   Smokeless  tobacco: Never  Vaping Use   Vaping status: Never Used  Substance and Sexual Activity   Alcohol use: Yes    Comment: Rare    Drug use: Not Currently    Types: Marijuana    Comment: 12-28-2018  per pt last used 5 wks ago   Sexual activity: Not on file    Comment: husband had vasectomy  Other Topics Concern   Not on file  Social History Narrative   Are you right handed or left handed? Right    Are you currently employed ?    What is your current occupation?   Do you live at home alone?   Who lives with you?    What type of home do you live in: 1 story or 2 story? 2       Social Drivers of Corporate investment banker Strain: Low Risk  (05/12/2023)   Received from [facility]   Overall Financial Resource Strain (CARDIA)    Difficulty of Paying Living Expenses: Not hard at all  Food Insecurity: No Food Insecurity (08/09/2023)   Hunger Vital Sign    Worried About Running Out of Food in the Last Year: Never true    Ran Out of Food in the Last Year: Never true  Transportation Needs: No Transportation Needs (08/09/2023)   PRAPARE - Administrator, Civil Service (Medical): No    Lack of Transportation (Non-Medical): No  Physical Activity: Unknown (03/04/2023)   Exercise Vital Sign    Days of Exercise per Week: Patient declined    Minutes of Exercise per Session: Not on file  Stress: Stress Concern Present (03/04/2023)   [employer] of Occupational Health - Occupational Stress Questionnaire    Feeling of Stress : Very much  Social Connections: Unknown (03/04/2023)   Social Connection and Isolation Panel    Frequency of Communication with Friends and Family: Twice a week    Frequency of Social Gatherings with Friends and Family: Once a week    Attends Religious Services: Patient declined    Database administrator or Organizations: No    Attends Engineer, structural: Not on file    Marital Status: Married  Catering manager Violence: Not At Risk (08/09/2023)   Humiliation, Afraid, Rape, and Kick questionnaire    Fear of Current or Ex-Partner: No    Emotionally Abused:  No    Physically Abused: No    Sexually Abused: No    Review of Systems: Pertinent positive and negative review of systems were noted in the above HPI section.  All other review of systems was otherwise negative.   Physical Exam: Vital signs in last 24 hours: Temp:  [98.1 F (36.7 C)-98.5 F (36.9 C)] 98.1 F (36.7 C) (08/03 0821) Pulse Rate:  [74-91] 78 (08/03 0821) Resp:  [16-18] 18 (08/03 0821) BP: (94-109)/(62-70) 94/62 (08/03 0821) SpO2:  [99 %-100 %] 99 % (08/03 0821) Last BM Date : 08/07/23 General:   Alert,  Well-developed, well-nourished, white female pleasant and cooperative in NAD, husband at bedside Head:  Normocephalic and atraumatic. Eyes:  Sclera clear, no icterus.   Conjunctiva pink. Ears:  Normal auditory acuity. Nose:  No deformity, discharge,  or lesions. Mouth:  No deformity or lesions.   Neck:  Supple; no masses or thyromegaly. Lungs:  Clear throughout to auscultation.   No wheezes, crackles, or rhonchi.  Heart:  Regular rate and rhythm; no murmurs, clicks, rubs,  or gallops. Abdomen:  Soft,  she is tender across the epigastrium and hypogastrium, no guarding or rebound BS active,nonpalp mass or hsm.   Rectal: Not done Msk:  Symmetrical without gross deformities. . Pulses:  Normal pulses noted. Extremities:  Without clubbing or edema. Neurologic:  Alert and  oriented x4;  grossly normal neurologically. Skin:  Intact without significant lesions or rashes.. Psych:  Alert and cooperative. Normal mood and affect.  Intake/Output from previous day: 08/02 0701 - 08/03 0700 In: 2522.7 [P.O.:60; I.V.:1412.7; IV Piggyback:1050] Out: 350 [Urine:350] Intake/Output this shift: Total I/O In: -  Out: 600 [Urine:600]  Lab Results: Recent Labs    08/09/23 0924 08/10/23 0415  WBC 11.3* 11.5*  HGB 13.5 10.6*  HCT 38.1 30.6*  PLT 228 170   BMET Recent Labs    08/09/23 0924 08/10/23 0415  NA 140 138  K 3.6 3.9  CL 104 108  CO2 24 23  GLUCOSE 97 96  BUN  12 8  CREATININE 0.83 0.80  CALCIUM  9.9 8.0*   LFT Recent Labs    08/10/23 0415  PROT 5.9*  ALBUMIN 2.9*  AST 14*  ALT 15  ALKPHOS 61  BILITOT 1.1   PT/INR Recent Labs    08/09/23 0924 08/10/23 0415  LABPROT 28.4* 35.9*  INR 2.5* 3.4*   Hepatitis Panel No results for input(s): HEPBSAG, HCVAB, HEPAIGM, HEPBIGM in the last 72 hours.   IMPRESSION:  #15 38 year old white female presenting with acute upper abdominal pain and nausea in setting of known pancreas divisum with previous episodes of pancreatitis remotely for which she had undergone pancreatic duct stent in 2006/[place] . Patient has not had any episodes of pancreatitis in the interim until now. Interestingly that she had started on Zepbound in May 2025 for weight loss and states it is working well.  Workup in the ER consistent with acute pancreatitis, MRI/MRCP here shows diffuse interstitial pancreatitis with peripancreatic inflammation, no fluid collections, no evidence for necrosis, no ductal dilation, pancreatic duct normal, pancreas to be is not present and pancreatic duct stent not seen  Etiology of this episode of acute pancreatitis not certain though with recent initiation of Zepbound would be concerned that this may have precipitated. Unclear how long her pancreatic duct stent has been out, generally these are plastic stents and not meant to be permanent so may have been gone for years.  #2 antiphospholipid syndrome #3 CVA February 2024 on chronic Coumadin  #4 PCOS 5.  GERD 6.  Hypertension  Plan; continue liberal fluid hydration with lactated Ringer 's today Clear liquids as tolerates She is already improving, hopefully symptoms will resolve quickly and she will have a short course. Best advice is to stop zebound she is reluctant to stop as it is working for weight loss and she is aware that it is associated with risk for pancreatitis.  Check KUB to assess for any evidence of pancreatic duct  stent And has been asking about possibility of having another pancreatic duct stent placed.  That could be considered in the future, would not be indicated now in setting of acute pancreatitis.  We may repeat imaging as an outpatient in a couple of months and then decide if pancreatic duct stenting indicated.   Boyd Buffalo EsterwoodPA-C  08/10/2023, 11:30 AM

## 2023-08-11 ENCOUNTER — Telehealth: Payer: Self-pay

## 2023-08-11 DIAGNOSIS — E7800 Pure hypercholesterolemia, unspecified: Secondary | ICD-10-CM | POA: Diagnosis not present

## 2023-08-11 DIAGNOSIS — D6861 Antiphospholipid syndrome: Secondary | ICD-10-CM | POA: Diagnosis not present

## 2023-08-11 DIAGNOSIS — Z7901 Long term (current) use of anticoagulants: Secondary | ICD-10-CM | POA: Diagnosis not present

## 2023-08-11 DIAGNOSIS — K859 Acute pancreatitis without necrosis or infection, unspecified: Secondary | ICD-10-CM | POA: Diagnosis not present

## 2023-08-11 LAB — RENAL FUNCTION PANEL
Albumin: 2.8 g/dL — ABNORMAL LOW (ref 3.5–5.0)
Anion gap: 6 (ref 5–15)
BUN: <5 mg/dL — ABNORMAL LOW (ref 6–20)
CO2: 23 mmol/L (ref 22–32)
Calcium: 8.4 mg/dL — ABNORMAL LOW (ref 8.9–10.3)
Chloride: 109 mmol/L (ref 98–111)
Creatinine, Ser: 0.73 mg/dL (ref 0.44–1.00)
GFR, Estimated: >60 mL/min (ref >60)
Glucose, Bld: 86 mg/dL (ref 70–99)
Phosphorus: 2.1 mg/dL — ABNORMAL LOW (ref 2.5–4.6)
Potassium: 3.6 mmol/L (ref 3.5–5.1)
Sodium: 138 mmol/L (ref 135–145)

## 2023-08-11 LAB — CBC
HCT: 30.7 % — ABNORMAL LOW (ref 36.0–46.0)
Hemoglobin: 10.6 g/dL — ABNORMAL LOW (ref 12.0–15.0)
MCH: 27.5 pg (ref 26.0–34.0)
MCHC: 34.5 g/dL (ref 30.0–36.0)
MCV: 79.7 fL — ABNORMAL LOW (ref 80.0–100.0)
Platelets: 177 K/uL (ref 150–400)
RBC: 3.85 MIL/uL — ABNORMAL LOW (ref 3.87–5.11)
RDW: 14.4 % (ref 11.5–15.5)
WBC: 8 K/uL (ref 4.0–10.5)
nRBC: 0 % (ref 0.0–0.2)

## 2023-08-11 LAB — LIPASE, BLOOD: Lipase: 62 U/L — ABNORMAL HIGH (ref 11–51)

## 2023-08-11 LAB — PROTIME-INR
INR: 4 — ABNORMAL HIGH (ref 0.8–1.2)
Prothrombin Time: 40.6 s — ABNORMAL HIGH (ref 11.4–15.2)

## 2023-08-11 MED ORDER — WARFARIN SODIUM 5 MG PO TABS
5.0000 mg | ORAL_TABLET | Freq: Once | ORAL | Status: DC
  Filled 2023-08-11: qty 1

## 2023-08-11 MED ORDER — HYDROMORPHONE HCL 1 MG/ML IJ SOLN: 0.5000 mg | INTRAMUSCULAR | Status: DC | PRN

## 2023-08-11 MED ORDER — ONDANSETRON 4 MG PO TBDP: 4.0000 mg | ORAL_TABLET | Freq: Three times a day (TID) | ORAL | 0 refills | Status: DC | PRN

## 2023-08-11 MED ORDER — HYDROCODONE-ACETAMINOPHEN 5-325 MG PO TABS: 1.0000 | ORAL_TABLET | Freq: Four times a day (QID) | ORAL | 0 refills | Status: DC | PRN

## 2023-08-11 MED ORDER — POLYETHYLENE GLYCOL 3350 17 G PO PACK
17.0000 g | PACK | Freq: Once | ORAL | Status: AC
  Administered 2023-08-11: 17 g via ORAL
  Filled 2023-08-11: qty 1

## 2023-08-11 MED ORDER — WARFARIN SODIUM 5 MG PO TABS: ORAL_TABLET | ORAL | Status: DC

## 2023-08-11 NOTE — TOC CM/SW Note (Signed)
 Transition of Care [facility]) - Inpatient Brief Assessment   Patient Details  Name: Kristen Hoover MRN: [id number] Date of Birth: 26-Dec-1985  Transition of Care [facility]) CM/SW Contact:    Roxie KANDICE Stain, RN Phone Number: 08/11/2023, 9:43 AM   Clinical Narrative: Acute pancreatitis with prior History of pancreatitis. Patient on heart healthy diet. Plan to discharge home with spouse if patient can tolerate diet.   Transition of Care Asessment: Insurance and Status: Insurance coverage has been reviewed Patient has primary care physician: Yes Home environment has been reviewed: safe to discharge home Prior level of function:: independent Prior/Current Home Services: No current home services Social Drivers of Health Review: SDOH reviewed no interventions necessary Readmission risk has been reviewed: Yes Transition of care needs: no transition of care needs at this time

## 2023-08-11 NOTE — Plan of Care (Signed)

## 2023-08-11 NOTE — Progress Notes (Signed)
 Daily Progress Note  DOA: 08/09/2023 Hospital Day: 3 Primary GI:  Dr. Charlanne  Patient Profile:   38 year old female with a past medical history of antiphospholipid syndrome on chronic anticoagulation, CVA, PCOS, pancreatitis, GERD.     ASSESSMENT    Acute recurrent pancreatitis History of pancreatic divisum s/p remote procedure with stenting  ( ? Minor papillotomy) No evidence for biliary pancreatitis. Could be 2/2 to GLP ( Zepbound). 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 However these patients usually have ductal dilation which she doesn't have.  TODAY>> WBC 8, hct 31%, renal function normal. Lipase 62. Feels better compared to yesterday. No pain meds since  around 530 am but now starting to get some recurrent upper abdominal discomfort.   PCOS / obesity  Principal Problem:   Acute pancreatitis Active Problems:   Anxiety and depression   Long term (current) use of anticoagulants [Z79.01]   History of CVA (cerebrovascular accident)   Hyperlipidemia   Antiphospholipid syndrome (HCC)   Leukocytosis   PLAN   We have recommended patient discontinue GLPs.  Understandably this is a hard decision for her. She has PCOS / obesity and FMH of heart disease and is trying to be proactive with her health by losing weight. If she holds GLP but has continued bouts of pancreatitis then it is reasonable to consider repeat high risk ERCP for evaluation of her minor papilla to consider dilation of the minor papula versus further sphincterotomy if it has a stricture.    Subjective   Feels better compared to yesterday. No pain meds since  around 530 am but now starting to get some recurrent upper abdominal discomfort.    Objective   GI Studies:    Recent Labs    08/09/23 0924 08/10/23 0415 08/11/23 0557  WBC 11.3* 11.5* 8.0  HGB 13.5 10.6* 10.6*  HCT 38.1 30.6* 30.7*  MCV 78.9* 80.3 79.7*  PLT 228 170  177   No results for input(s): FOLATE, VITAMINB12, FERRITIN, TIBC, IRONPCTSAT in the last 72 hours. Recent Labs    08/09/23 0924 08/10/23 0415 08/11/23 0557  NA 140 138 138  K 3.6 3.9 3.6  CL 104 108 109  CO2 24 23 23   GLUCOSE 97 96 86  BUN 12 8 <5*  CREATININE 0.83 0.80 0.73  CALCIUM  9.9 8.0* 8.4*   Recent Labs    08/09/23 0924 08/10/23 0415 08/11/23 0557  PROT 7.5 5.9*  --   ALBUMIN 4.3 2.9* 2.8*  AST 20 14*  --   ALT 18 15  --   ALKPHOS 101 61  --   BILITOT 0.8 1.1  --       Imaging:  MR 3D Recon At Scanner CLINICAL DATA:  Acute pancreatitis. History of pancreatic divisum and stent placement.  EXAM: MRI ABDOMEN WITHOUT AND WITH CONTRAST (INCLUDING MRCP)  TECHNIQUE: Multiplanar multisequence MR imaging of the abdomen was performed both before and after the administration of intravenous contrast. Heavily T2-weighted images of the biliary and pancreatic ducts were obtained, and three-dimensional MRCP images were rendered by post processing.  CONTRAST:  9.5mL GADAVIST  GADOBUTROL  1 MMOL/ML IV SOLN  COMPARISON:  CT scan from 2020  FINDINGS: Lower chest: Very small pleural effusions and bibasilar atelectasis. No pericardial effusion. No lung lesions.  Hepatobiliary: No hepatic lesions or intrahepatic biliary dilatation. The gallbladder is unremarkable. Normal caliber and course of the common bile duct. No common bile duct stones  are identified.  Pancreas: Findings of acute interstitial pancreatitis with diffuse inflammation/edema throughout the pancreas and in the surrounding peripancreatic soft tissues. No discrete fluid collection or hematoma. Normal enhancement of the pancreatic parenchyma without findings for pancreatic necrosis. Normal caliber main pancreatic duct. Pancreatic divisum again noted. I do not see any definite MR findings for a pancreatic duct stent.  Spleen: Normal size. No splenic lesions. The splenic vein is  patent.  Adrenals/Urinary Tract:  The adrenal glands and kidneys are normal.  Stomach/Bowel: The stomach, duodenum, visualized small bowel and visualized colon are unremarkable.  Vascular/Lymphatic: The aorta and branch vessels are patent. The major venous structures are patent. Small scattered mesenteric and retroperitoneal lymph nodes but no mass or overt adenopathy.  Other:  No abdominal wall hernia or subcutaneous lesions.  Musculoskeletal: No significant bony findings.  IMPRESSION: 1. Findings of acute interstitial pancreatitis with diffuse inflammation/edema throughout the pancreas and in the surrounding peripancreatic soft tissues. No discrete fluid collection or hematoma. 2. Normal caliber and course of the common bile duct. No common bile duct stones are identified. 3. Pancreatic divisum again noted. I do not see any definite MR findings for a pancreatic duct stent. 4. Very small pleural effusions and bibasilar atelectasis.  Electronically Signed   By: MYRTIS Stammer M.D.   On: 08/11/2023 09:43     Scheduled inpatient medications:   aspirin   81 mg Oral Daily   atorvastatin   40 mg Oral Daily   busPIRone   15 mg Oral BID   folic acid   1 mg Oral Daily   guaiFENesin   600 mg Oral BID   sodium chloride  flush  3 mL Intravenous Q12H   vortioxetine  HBr  5 mg Oral Daily   warfarin  5 mg Oral Once   Warfarin - Pharmacist Dosing Inpatient   Does not apply q1600   Continuous inpatient infusions:  PRN inpatient medications: acetaminophen  **OR** acetaminophen , albuterol , ALPRAZolam , calcium  carbonate, HYDROcodone -acetaminophen , HYDROmorphone  (DILAUDID ) injection, ondansetron  **OR** ondansetron  (ZOFRAN ) IV  Vital signs in last 24 hours: Temp:  [97.7 F (36.5 C)-97.8 F (36.6 C)] 97.7 F (36.5 C) (08/04 0818) Pulse Rate:  [71] 71 (08/04 0818) Resp:  [16] 16 (08/03 2131) BP: (110-112)/(62-77) 110/62 (08/04 0818) SpO2:  [100 %] 100 % (08/04 0818) Last BM Date :  08/07/23  Intake/Output Summary (Last 24 hours) at 08/11/2023 1307 Last data filed at 08/11/2023 0700 Gross per 24 hour  Intake --  Output 700 ml  Net -700 ml    Intake/Output from previous day: 08/03 0701 - 08/04 0700 In: -  Out: 1300 [Urine:1300] Intake/Output this shift: No intake/output data recorded.   Physical Exam:  General: Alert female in NAD Heart:  Regular rate and rhythm.  Pulmonary: Normal respiratory effort Abdomen: Soft, nondistended, mild mid upper abdominal tenderness. Normal bowel sounds. Extremities: No lower extremity edema  Neurologic: Alert and oriented Psych: Pleasant. Cooperative     LOS: 2 days   Vina Dasen ,NP 08/11/2023, 1:07 PM

## 2023-08-11 NOTE — Discharge Summary (Signed)
 Physician Discharge Summary   Patient: Kristen Hoover MRN: [id number] DOB: [birth year]  Admit date:     08/09/2023  Discharge date: 08/11/23  Discharge Physician: Nydia Distance, MD    PCP: Kathrene Mardy HERO, PA-C   Recommendations at discharge:   Patient recommended to hold Zepbound until she discusses with GI or endocrinology Warfarin 5 mg p.o tonight and can resume her outpatient regimen tomorrow  Discharge Diagnoses:    Acute pancreatitis   Leukocytosis   Long term (current) use of anticoagulants [Z79.01]   History of CVA (cerebrovascular accident)   Antiphospholipid syndrome (HCC)   Hyperlipidemia   Anxiety and depression Obesity class I  Hospital Course:  Patient is a 38 y.o. female with medical history significant of hypertension,  CVA, antiphospholipid syndrome on warfarin,  small PFO, PCOS, pancreatitis , anxiety, depression, and GERD presented with epigastric pain, typical of pancreatitis. She had previous episodes of pancreatitis which was related to pancreatic divisum requiring stenting back in 2006  No recent alcohol consumption except for a glass of wine a week ago while on vacation. No diarrhea but constipation with occasional red blood in the stool, attributed to hemorrhoids.  She is also on Zepbound.     Assessment and Plan:   Acute pancreatitis with prior History of pancreatitis - Prior history of pancreatitis related to pancreatic divisum requiring stent in 2006 - lipase 1764-> 155.  - GI consulted, MRCP consistent with acute pancreatitis, gall bladder unremarkable, no CBD stones, pancreatic divisum but no MR findings for pancreatic duct stent. -  She is on Zepbound, ?stent failure vs GLP 1 related pancreatitis (first episode since 2006) and has been on GLP1 since last year.      - TG 81 - lipase 1764-> 155-> 62.  -Seen by gastroenterology, Dr. 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  Recommended to hold Zepbound or other GLP's.  If she has continued bouts of pancreatitis while being off GLP's, reasonable to then consider ERCP if for further evaluation. -Tolerating solid diet without difficulty   Leukocytosis - likely due to above, no necrosis    History of CVA Antiphospholipid syndrome on chronic anticoagulation - Continue aspirin , statin, and Coumadin  INR 4.0, recommended to decrease dose to 5 mg tonight and continue home regimen tomorrow Outpatient follow-up of PT/INR in next 3 to 4 days   Hyperlipidemia - Continue atorvastatin    Anxiety and depression - Continue Trintellix , BuSpar , and Ativan as needed   Obesity class I Estimated body mass index is 33.09 kg/m as calculated from the following:   Height as of 06/03/23: 5' 6 (1.676 m).   Weight as of 07/16/23: 93 kg.      Pain control - [place]  Controlled Substance Reporting System database was reviewed. and patient was instructed, not to drive, operate heavy machinery, perform activities at heights, swimming or participation in water activities or provide baby-sitting services while on Pain, Sleep and Anxiety Medications; until their outpatient Physician has advised to do so again. Also recommended to not to take more than prescribed Pain, Sleep and Anxiety Medications.  Consultants: GI Procedures performed: MRCP Disposition: Home Diet recommendation:  Discharge Diet Orders (From admission, onward)     Start     Ordered   08/11/23 0000  Diet - low sodium heart healthy        08/11/23 1300  DISCHARGE MEDICATION: Allergies as of 08/11/2023   No Known Allergies      Medication List     PAUSE taking these medications    metFORMIN  750 MG 24 hr tablet Wait to take this until: August 12, 2023 Commonly known as: GLUCOPHAGE -XR TAKE 1 TABLET BY MOUTH 2 TIMES DAILY.   Zepbound 5 MG/0.5ML  Pen Wait to take this until your doctor or other care provider tells you to start again. Generic drug: tirzepatide Inject 5 mg into the skin every Friday.       STOP taking these medications    acetaminophen  500 MG tablet Commonly known as: TYLENOL        TAKE these medications    ALPRAZolam  0.5 MG tablet Commonly known as: XANAX  Take 1/2 to 1 tab po BID prn for acute panic. What changed:  how much to take how to take this when to take this reasons to take this additional instructions   aspirin  EC 81 MG tablet Take 81 mg by mouth daily.   atorvastatin  40 MG tablet Commonly known as: LIPITOR Take 1 tablet (40 mg total) by mouth daily.   busPIRone  15 MG tablet Commonly known as: BUSPAR  Take 15 mg by mouth 2 (two) times daily.   FISH OIL PO Take 1 capsule by mouth daily.   folic acid  1 MG tablet Commonly known as: FOLVITE  Take 1 mg by mouth daily.   HYDROcodone -acetaminophen  5-325 MG tablet Commonly known as: NORCO/VICODIN Take 1 tablet by mouth every 6 (six) hours as needed for moderate pain (pain score 4-6) or severe pain (pain score 7-10).   levocetirizine 5 MG tablet Commonly known as: XYZAL  TAKE 1 TABLET (5 MG TOTAL) BY MOUTH IN THE MORNING AND AT BEDTIME   ondansetron  4 MG disintegrating tablet Commonly known as: ZOFRAN -ODT Take 1 tablet (4 mg total) by mouth every 8 (eight) hours as needed for nausea or vomiting. What changed: See the new instructions.   spironolactone  50 MG tablet Commonly known as: ALDACTONE  TAKE 1 TABLET BY MOUTH EVERY DAY   Trintellix  5 MG Tabs tablet Generic drug: vortioxetine  HBr Take 5 mg by mouth daily.   TUMS PO Take 2 tablets by mouth 2 (two) times daily as needed (stomach pain).   TURMERIC (CURCUMIN) PO Take 1 tablet by mouth daily.   VITAMIN D -3 PO Take 1 capsule by mouth daily.   warfarin 5 MG tablet Commonly known as: COUMADIN  Take as directed. If you are unsure how to take this medication, talk to your  nurse or doctor. Original instructions: TAKE 3 TABLETS BY MOUTH DAILY EXCEPT 3 1/2 TABLETS ON WEDNESDAYS OR AS DIRECTED BY ANTICOAGULATION CLINIC  Take 5 mg warfarin (1 tab) tonight, resume your regular dosing tomorrow What changed: additional instructions        Follow-up Information     Allwardt, Alyssa M, PA-C. Schedule an appointment as soon as possible for a visit in 2 week(s).   Specialty: Physician Assistant Why: for hospital follow-up Contact information: [address] [place] [address] [phone]                Discharge Exam: S: No acute complaints, tolerating diet  BP 110/62   Pulse 71   Temp 97.7 F (36.5 C)   Resp 16   SpO2 100%   Physical Exam General: Alert and oriented x 3, NAD Cardiovascular: S1 S2 clear, RRR.  Respiratory: CTAB, no wheezing, rales or rhonchi Gastrointestinal: Soft, nontender, nondistended, NBS Ext: no pedal edema bilaterally  Neuro: no new deficits Psych: Normal affect    Condition at discharge: fair  The results of significant diagnostics from this hospitalization (including imaging, microbiology, ancillary and laboratory) are listed below for reference.   Imaging Studies: MR 3D Recon At Scanner Result Date: 08/11/2023 CLINICAL DATA:  Acute pancreatitis. History of pancreatic divisum and stent placement. EXAM: MRI ABDOMEN WITHOUT AND WITH CONTRAST (INCLUDING MRCP) TECHNIQUE: Multiplanar multisequence MR imaging of the abdomen was performed both before and after the administration of intravenous contrast. Heavily T2-weighted images of the biliary and pancreatic ducts were obtained, and three-dimensional MRCP images were rendered by post processing. CONTRAST:  9.5mL GADAVIST  GADOBUTROL  1 MMOL/ML IV SOLN COMPARISON:  CT scan from 2020 FINDINGS: Lower chest: Very small pleural effusions and bibasilar atelectasis. No pericardial effusion. No lung lesions. Hepatobiliary: No hepatic lesions or intrahepatic biliary dilatation.  The gallbladder is unremarkable. Normal caliber and course of the common bile duct. No common bile duct stones are identified. Pancreas: Findings of acute interstitial pancreatitis with diffuse inflammation/edema throughout the pancreas and in the surrounding peripancreatic soft tissues. No discrete fluid collection or hematoma. Normal enhancement of the pancreatic parenchyma without findings for pancreatic necrosis. Normal caliber main pancreatic duct. Pancreatic divisum again noted. I do not see any definite MR findings for a pancreatic duct stent. Spleen: Normal size. No splenic lesions. The splenic vein is patent. Adrenals/Urinary Tract:  The adrenal glands and kidneys are normal. Stomach/Bowel: The stomach, duodenum, visualized small bowel and visualized colon are unremarkable. Vascular/Lymphatic: The aorta and branch vessels are patent. The major venous structures are patent. Small scattered mesenteric and retroperitoneal lymph nodes but no mass or overt adenopathy. Other:  No abdominal wall hernia or subcutaneous lesions. Musculoskeletal: No significant bony findings. IMPRESSION: 1. Findings of acute interstitial pancreatitis with diffuse inflammation/edema throughout the pancreas and in the surrounding peripancreatic soft tissues. No discrete fluid collection or hematoma. 2. Normal caliber and course of the common bile duct. No common bile duct stones are identified. 3. Pancreatic divisum again noted. I do not see any definite MR findings for a pancreatic duct stent. 4. Very small pleural effusions and bibasilar atelectasis. Electronically Signed   By: MYRTIS Stammer M.D.   On: 08/11/2023 09:43   DG Abd 1 View Result Date: 08/10/2023 EXAM: 1 VIEW XRAY OF THE ABDOMEN 08/10/2023 01:22:00 PM COMPARISON: None available. CLINICAL HISTORY: Foreign body in alimentary tract. FINDINGS: BOWEL: Nonobstructive bowel gas pattern. There is no foreign body demonstrated. SOFT TISSUES: No abnormal calcifications or opaque  urinary calculi. BONES: No acute osseous abnormality. IMPRESSION: 1. No foreign body demonstrated. Electronically signed by: evalene coho 08/10/2023 01:57 PM EDT RP Workstation: [id number]   MR ABDOMEN MRCP W WO CONTAST Result Date: 08/09/2023 CLINICAL DATA:  Acute pancreatitis. History of pancreatic divisum and stent placement. EXAM: MRI ABDOMEN WITHOUT AND WITH CONTRAST (INCLUDING MRCP) TECHNIQUE: Multiplanar multisequence MR imaging of the abdomen was performed both before and after the administration of intravenous contrast. Heavily T2-weighted images of the biliary and pancreatic ducts were obtained, and three-dimensional MRCP images were rendered by post processing. CONTRAST:  9.5mL GADAVIST  GADOBUTROL  1 MMOL/ML IV SOLN COMPARISON:  CT scan from 2020 FINDINGS: Lower chest: Very small pleural effusions and bibasilar atelectasis. No pericardial effusion. No lung lesions. Hepatobiliary: No hepatic lesions or intrahepatic biliary dilatation. The gallbladder is unremarkable. Normal caliber and course of the common bile duct. No common bile duct stones are identified. Pancreas: Findings of acute interstitial pancreatitis with diffuse inflammation/edema throughout the pancreas and in the  surrounding peripancreatic soft tissues. No discrete fluid collection or hematoma. Normal enhancement of the pancreatic parenchyma without findings for pancreatic necrosis. Normal caliber main pancreatic duct. Pancreatic divisum again noted. I do not see any definite MR findings for a pancreatic duct stent. Spleen: Normal size. No splenic lesions. The splenic vein is patent. Adrenals/Urinary Tract:  The adrenal glands and kidneys are normal. Stomach/Bowel: The stomach, duodenum, visualized small bowel and visualized colon are unremarkable. Vascular/Lymphatic: The aorta and branch vessels are patent. The major venous structures are patent. Small scattered mesenteric and retroperitoneal lymph nodes but no mass or overt  adenopathy. Other:  No abdominal wall hernia or subcutaneous lesions. Musculoskeletal: No significant bony findings. IMPRESSION: 1. Findings of acute interstitial pancreatitis with diffuse inflammation/edema throughout the pancreas and in the surrounding peripancreatic soft tissues. No discrete fluid collection or hematoma. 2. Normal caliber and course of the common bile duct. No common bile duct stones are identified. 3. Pancreatic divisum again noted. I do not see any definite MR findings for a pancreatic duct stent. 4. Very small pleural effusions and bibasilar atelectasis. Electronically Signed   By: MYRTIS Stammer M.D.   On: 08/09/2023 18:10    Microbiology: Results for orders placed or performed in visit on 08/30/20  Novel Coronavirus, NAA ([facility])     Status: None   Collection Time: 08/30/20  3:51 PM   Specimen: Nasopharyngeal(NP) swabs in vial transport medium   Nasopharynge  Testing  Result Value Ref Range Status   SARS-CoV-2, NAA Not Detected Not Detected Final    Comment: This nucleic acid amplification test was developed and its performance characteristics determined by [employer]. Nucleic acid amplification tests include RT-PCR and TMA. This test has not been FDA cleared or approved. This test has been authorized by FDA under an Emergency Use Authorization (EUA). This test is only authorized for the duration of time the declaration that circumstances exist justifying the authorization of the emergency use of in vitro diagnostic tests for detection of SARS-CoV-2 virus and/or diagnosis of COVID-19 infection under section 564(b)(1) of the Act, 21 U.S.C. 639aaa-6(a) (1), unless the authorization is terminated or revoked sooner. When diagnostic testing is negative, the possibility of a false negative result should be considered in the context of a patient's recent exposures and the presence of clinical signs and symptoms consistent with COVID-19. An individual without  symptoms of COVID-19 and who is not shedding SARS-CoV-2 virus wo uld expect to have a negative (not detected) result in this assay.   SARS-COV-2, NAA 2 DAY TAT     Status: None   Collection Time: 08/30/20  3:51 PM   Nasopharynge  Testing  Result Value Ref Range Status   SARS-CoV-2, NAA 2 DAY TAT Performed  Final  Culture, Group A Strep     Status: None   Collection Time: 08/30/20  4:03 PM   Specimen: Throat  Result Value Ref Range Status   MICRO NUMBER: CANCELED      Comment: Result canceled by the ancillary.   SPECIMEN QUALITY: CANCELED      Comment: Result canceled by the ancillary.   SOURCE: CANCELED      Comment: Result canceled by the ancillary.   STATUS: CANCELED      Comment: Result canceled by the ancillary.   RESULT: CANCELED      Comment: Result canceled by the ancillary.    Labs: CBC: Recent Labs  Lab 08/09/23 0924 08/10/23 0415 08/11/23 0557  WBC 11.3* 11.5* 8.0  NEUTROABS 8.8*  --   --  HGB 13.5 10.6* 10.6*  HCT 38.1 30.6* 30.7*  MCV 78.9* 80.3 79.7*  PLT 228 170 177   Basic Metabolic Panel: Recent Labs  Lab 08/09/23 0924 08/10/23 0415 08/11/23 0557  NA 140 138 138  K 3.6 3.9 3.6  CL 104 108 109  CO2 24 23 23   GLUCOSE 97 96 86  BUN 12 8 <5*  CREATININE 0.83 0.80 0.73  CALCIUM  9.9 8.0* 8.4*  PHOS  --   --  2.1*   Liver Function Tests: Recent Labs  Lab 08/09/23 0924 08/10/23 0415 08/11/23 0557  AST 20 14*  --   ALT 18 15  --   ALKPHOS 101 61  --   BILITOT 0.8 1.1  --   PROT 7.5 5.9*  --   ALBUMIN 4.3 2.9* 2.8*   CBG: No results for input(s): GLUCAP in the last 168 hours.  Discharge time spent: greater than 30 minutes.  Signed: Nydia Distance, MD [facility] 08/11/2023

## 2023-08-11 NOTE — Progress Notes (Addendum)
 PHARMACY - ANTICOAGULATION CONSULT NOTE  Pharmacy Consult for warfarin Indication: Antiphospholipid syndrome on chronic anticoagulation, history of CVA  No Known Allergies  Vital Signs: Temp: 97.7 F (36.5 C) (08/04 0818) BP: 110/62 (08/04 0818) Pulse Rate: 71 (08/04 0818)  Labs: Recent Labs    08/09/23 0924 08/10/23 0415 08/11/23 0557  HGB 13.5 10.6* 10.6*  HCT 38.1 30.6* 30.7*  PLT 228 170 177  LABPROT 28.4* 35.9* 40.6*  INR 2.5* 3.4* 4.0*  CREATININE 0.83 0.80 0.73    CrCl cannot be calculated (Unknown ideal weight.).  Medical History: Past Medical History:  Diagnosis Date   Anxiety and depression    Clotting disorder (HCC)    Frequency of urination    GERD (gastroesophageal reflux disease)    Heart murmur    History of blood clots    History of pancreatitis    12-28-2018-  last episode 2006   Hypertension    PCOS (polycystic ovarian syndrome)    Renal calculus, right    Stroke [facility])    Urgency of urination    Wears glasses    Assessment: Home Dose: 15 mg Tuesday, Thursday, Saturday; 10 mg all other days  Significant drug interactions: none identified   8/2: INR = 2.5 8/3: INR = 3.4 8/4: INR = 4.0   Pharmacy consulted to manage warfarin during inpatient admission. Patient presented to the hospital on 08/09/23 with a chief complaint of upper abdominal pain and vomiting. Patient's warfarin home regimen (patient reports taking warfarin doses at 2100), indication, and goal INR was confirmed via chart review of recent outpatient anti-coag visit (07/28/23). INR upon admission was therapeutic at 2.5.   Today's INR is supratherapeutic at 4.0. Increase in INR is likely due to less PO intake over the last few days. Diet increased today to Heart Diet from full liquids.   Goal of Therapy:  INR 2-3 Monitor platelets by anticoagulation protocol: Yes   Plan:  Monitor daily INR.  Warfarin 5 mg dose today (1/2 of home dose), suspect that dose holding on 8/3 and  resumption of diet will result in INR decreasing tomorrow   Rankin Sams, PharmD, BCPS, BCCCP Clinical Pharmacist  Addendum  Per conversation with Dr. Davia, patient will likely discharge today. Recommend 5 mg tonight and then resuming home regimen tomorrow with outpatient follow-up later this week given resumption of normal diet / intake.   Rankin Sams, PharmD, BCPS, BCCCP Clinical Pharmacist

## 2023-08-11 NOTE — TOC Transition Note (Signed)
 Transition of Care [facility]) - Discharge Note   Patient Details  Name: Kristen Hoover MRN: [id number] Date of Birth: [birth year]  [facility]) CM/SW Contact:  Roxie KANDICE Stain, RN Phone Number: 08/11/2023, 1:42 PM   Clinical Narrative:    [name] is stable to discharge home.  No TOC needs at this time.    Final next level of care: Home/Self Care Barriers to Discharge: Barriers Resolved   Patient Goals and CMS Choice Patient states their goals for this hospitalization and ongoing recovery are:: return home          Discharge Placement               home        Discharge Plan and Services Additional resources added to the After Visit Summary for                                       Social Drivers of Health (SDOH) Interventions SDOH Screenings   Food Insecurity: No Food Insecurity (08/09/2023)  Housing: Low Risk  (08/09/2023)  Transportation Needs: No Transportation Needs (08/09/2023)  Utilities: Not At Risk (08/09/2023)  Alcohol Screen: Low Risk  (03/04/2023)  Depression (PHQ2-9): Low Risk  (06/03/2023)  Financial Resource Strain: Low Risk  (05/12/2023)   Received from [facility]  Physical Activity: Unknown (03/04/2023)  Social Connections: Unknown (03/04/2023)  Stress: Stress Concern Present (03/04/2023)  Tobacco Use: Low Risk  (06/13/2023)     Readmission Risk Interventions    08/11/2023    1:41 PM  Readmission Risk Prevention Plan  Post Dischage Appt Complete  Medication Screening Complete  Transportation Screening Complete

## 2023-08-11 NOTE — Telephone Encounter (Signed)
 Pt was hospitalized on 8/2 after going to ER for abdominal pain. Pt was diagnosed with pancreatitis and is currently still inpt. INR was 2.5 on admission but unfortunately had trended up the last two days to 3.4 and 4.0 today. They are holding warfarin. Pt reports she has a hx of pancreatitis. She thinks she will be d/c today or tomorrow. She inquired when she's should test INR, since she tests at home. Advised this nurse will f/u with her tomorrow. If she is d/c tomorrow, there could be another INR drawn. Would like to see if INR is still trending up with holding warfarin.  Advised this nurse will contact her tomorrow to discuss dosing after d/c and also next retest date. Advised if anything is needed before then feel free to contact the coumadin  clinic. Pt verbalized understanding.

## 2023-08-12 ENCOUNTER — Telehealth: Payer: Self-pay

## 2023-08-12 ENCOUNTER — Ambulatory Visit (INDEPENDENT_AMBULATORY_CARE_PROVIDER_SITE_OTHER)

## 2023-08-12 DIAGNOSIS — Z7901 Long term (current) use of anticoagulants: Secondary | ICD-10-CM

## 2023-08-12 LAB — POCT INR: INR: 3.8 — AB (ref 2.0–3.0)

## 2023-08-12 NOTE — Transitions of Care (Post Inpatient/ED Visit) (Signed)
 08/12/2023  Name: Kristen Hoover MRN: [id number] DOB: 1985-01-14  Today's TOC FU Call Status: Today's TOC FU Call Status:: Successful TOC FU Call Completed TOC FU Call Complete Date: 08/12/23 Patient's Name and Date of Birth confirmed.  Transition Care Management Follow-up Telephone Call Date of Discharge: 08/11/23 Discharge Facility: [facility]) Type of Discharge: Inpatient Admission Primary Inpatient Discharge Diagnosis:: pancreatitis How have you been since you were released from the hospital?: Better Any questions or concerns?: No  Items Reviewed: Did you receive and understand the discharge instructions provided?: Yes Medications obtained,verified, and reconciled?: Yes (Medications Reviewed) Any new allergies since your discharge?: No Dietary orders reviewed?: Yes Do you have support at home?: Yes People in Home [RPT]: child(ren), adult, spouse  Medications Reviewed Today: Medications Reviewed Today     Reviewed by Emmitt Pan, LPN (Licensed Practical Nurse) on 08/12/23 at [phone]  Med List Status: <None>   Medication Order Taking? Sig Documenting Provider Last Dose Status Informant    Discontinued 08/11/23 1438 (Stop Taking at Discharge)    Discontinued 08/11/23 1938 (Patient Discharge)      Discontinued 08/11/23 1938 (Patient Discharge)      Discontinued 08/11/23 1938 (Patient Discharge)   ALPRAZolam  (XANAX ) 0.5 MG tablet [id number] Yes Take 1/2 to 1 tab po BID prn for acute panic.  Patient taking differently: Take 0.25 mg by mouth daily as needed for anxiety.   Allwardt, Mardy HERO, PA-C  Active Self, Pharmacy Records     Discontinued 08/11/23 1938 (Patient Discharge)      Discontinued 08/11/23 1938 (Patient Discharge)   aspirin  EC 81 MG tablet [id number] Yes Take 81 mg by mouth daily. [provider]  Active Self, Pharmacy Records  atorvastatin  (LIPITOR) 40 MG tablet [id number] Yes Take 1 tablet (40 mg total) by mouth daily. Allwardt, Mardy HERO,  PA-C  Active Self, Pharmacy Records     Discontinued 08/11/23 1938 (Patient Discharge)   busPIRone  (BUSPAR ) 15 MG tablet [id number] Yes Take 15 mg by mouth 2 (two) times daily. [provider]  Active Self, Pharmacy Records     Discontinued 08/11/23 1938 (Patient Discharge)      Discontinued 08/11/23 1938 (Patient Discharge)   Calcium  Carbonate Antacid (TUMS PO) [id number] Yes Take 2 tablets by mouth 2 (two) times daily as needed (stomach pain). [provider]  Active Self, Pharmacy Records  Cholecalciferol (VITAMIN D -3 PO) [id number] Yes Take 1 capsule by mouth daily. [provider]  Active Self, Pharmacy Records  folic acid  (FOLVITE ) 1 MG tablet [id number] Yes Take 1 mg by mouth daily. [provider]  Active Self, Pharmacy Records           Med Note CHRISTIE ALEXANDER   Tue Mar 19, 2022  4:14 PM)       Discontinued 08/11/23 1938 (Patient Discharge)      Discontinued 08/11/23 1938 (Patient Discharge)      Discontinued 08/11/23 1938 (Patient Discharge)   HYDROcodone -acetaminophen  (NORCO/VICODIN) 5-325 MG tablet [id number] Yes Take 1 tablet by mouth every 6 (six) hours as needed for moderate pain (pain score 4-6) or severe pain (pain score 7-10). Davia Nydia POUR, MD  Active      Discontinued 08/11/23 1938 (Patient Discharge)   levocetirizine (XYZAL ) 5 MG tablet [id number] Yes TAKE 1 TABLET (5 MG TOTAL) BY MOUTH IN THE MORNING AND AT BEDTIME Iva Marty Saltness, MD  Active Self, Pharmacy Records  metFORMIN  (GLUCOPHAGE -XR) 750 MG 24 hr tablet [id number]  TAKE 1 TABLET BY MOUTH 2 TIMES DAILY.  Patient not taking: Reported on 08/12/2023   Allwardt, Mardy HERO, PA-C  Active Self, Pharmacy Records  Misc [address] (TURMERIC, CURCUMIN, PO) [id number] Yes Take 1 tablet by mouth daily. [provider]  Active Self, Pharmacy Records  Omega-3 Fatty Acids (FISH OIL PO) [id number] Yes Take 1 capsule by mouth daily. [provider]  Active Self, Pharmacy  Records     Discontinued 08/11/23 1938 (Patient Discharge)      Discontinued 08/11/23 1938 (Patient Discharge)   ondansetron  (ZOFRAN -ODT) 4 MG disintegrating tablet [id number] Yes Take 1 tablet (4 mg total) by mouth every 8 (eight) hours as needed for nausea or vomiting. Davia Nydia POUR, MD  Active      Discontinued 08/11/23 1938 (Patient Discharge)   spironolactone  (ALDACTONE ) 50 MG tablet [id number] Yes TAKE 1 TABLET BY MOUTH EVERY DAY Allwardt, Mardy HERO, PA-C  Active Self, Pharmacy Records  tirzepatide (ZEPBOUND) 5 MG/0.5ML Pen [id number]  Inject 5 mg into the skin every Friday.  Patient not taking: Reported on 08/12/2023   [provider]  Active Self, Pharmacy Records           Med Note GERI JON HERO   Sat Aug 09, 2023  1:43 PM) Missed injection on 8/1 due to abdominal pain  TRINTELLIX  5 MG TABS tablet [id number] Yes Take 5 mg by mouth daily. [provider]  Active Self, Pharmacy Records     Discontinued 08/11/23 1938 (Patient Discharge)   warfarin (COUMADIN ) 5 MG tablet [id number] Yes TAKE 3 TABLETS BY MOUTH DAILY EXCEPT 3 1/2 TABLETS ON WEDNESDAYS OR AS DIRECTED BY ANTICOAGULATION CLINIC  Take 5 mg warfarin (1 tab) tonight, resume your regular dosing tomorrow Rai, Nydia POUR, MD  Active      Discontinued 08/11/23 1938 (Patient Discharge)      Discontinued 08/11/23 1938 (Patient Discharge)             Home Care and Equipment/Supplies: Were Home Health Services Ordered?: NA Any new equipment or medical supplies ordered?: NA  Functional Questionnaire: Do you need assistance with bathing/showering or dressing?: No Do you need assistance with meal preparation?: No Do you need assistance with eating?: No Do you have difficulty maintaining continence: No Do you need assistance with getting out of bed/getting out of a chair/moving?: No Do you have difficulty managing or taking your medications?: No  Follow up appointments reviewed: PCP Follow-up appointment  confirmed?: Yes Date of PCP follow-up appointment?: 08/13/23 Follow-up Provider[facility] Follow-up appointment confirmed?: NA Do you need transportation to your follow-up appointment?: No Do you understand care options if your condition(s) worsen?: Yes-patient verbalized understanding    SIGNATURE Julian Lemmings, LPN [facility] Nurse Health Advisor Direct Dial [phone]

## 2023-08-12 NOTE — Telephone Encounter (Signed)
-----   Message from [facility] sent at 08/12/2023  4:43 PM EDT ----- JMP, She has multiple comorbidities that weight loss certainly would be helpful for her. Distress hard to consider wanting to put her through risk of pancreatitis with pancreatic ERCP when the pancreas duct looks okay. We could offer her EUS in a few weeks time once she hopefully is healed up, and I am happy to do that. She can follow-up with Dr. Legrand or myself if she is established with him previously) but I was not too excited about an ERCP as of yet.   Certainly, another episode of pancreatitis or if at time of EUS (if she agrees) we see a minor papilloma that is stenosed, we could consider offering at that point. Thanks. GM  Jary Louvier, Please offer this patient a EUS with me in 4 to 8 weeks upper evaluate idiopathic pancreatitis history of pancreas divisum (if she agrees). Follow-up in clinic with Dr. Legrand or myself thereafter. Thanks. GM ----- Message ----- From: Albertus Gordy HERO, MD Sent: 08/11/2023   1:42 PM EDT To: Odetta LITTIE Curly, RN; Aloha Wilhelmenia Raddle., #  GM, Would you be willing to see this pt for followup. She brings up some good points about her panc divisum and not wanting to stop GLP1 unless absolutely necessary Clinically much better today Would she benefit from EUS to exclude PD abnormalities, stenoses, etc?  Obviously your expertise here, just asking. She will go home today and stay off Zepbound for now [facility]

## 2023-08-12 NOTE — Telephone Encounter (Signed)
 Pt recently hospitalized for pancreatitis and was d/c yesterday. Her INR on intake on 8/2 was 2.5 but increased the following two days to 3.4 and then 4.0 yesterday.   Pt reports she was advised to take 1 tablet last night when she was d/c. She normally would take 2 tablets.   Further documentation in anticoagulation encounter today.

## 2023-08-12 NOTE — Progress Notes (Signed)
 Pt tests INR at home. She tested while on the phone today. INR today is 3.8 Pt recently hospitalized for pancreatitis and was d/c yesterday. Her INR on intake on 8/2 was 2.5 but increased the following two days to 3.4 and then 4.0 yesterday.  Pt reports she was advised to take 1 tablet last night when she was d/c. She normally would take 2 tablets.  Pt was in range on the prior dosing when entering the hospital. Will try this dosing again and recheck in one week.  Hold dose today and then continue 2 tablets daily except take 3 tablets on Tuesday,  Thursday and Saturday. Recheck in 1 week, on 8/11. Contacted pt and advised of dosing and retest date. Pt verbalized understanding.

## 2023-08-12 NOTE — Patient Instructions (Addendum)
 Pre visit review using our clinic review tool, if applicable. No additional management support is needed unless otherwise documented below in the visit note.  Hold dose today and then continue 2 tablets daily except take 3 tablets on Tuesday,  Thursday and Saturday. Recheck in 1 week, on 8/11.

## 2023-08-13 ENCOUNTER — Encounter: Payer: Self-pay | Admitting: Physician Assistant

## 2023-08-13 ENCOUNTER — Ambulatory Visit: Admitting: Physician Assistant

## 2023-08-13 VITALS — BP 104/60 | HR 122 | Temp 97.3°F | Ht 66.0 in | Wt 195.2 lb

## 2023-08-13 DIAGNOSIS — D649 Anemia, unspecified: Secondary | ICD-10-CM

## 2023-08-13 DIAGNOSIS — K859 Acute pancreatitis without necrosis or infection, unspecified: Secondary | ICD-10-CM | POA: Diagnosis not present

## 2023-08-13 LAB — CBC WITH DIFFERENTIAL/PLATELET
Basophils Absolute: 0.1 K/uL (ref 0.0–0.1)
Basophils Relative: 0.7 % (ref 0.0–3.0)
Eosinophils Absolute: 0.1 K/uL (ref 0.0–0.7)
Eosinophils Relative: 1.6 % (ref 0.0–5.0)
HCT: 36.1 % (ref 36.0–46.0)
Hemoglobin: 12.4 g/dL (ref 12.0–15.0)
Lymphocytes Relative: 17.4 % (ref 12.0–46.0)
Lymphs Abs: 1.3 K/uL (ref 0.7–4.0)
MCHC: 34.5 g/dL (ref 30.0–36.0)
MCV: 79.3 fl (ref 78.0–100.0)
Monocytes Absolute: 0.6 K/uL (ref 0.1–1.0)
Monocytes Relative: 7.6 % (ref 3.0–12.0)
Neutro Abs: 5.5 K/uL (ref 1.4–7.7)
Neutrophils Relative %: 72.7 % (ref 43.0–77.0)
Platelets: 258 K/uL (ref 150.0–400.0)
RBC: 4.55 Mil/uL (ref 3.87–5.11)
RDW: 14.8 % (ref 11.5–15.5)
WBC: 7.6 K/uL (ref 4.0–10.5)

## 2023-08-13 LAB — LIPASE: Lipase: 103 U/L — ABNORMAL HIGH (ref 11.0–59.0)

## 2023-08-13 LAB — IBC + FERRITIN
Ferritin: 78.3 ng/mL (ref 10.0–291.0)
Iron: 25 ug/dL — ABNORMAL LOW (ref 42–145)
Saturation Ratios: 9.4 % — ABNORMAL LOW (ref 20.0–50.0)
TIBC: 264.6 ug/dL (ref 250.0–450.0)
Transferrin: 189 mg/dL — ABNORMAL LOW (ref 212.0–360.0)

## 2023-08-13 NOTE — Progress Notes (Signed)
 Patient ID: Kristen Hoover, female    DOB: 1985-11-25, 38 y.o.   MRN: [id number]   Assessment & Plan:  Acute pancreatitis without infection or necrosis, unspecified pancreatitis type -     CBC with Differential/Platelet -     Lipase  Low hemoglobin -     CBC with Differential/Platelet -     IBC + Ferritin     Assessment & Plan Acute pancreatitis in patient with pancreas divisum and recent GLP-1 agonist exposure Recent hospitalization for acute pancreatitis, likely related to GLP-1 agonist (Zepbound) use or pancreas divisum. Pain persists, fluctuating between 4-6/10, radiating to the back. MRI showed absence of previously placed stent. Differential includes GLP-1 agonist-induced pancreatitis versus anatomical cause due to pancreas divisum. Endocrinologist and GI specialist involved in management. She values continued use of Zepbound for cardiovascular and diabetes risk reduction despite potential risk of recurrent pancreatitis. - Hold Zepbound until September 1st, then restart at 2.5 mg. - Refer to GI for upper endoscopy to assess pancreatic duct anatomy. - Monitor for recurrence of pancreatitis symptoms.  Constipation associated with opioid use and recent hospitalization Constipation likely secondary to opioid use (Vicodin) and recent dietary changes post-hospitalization. No bowel movement since Saturday. Discussed potential correlation between constipation and abdominal pain. - Advise bowel rest with clear liquids for 24-48 hours. - Recommend over-the-counter enema or suppository for immediate relief. - Continue Miralax  as needed.  Iron deficiency anemia Recent hemoglobin level of 10.6 g/dL, likely related to recent menstrual period and high INR during hospitalization. Iron supplements recommended by hematologist but not yet started. - Order CBC to monitor hemoglobin levels. - Encourage initiation of iron supplements as recommended by hematologist.  Antiphospholipid  syndrome on chronic anticoagulation Managed with chronic anticoagulation. Recent high INR during hospitalization contributed to heavy menstrual bleeding.  Adverse reaction to morphine  (painful, red, swollen feet and legs) Severe adverse reaction to morphine  characterized by painful, red, swollen feet and legs. Symptoms resolved after discontinuation of morphine . Likely a morphine -induced peripheral edema or paresthesia. - Add morphine  to allergy  list with reaction details.      Return if symptoms worsen or fail to improve.    Subjective:    Chief Complaint  Patient presents with   GI Problem    Pt in office to discuss recent episode of Pancreatitis; pt seen in ED 08/09/23, paused Zepbound for now thinking this could be related. 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  History of Present Illness Kristen Hoover is a 38 year old female with a history of CVA and antiphospholipid syndrome who presents for follow-up after hospitalization for acute pancreatitis. She is accompanied by her husband.  She was hospitalized from August 2 to August 11, 2023, for acute pancreatitis, potentially related to her use of Zepbound, a GLP-1 medication. She experiences ongoing abdominal pain that fluctuates in intensity, currently rated between four and five, with radiation to her back, especially postprandially. Vicodin has been ineffective in managing her pain. During her hospital stay, she progressed from a liquid to a solid diet, tolerating scrambled eggs, an English muffin, and a [place] sandwich. At home, she adheres to a low-fat diet, primarily consuming oatmeal and plain pasta.  She has a history of pancreatitis since 2006, with three episodes during college, attributed to pancreas divisum, for which a stent was placed.  An MRI during her recent  hospitalization showed the stent is no longer present. She is unsure about the cause of her current pancreatitis episode and wonders if it is related to her anatomical condition or the GLP-1 medication.  During her hospital stay, she had a significant reaction to morphine , with her feet becoming red, inflamed, and painful, described as 'on fire'. This reaction resolved over several days, but she is concerned about potential neuropathy induced by morphine .  She is currently on anticoagulants for antiphospholipid syndrome. She is experiencing constipation, having not had a bowel movement since a small one on Saturday morning, despite taking Miralax  on Monday. She is considering using an enema or suppository for relief.  She feels lightheaded, attributing it to reduced food intake and prolonged bed rest. No fever or dizziness, but she acknowledges needing to increase her water intake. Urination is normal.     Past Medical History:  Diagnosis Date   Anxiety and depression    Clotting disorder (HCC)    Frequency of urination    GERD (gastroesophageal reflux disease)    Heart murmur    History of blood clots    History of pancreatitis    12-28-2018-  last episode 2006   Hypertension    PCOS (polycystic ovarian syndrome)    Renal calculus, right    Stroke [facility])    Urgency of urination    Wears glasses     Past Surgical History:  Procedure Laterality Date   BUBBLE STUDY  03/22/2022   Procedure: BUBBLE STUDY;  Surgeon: Pietro Redell RAMAN, MD;  Location: [facility] ENDOSCOPY;  Service: Cardiovascular;;   CYSTOSCOPY WITH RETROGRADE PYELOGRAM, URETEROSCOPY AND STENT PLACEMENT Right 01/05/2019   Procedure: CYSTOSCOPY/URETEROSCOP/STENT PLACEMENT;  Surgeon: Elisabeth Valli BIRCH, MD;  Location: [facility];  Service: Urology;  Laterality: Right;  75 MINS   CYSTOSCOPY WITH RETROGRADE PYELOGRAM, URETEROSCOPY AND STENT PLACEMENT Right 02/09/2019   Procedure: CYSTOSCOPY WITH RETROGRADE PYELOGRAM,  URETEROSCOPY AND STENT PLACEMENT;  Surgeon: Elisabeth Valli BIRCH, MD;  Location: [facility];  Service: Urology;  Laterality: Right;  1 HR   ESOPHAGOGASTRODUODENOSCOPY  2015   HOLMIUM LASER APPLICATION Right 02/09/2019   Procedure: HOLMIUM LASER APPLICATION;  Surgeon: Elisabeth Valli BIRCH, MD;  Location: [facility];  Service: Urology;  Laterality: Right;   TEE WITHOUT CARDIOVERSION N/A 03/22/2022   Procedure: TRANSESOPHAGEAL ECHOCARDIOGRAM (TEE);  Surgeon: Pietro Redell RAMAN, MD;  Location: [facility] ENDOSCOPY;  Service: Cardiovascular;  Laterality: N/A;   TONSILLECTOMY  2014   WISDOM TOOTH EXTRACTION  2018    Family History  Problem Relation Age of Onset   Fibromyalgia Mother    Headache Mother    Neuropathy Father    Diabetes Father    Heart failure Father    Diabetes Brother    Breast cancer Maternal Grandmother    Colon cancer Paternal Grandfather    Liver disease Neg Hx    Esophageal cancer Neg Hx    Colon polyps Neg Hx    Rectal cancer Neg Hx    Stomach cancer Neg Hx     Social History   Tobacco Use   Smoking status: Never   Smokeless tobacco: Never  Vaping Use   Vaping status: Never Used  Substance Use Topics   Alcohol use: Yes    Comment: Rare   Drug use: Not Currently    Types: Marijuana    Comment: 12-28-2018  per pt last used 5 wks ago     Allergies  Allergen Reactions   Morphine  Other (See Comments)    SWOLLEN, RED, PAINFUL FEET AND LOWER LEGS, SEVERE     Review of Systems NEGATIVE UNLESS OTHERWISE INDICATED IN HPI      Objective:     BP 104/60 (BP Location: Left Arm, Patient Position: Sitting, Cuff Size: Normal)   Pulse (!) 122   Temp (!) 97.3 F (36.3 C) (Temporal)   Ht 5' 6 (1.676 m)   Wt 195 lb 3.2 oz (88.5 kg)   LMP 08/10/2023 (Exact Date)   SpO2 99%   BMI 31.51 kg/m   Wt Readings from Last 3 Encounters:  08/13/23 195 lb 3.2 oz (88.5 kg)  07/16/23 205 lb (93 kg)  06/13/23 207 lb (93.9 kg)    BP Readings from Last  3 Encounters:  08/13/23 104/60  08/11/23 110/62  07/16/23 107/68     Physical Exam Vitals and nursing note reviewed.  Constitutional:      General: She is not in acute distress.    Appearance: Normal appearance. She is not ill-appearing.  HENT:     Head: Normocephalic and atraumatic.  Cardiovascular:     Rate and Rhythm: Normal rate and regular rhythm.     Pulses: Normal pulses.     Heart sounds: Normal heart sounds.  Pulmonary:     Effort: Pulmonary effort is normal.     Breath sounds: Normal breath sounds.  Abdominal:     General: Abdomen is flat. Bowel sounds are normal.     Palpations: Abdomen is soft.     Tenderness: There is abdominal tenderness (diffuse, mostly epigastric, no red flags). There is no right CVA tenderness, left CVA tenderness, guarding or rebound.  Skin:    General: Skin is warm and dry.  Neurological:     General: No focal deficit present.     Mental Status: She is alert.  Psychiatric:        Mood and Affect: Mood normal.             Leeya Rusconi M Kohl Polinsky, PA-C

## 2023-08-14 ENCOUNTER — Other Ambulatory Visit: Payer: Self-pay

## 2023-08-14 ENCOUNTER — Ambulatory Visit: Payer: Self-pay | Admitting: Physician Assistant

## 2023-08-14 DIAGNOSIS — Q453 Other congenital malformations of pancreas and pancreatic duct: Secondary | ICD-10-CM

## 2023-08-14 DIAGNOSIS — K859 Acute pancreatitis without necrosis or infection, unspecified: Secondary | ICD-10-CM

## 2023-08-14 NOTE — Telephone Encounter (Signed)
 Mansouraty, Aloha Raddle., MD  Legrand Victory LITTIE DOUGLAS, MD; Anitra Odetta LITTIE, RN; Charlanne Groom, MD Sorry HD. No worries, schedule follow-up with Dr. Charlanne or myself. EUS as previously discussed can be offered to patient if she agrees. GM

## 2023-08-14 NOTE — Telephone Encounter (Signed)
 EUS has been set up for 10/06/23 at 215 pm at [facility] with GM   Needs ROV with Charlanne

## 2023-08-14 NOTE — Telephone Encounter (Signed)
 10/20 AT 830 AM appt with Dr Charlanne for follow up    EUS scheduled, pt instructed and medications reviewed.  Patient instructions mailed to home.  Patient to call with any questions or concerns.

## 2023-08-18 ENCOUNTER — Ambulatory Visit (INDEPENDENT_AMBULATORY_CARE_PROVIDER_SITE_OTHER)

## 2023-08-18 DIAGNOSIS — Z7901 Long term (current) use of anticoagulants: Secondary | ICD-10-CM

## 2023-08-18 LAB — POCT INR: INR: 2.9 (ref 2.0–3.0)

## 2023-08-18 NOTE — Progress Notes (Addendum)
 Pt tests INR at home. She tested while on the phone today. INR today is 2.9 Pt scheduled for upper GI endoscopy for 9/29. Pt will be placed on a Lovenox  bridge.  Continue 2 tablets daily except take 3 tablets on Tuesday,  Thursday and Saturday. Recheck in 2 week, on 8/25. LVM with instructions and retest date.

## 2023-08-18 NOTE — Patient Instructions (Addendum)
 Pre visit review using our clinic review tool, if applicable. No additional management support is needed unless otherwise documented below in the visit note.  Continue 2 tablets daily except take 3 tablets on Tuesday,  Thursday and Saturday. Recheck in 1 week, on 8/11.

## 2023-08-20 ENCOUNTER — Telehealth: Payer: Self-pay

## 2023-08-20 NOTE — Telephone Encounter (Signed)
 Pt is scheduled for upper endoscopy on 9/29 and will be placed on a [place] bridge.  Anticoagulation indication: APS, arterial thrombus  Actual wt: 88.5 kg Ideal wt: 60 kg (actual wt is 146%> than ideal; will use adjusted wt to calculate CrCl) Adjusted wt: 71 kg  CrCl: 117.12 mL/min  Current warfarin dosing: 2 tablets (10  mg) daily except take 3 tablets (15 mg) on Tuesday, Thursday and Saturday)  Recommend once daily Lovenox  injections, 1.5 x actual wt:  120 mg every day  Lovenox  bridge instructions: (adjustment may be needed if warfarin dosing changes)  9/24: Take last warfarin 9/25: NO warfarin, NO Lovenox  9/26: NO warfarin, inject Lovenox  once in the AM 9/27: NO warfarin, inject Lovenox  once in the AM 9/28: NO warfarin, inject Lovenox  once in the AM (BEFORE 7 AM)  9/29: SURGERY; NO WARFARIN, NO LOVENOX   9/30: Take 4 1/2 tablets (22.5 mg) warfarin, inject Lovenox  once in the AM 10/1: Take 3 tablets (15 mg) warfarin, inject Lovenox  once in the AM 10/2: Take 4 1/2 tablets (22.5 mg) warfarin, inject Lovenox  once in the AM 10/3: Take 3 tablets (15 mg) warfarin, inject Lovenox  once in the AM 10/4: Take 3 tablets (15 mg) warfarin, inject Lovenox  once in the AM 10/5: Take 2 tablets (10 mg) warfarin, inject Lovenox  once in the AM 10/6: Check INR; NO WARFARIN AND NO LOVENOX  UNTIL AFTER INR CHECK

## 2023-08-21 NOTE — Telephone Encounter (Signed)
 Noted. Will educate pt on Lovenox  bridge when closer to surgery.

## 2023-09-02 ENCOUNTER — Ambulatory Visit (INDEPENDENT_AMBULATORY_CARE_PROVIDER_SITE_OTHER)

## 2023-09-02 DIAGNOSIS — Z7901 Long term (current) use of anticoagulants: Secondary | ICD-10-CM | POA: Diagnosis not present

## 2023-09-02 LAB — POCT INR: INR: 2.4 (ref 2.0–3.0)

## 2023-09-02 NOTE — Progress Notes (Signed)
 Pt tests INR at home. She tested while on the phone today. INR today is 2.4 Pt stopped taking tumeric about 4 wks ago. This can interact with warfarin. Pt also has started taking iron. Advised to take this at least 2 hours apart from warfarin.  Pt scheduled for upper GI endoscopy for 9/29. Pt will be placed on a Lovenox  bridge.  Continue 2 tablets daily except take 3 tablets on Tuesday,  Thursday and Saturday. Recheck in 2 week, on 9/9. Advised pt to continue dosing and recheck in 3 wks. Pt verbalized understanding.

## 2023-09-02 NOTE — Patient Instructions (Addendum)
 Pre visit review using our clinic review tool, if applicable. No additional management support is needed unless otherwise documented below in the visit note.  Continue 2 tablets daily except take 3 tablets on Tuesday,  Thursday and Saturday. Recheck in 2 week, on 9/9.

## 2023-09-15 ENCOUNTER — Telehealth: Payer: Self-pay | Admitting: Physician Assistant

## 2023-09-15 ENCOUNTER — Ambulatory Visit: Admitting: Physician Assistant

## 2023-09-15 ENCOUNTER — Encounter: Payer: Self-pay | Admitting: Physician Assistant

## 2023-09-15 ENCOUNTER — Ambulatory Visit: Payer: Self-pay

## 2023-09-15 VITALS — BP 110/66 | HR 89 | Temp 97.2°F | Ht 66.0 in | Wt 203.6 lb

## 2023-09-15 DIAGNOSIS — R1032 Left lower quadrant pain: Secondary | ICD-10-CM | POA: Diagnosis not present

## 2023-09-15 DIAGNOSIS — Z23 Encounter for immunization: Secondary | ICD-10-CM

## 2023-09-15 LAB — CBC WITH DIFFERENTIAL/PLATELET
Basophils Absolute: 0.1 K/uL (ref 0.0–0.1)
Basophils Relative: 1 % (ref 0.0–3.0)
Eosinophils Absolute: 0.2 K/uL (ref 0.0–0.7)
Eosinophils Relative: 1.7 % (ref 0.0–5.0)
HCT: 38.7 % (ref 36.0–46.0)
Hemoglobin: 12.9 g/dL (ref 12.0–15.0)
Lymphocytes Relative: 27.6 % (ref 12.0–46.0)
Lymphs Abs: 2.6 K/uL (ref 0.7–4.0)
MCHC: 33.3 g/dL (ref 30.0–36.0)
MCV: 83.1 fl (ref 78.0–100.0)
Monocytes Absolute: 0.5 K/uL (ref 0.1–1.0)
Monocytes Relative: 5.6 % (ref 3.0–12.0)
Neutro Abs: 6.1 K/uL (ref 1.4–7.7)
Neutrophils Relative %: 64.1 % (ref 43.0–77.0)
Platelets: 230 K/uL (ref 150.0–400.0)
RBC: 4.66 Mil/uL (ref 3.87–5.11)
RDW: 15.6 % — ABNORMAL HIGH (ref 11.5–15.5)
WBC: 9.5 K/uL (ref 4.0–10.5)

## 2023-09-15 LAB — COMPREHENSIVE METABOLIC PANEL WITH GFR
ALT: 24 U/L (ref 0–35)
AST: 16 U/L (ref 0–37)
Albumin: 4 g/dL (ref 3.5–5.2)
Alkaline Phosphatase: 86 U/L (ref 39–117)
BUN: 12 mg/dL (ref 6–23)
CO2: 27 meq/L (ref 19–32)
Calcium: 9 mg/dL (ref 8.4–10.5)
Chloride: 104 meq/L (ref 96–112)
Creatinine, Ser: 0.78 mg/dL (ref 0.40–1.20)
GFR: 96.1 mL/min (ref >60.00)
Glucose, Bld: 85 mg/dL (ref 70–99)
Potassium: 3.7 meq/L (ref 3.5–5.1)
Sodium: 138 meq/L (ref 135–145)
Total Bilirubin: 0.4 mg/dL (ref 0.2–1.2)
Total Protein: 7.2 g/dL (ref 6.0–8.3)

## 2023-09-15 LAB — POC URINALSYSI DIPSTICK (AUTOMATED)
Bilirubin, UA: NEGATIVE
Blood, UA: NEGATIVE
Glucose, UA: NEGATIVE
Ketones, UA: NEGATIVE
Leukocytes, UA: NEGATIVE
Nitrite, UA: NEGATIVE
Protein, UA: NEGATIVE
Spec Grav, UA: >=1.03 — AB (ref 1.010–1.025)
Urobilinogen, UA: 0.2 U/dL
pH, UA: 5.5 (ref 5.0–8.0)

## 2023-09-15 LAB — POCT INR: INR: 2 (ref 2.0–3.0)

## 2023-09-15 LAB — POCT URINE PREGNANCY: Preg Test, Ur: NEGATIVE

## 2023-09-15 NOTE — Progress Notes (Signed)
 Pt tests INR at home. She tested while on the phone today. INR today is 2.0 Pt also had an apt with PCP today.  Pt scheduled for upper GI endoscopy for 9/29. Pt will be placed on a Lovenox  bridge. Pt has restarted Zepbound, no interaction with warfarin. Pt reports LLQ abdominal pain  x 3 days. She was assessed by PCP today. Pt wills start a vitamin C tablet today recommended by provider to help iron absorption. Minor if any interaction with warfarin. Pt already is aware she needs to take iron and warfarin at least 2 hours apart.  Continue 2 tablets daily except take 3 tablets on Tuesday,  Thursday and Saturday. Recheck in 2 week, on 9/22. Advised pt to continue current dosing and recheck in 2 wks. Advised if any other changes to contact the coumadin  clinic. Pt verbalized understanding.

## 2023-09-15 NOTE — Telephone Encounter (Signed)
 LVM to call back to get Triaged

## 2023-09-15 NOTE — Patient Instructions (Signed)
Pre visit review using our clinic review tool, if applicable. No additional management support is needed unless otherwise documented below in the visit note. 

## 2023-09-15 NOTE — Progress Notes (Signed)
 Patient ID: Kristen Hoover, female    DOB: 08/26/1985, 38 y.o.   MRN: [id number]   Assessment & Plan:  Left lower quadrant abdominal pain -     CBC with Differential/Platelet -     Comprehensive metabolic panel with GFR -     POCT Urinalysis Dipstick (Automated) -     POCT urine pregnancy  Immunization due -     Flu vaccine trivalent PF, 6mos and older(Flulaval,Afluria,Fluarix,Fluzone)      Assessment and Plan Assessment & Plan Left lower quadrant abdominal pain Presents with left lower quadrant abdominal pain, tender to touch, since last Friday. No associated nausea, vomiting, or changes in bowel movements. No radiation of pain. Differential diagnosis includes local reaction to Zepbound injection, diverticulitis, IBS, ovarian issues, bladder infection, or kidney stone. Pain is not emergent, and no acute abdomen on examination. Recently restarted Zepbound, possible local inflammation due to injection. On blood thinners, raising concerns about internal bleeding, but no signs of acute bleeding. Recent imaging (MRI and X-ray) last month did not show abnormalities in the area. - Order CBC and CMP stat to check for any lab abnormalities. - Perform point of care urine dip and pregnancy test, both negative. - Advise using heat and taking ibuprofen or acetaminophen  for pain management. - Avoid CT scan due to previous radiation exposure; consider ultrasound if imaging is needed.      Return if symptoms worsen or fail to improve.    Subjective:    Chief Complaint  Patient presents with   Abdominal Pain    Pt in office for LLQ pain in groin area for longer than 24 hours started Friday morning; tender to touch, no pain in back or other areas of abdomen; pt admits no other symptoms, no urinary symptoms, no back pain etc.    Abdominal Pain   Discussed the use of AI scribe software for clinical note transcription with the patient, who gave verbal consent to  proceed.  History of Present Illness Kristen Hoover is a 38 year old female with a history of pancreatitis who presents with left lower quadrant abdominal pain.  She has been experiencing left lower quadrant abdominal pain that began over the weekend, noted when her husband placed his arm on her stomach, causing tenderness to touch. The pain is described as 'tender to the touch' and localized without radiation. No nausea, vomiting, or fever. The pain does not seem to be related to bowel movements as she reports having normal bowel movements recently.  She recently restarted Zepbound a week ago at a dose of 2.5 mg, with no side effects such as nausea or appetite suppression. She took the injection on Tuesday, and the pain started after that. She has been using Tylenol , which helps alleviate the pain temporarily, but it returns. She denies any recent dietary changes or alcohol consumption due to her history of pancreatitis.  She recently completed her menstrual period, which was unusual as she did not experience cramps. She has a history of kidney stones but is not experiencing similar symptoms now. She expressed anxiety about the possibility of internal bleeding because she is on blood thinners. No recent alcohol use due to her history of pancreatitis. She has not experienced any symptoms of pancreatitis recently.     Past Medical History:  Diagnosis Date   Anxiety and depression    Clotting disorder (HCC)    Frequency of urination    GERD (gastroesophageal reflux disease)    Heart murmur  History of blood clots    History of pancreatitis    12-28-2018-  last episode 2006   Hypertension    PCOS (polycystic ovarian syndrome)    Renal calculus, right    Stroke [facility])    Urgency of urination    Wears glasses     Past Surgical History:  Procedure Laterality Date   BUBBLE STUDY  03/22/2022   Procedure: BUBBLE STUDY;  Surgeon: Pietro Redell RAMAN, MD;  Location: [facility] ENDOSCOPY;   Service: Cardiovascular;;   CYSTOSCOPY WITH RETROGRADE PYELOGRAM, URETEROSCOPY AND STENT PLACEMENT Right 01/05/2019   Procedure: CYSTOSCOPY/URETEROSCOP/STENT PLACEMENT;  Surgeon: Elisabeth Valli BIRCH, MD;  Location: [facility];  Service: Urology;  Laterality: Right;  75 MINS   CYSTOSCOPY WITH RETROGRADE PYELOGRAM, URETEROSCOPY AND STENT PLACEMENT Right 02/09/2019   Procedure: CYSTOSCOPY WITH RETROGRADE PYELOGRAM, URETEROSCOPY AND STENT PLACEMENT;  Surgeon: Elisabeth Valli BIRCH, MD;  Location: [facility];  Service: Urology;  Laterality: Right;  1 HR   ESOPHAGOGASTRODUODENOSCOPY  2015   HOLMIUM LASER APPLICATION Right 02/09/2019   Procedure: HOLMIUM LASER APPLICATION;  Surgeon: Elisabeth Valli BIRCH, MD;  Location: [facility];  Service: Urology;  Laterality: Right;   TEE WITHOUT CARDIOVERSION N/A 03/22/2022   Procedure: TRANSESOPHAGEAL ECHOCARDIOGRAM (TEE);  Surgeon: Pietro Redell RAMAN, MD;  Location: [facility] ENDOSCOPY;  Service: Cardiovascular;  Laterality: N/A;   TONSILLECTOMY  2014   WISDOM TOOTH EXTRACTION  2018    Family History  Problem Relation Age of Onset   Fibromyalgia Mother    Headache Mother    Neuropathy Father    Diabetes Father    Heart failure Father    Diabetes Brother    Breast cancer Maternal Grandmother    Colon cancer Paternal Grandfather    Liver disease Neg Hx    Esophageal cancer Neg Hx    Colon polyps Neg Hx    Rectal cancer Neg Hx    Stomach cancer Neg Hx     Social History   Tobacco Use   Smoking status: Never   Smokeless tobacco: Never  Vaping Use   Vaping status: Never Used  Substance Use Topics   Alcohol use: Yes    Comment: Rare   Drug use: Not Currently    Types: Marijuana    Comment: 12-28-2018  per pt last used 5 wks ago     Allergies  Allergen Reactions   Morphine  Other (See Comments)    SWOLLEN, RED, PAINFUL FEET AND LOWER LEGS, SEVERE     Review of Systems  Gastrointestinal:  Positive for abdominal  pain.   NEGATIVE UNLESS OTHERWISE INDICATED IN HPI      Objective:     BP 110/66 (BP Location: Left Arm, Patient Position: Sitting, Cuff Size: Normal)   Pulse 89   Temp (!) 97.2 F (36.2 C) (Temporal)   Ht 5' 6 (1.676 m)   Wt 203 lb 9.6 oz (92.4 kg)   LMP 09/10/2023 (Exact Date)   SpO2 99%   BMI 32.86 kg/m   Wt Readings from Last 3 Encounters:  09/15/23 203 lb 9.6 oz (92.4 kg)  08/13/23 195 lb 3.2 oz (88.5 kg)  07/16/23 205 lb (93 kg)    BP Readings from Last 3 Encounters:  09/15/23 110/66  08/13/23 104/60  08/11/23 110/62     Physical Exam Vitals and nursing note reviewed.  Constitutional:      General: She is not in acute distress.    Appearance: Normal appearance. She is not ill-appearing.  HENT:     Head: Normocephalic and atraumatic.  Cardiovascular:     Rate and Rhythm: Normal rate and regular rhythm.     Pulses: Normal pulses.     Heart sounds: Normal heart sounds.  Pulmonary:     Effort: Pulmonary effort is normal.     Breath sounds: Normal breath sounds.  Abdominal:     General: Abdomen is flat. Bowel sounds are normal.     Palpations: Abdomen is soft.     Tenderness: There is abdominal tenderness in the left lower quadrant. There is no right CVA tenderness, left CVA tenderness, guarding or rebound. Negative signs include Murphy's sign and McBurney's sign.  Skin:    General: Skin is warm and dry.     Findings: Rash (local macular area of pink color c/w history of zepbound injection last week per pt; TTP just inferior to this area) present.  Neurological:     General: No focal deficit present.     Mental Status: She is alert.  Psychiatric:        Mood and Affect: Mood normal.             Liev Brockbank M Shinita Mac, PA-C

## 2023-09-15 NOTE — Patient Instructions (Signed)
  VISIT SUMMARY: You came in today with left lower quadrant abdominal pain that started over the weekend. We discussed possible causes and ruled out some serious conditions. We have ordered some tests to further investigate the cause of your pain.  YOUR PLAN: LEFT LOWER QUADRANT ABDOMINAL PAIN: You have been experiencing pain in your lower left abdomen that is tender to the touch. There are no signs of serious conditions like internal bleeding or acute abdomen. -We have ordered a Complete Blood Count (CBC) and Comprehensive Metabolic Panel (CMP) to check for any abnormalities. -We performed a urine dip and pregnancy test, both of which were negative. -You can use heat and take ibuprofen or acetaminophen  to manage the pain. -We will avoid a CT scan due to previous radiation exposure but may consider an ultrasound if imaging is needed.                      Contains text generated by Abridge.                                 Contains text generated by Abridge.

## 2023-09-16 ENCOUNTER — Ambulatory Visit: Payer: Self-pay | Admitting: Physician Assistant

## 2023-09-17 ENCOUNTER — Ambulatory Visit: Admitting: Gastroenterology

## 2023-09-22 ENCOUNTER — Ambulatory Visit (INDEPENDENT_AMBULATORY_CARE_PROVIDER_SITE_OTHER)

## 2023-09-22 DIAGNOSIS — Z7901 Long term (current) use of anticoagulants: Secondary | ICD-10-CM | POA: Diagnosis not present

## 2023-09-22 LAB — POCT INR: INR: 2.1 (ref 2.0–3.0)

## 2023-09-22 NOTE — Patient Instructions (Signed)
Pre visit review using our clinic review tool, if applicable. No additional management support is needed unless otherwise documented below in the visit note. 

## 2023-09-22 NOTE — Progress Notes (Signed)
 Pt tests INR at home. She tested while on the phone today. INR today is 2.1 Pt scheduled for upper GI endoscopy for 9/29. Pt will be placed on a Lovenox  bridge. Pt wills start a vitamin C tablet today recommended by provider to help iron absorption. Continue 2 tablets daily except take 3 tablets on Tuesday,  Thursday and Saturday. Recheck in 1 week, on 9/22. LVM for pt to continue dosing and recheck in 1 wks. Will provide Lovenox  dosing instructions with next INR check.

## 2023-09-29 ENCOUNTER — Ambulatory Visit (INDEPENDENT_AMBULATORY_CARE_PROVIDER_SITE_OTHER)

## 2023-09-29 ENCOUNTER — Telehealth: Payer: Self-pay

## 2023-09-29 ENCOUNTER — Telehealth: Payer: Self-pay | Admitting: Gastroenterology

## 2023-09-29 DIAGNOSIS — Z7901 Long term (current) use of anticoagulants: Secondary | ICD-10-CM

## 2023-09-29 LAB — POCT INR: INR: 2.1 (ref 2.0–3.0)

## 2023-09-29 MED ORDER — ENOXAPARIN SODIUM 150 MG/ML IJ SOSY: 150.0000 mg | PREFILLED_SYRINGE | INTRAMUSCULAR | 0 refills | Status: DC

## 2023-09-29 NOTE — Telephone Encounter (Signed)
 Pt tests INR at home but realized she is out of testing strips and was supposed to test today.  She will come into the office for INR check and be given Lovenox  bridge instructions at the apt.

## 2023-09-29 NOTE — Progress Notes (Signed)
 Pt normally tests INR at home but reports she is out of strips so came into coumadin  clinic for INR check today. Pt scheduled for upper GI endoscopy for 9/29. Pt will be placed on a Lovenox  bridge. Pt wills started a vitamin C tablet one week ago recommended by provider to help iron absorption.  Continue 2 tablets daily except take 3 tablets on Tuesday,  Thursday and Saturday until you start instructions below. Recheck in 2 weeks, on 10/6. 9/24: Take last warfarin 9/25: NO warfarin, NO Lovenox  9/26: NO warfarin, inject Lovenox  once in the AM 9/27: NO warfarin, inject Lovenox  once in the AM 9/28: NO warfarin, inject Lovenox  once in the AM (BEFORE 7 AM)   9/29: SURGERY; NO WARFARIN, NO LOVENOX    9/30: Take 4 1/2 tablets (22.5 mg) warfarin, inject Lovenox  once in the AM 10/1: Take 3 tablets (15 mg) warfarin, inject Lovenox  once in the AM 10/2: Take 4 1/2 tablets (22.5 mg) warfarin, inject Lovenox  once in the AM 10/3: Take 3 tablets (15 mg) warfarin, inject Lovenox  once in the AM 10/4: Take 3 tablets (15 mg) warfarin, inject Lovenox  once in the AM 10/5: Take 2 tablets (10 mg) warfarin, inject Lovenox  once in the AM 10/6: Check INR; NO WARFARIN AND NO LOVENOX  UNTIL AFTER INR CHECK  Wt has increased since creating Lovenox  bridge. Will need to increase Lovenox  dose to 150 mg daily. Sent in script.

## 2023-09-29 NOTE — Telephone Encounter (Addendum)
 Procedure:Upper EUS Procedure date: 10/06/23 Procedure location: [facility] Arrival Time: 12:45 pm Spoke with the patient Y/N: Yes Any prep concerns? No  Has the patient obtained the prep from the pharmacy ? No prep needed Do you have a care partner and transportation: Yes Any additional concerns? No

## 2023-09-29 NOTE — Telephone Encounter (Signed)
 Noted and agreed, thank you.

## 2023-09-29 NOTE — Telephone Encounter (Signed)
 Pt's wt has increased from the time the Lovenox  bridge was created. Pt was on the line of 120 mg daily but would now benefit, due to increase in wt, to 150 mg daily. This is the dose pt has taken in the past and is now recommended for this surgery also.

## 2023-09-29 NOTE — Patient Instructions (Addendum)
 Pre visit review using our clinic review tool, if applicable. No additional management support is needed unless otherwise documented below in the visit note.  Continue 2 tablets daily except take 3 tablets on Tuesday,  Thursday and Saturday until you start instructions below. Recheck in 2 weeks, on 10/6. 9/24: Take last warfarin 9/25: NO warfarin, NO Lovenox  9/26: NO warfarin, inject Lovenox  once in the AM 9/27: NO warfarin, inject Lovenox  once in the AM 9/28: NO warfarin, inject Lovenox  once in the AM (BEFORE 7 AM)   9/29: SURGERY; NO WARFARIN, NO LOVENOX    9/30: Take 4 1/2 tablets (22.5 mg) warfarin, inject Lovenox  once in the AM 10/1: Take 3 tablets (15 mg) warfarin, inject Lovenox  once in the AM 10/2: Take 4 1/2 tablets (22.5 mg) warfarin, inject Lovenox  once in the AM 10/3: Take 3 tablets (15 mg) warfarin, inject Lovenox  once in the AM 10/4: Take 3 tablets (15 mg) warfarin, inject Lovenox  once in the AM 10/5: Take 2 tablets (10 mg) warfarin, inject Lovenox  once in the AM 10/6: Check INR; NO WARFARIN AND NO LOVENOX  UNTIL AFTER INR CHECK

## 2023-09-30 ENCOUNTER — Encounter (HOSPITAL_COMMUNITY): Payer: Self-pay | Admitting: Gastroenterology

## 2023-10-01 ENCOUNTER — Encounter: Payer: Self-pay | Admitting: Gastroenterology

## 2023-10-05 NOTE — Anesthesia Preprocedure Evaluation (Signed)
 Anesthesia Evaluation  Patient identified by MRN, date of birth, ID band Patient awake    Reviewed: Allergy  & Precautions, NPO status , Patient's Chart, lab work & pertinent test results  History of Anesthesia Complications Negative for: history of anesthetic complications  Airway Mallampati: II  TM Distance: >3 FB Neck ROM: Full    Dental no notable dental hx. (+) Teeth Intact, Dental Advisory Given   Pulmonary Current Smoker   Pulmonary exam normal breath sounds clear to auscultation       Cardiovascular (-) angina (-) CAD and (-) Past MI Normal cardiovascular exam Rhythm:Regular Rate:Normal     Neuro/Psych  PSYCHIATRIC DISORDERS Anxiety Depression    CVA, No Residual Symptoms    GI/Hepatic ,GERD  ,,Pancreatitis   Endo/Other    Renal/GU Renal diseaseLab Results      Component                Value               Date                             K                        3.7                 09/15/2023                      CREATININE               0.78                09/15/2023                GFR                      96.10               09/15/2023                    Musculoskeletal negative musculoskeletal ROS (+)    Abdominal   Peds  Hematology negative hematology ROS (+) Lab Results      Component                Value               Date                      WBC                      9.5                 09/15/2023                HGB                      12.9                09/15/2023                HCT                      38.7                09/15/2023  MCV                      83.1                09/15/2023                PLT                      230.0               09/15/2023           On lovenox  for Anti Phosphoipis sx   Anesthesia Other Findings All: morphine   Reproductive/Obstetrics negative OB ROS                              Anesthesia Physical Anesthesia  Plan  ASA: 2  Anesthesia Plan: MAC   Post-op Pain Management: Minimal or no pain anticipated   Induction: Intravenous  PONV Risk Score and Plan: Propofol  infusion and Treatment may vary due to age or medical condition  Airway Management Planned: Natural Airway and Nasal Cannula  Additional Equipment: None  Intra-op Plan:   Post-operative Plan:   Informed Consent: I have reviewed the patients History and Physical, chart, labs and discussed the procedure including the risks, benefits and alternatives for the proposed anesthesia with the patient or authorized representative who has indicated his/her understanding and acceptance.     Dental advisory given  Plan Discussed with: CRNA and Surgeon  Anesthesia Plan Comments:         Anesthesia Quick Evaluation

## 2023-10-06 ENCOUNTER — Encounter (HOSPITAL_COMMUNITY)
Admission: RE | Disposition: A | Discharge status: Home / Self Care | Payer: Self-pay | Source: Home / Self Care | Attending: Gastroenterology

## 2023-10-06 ENCOUNTER — Ambulatory Visit (HOSPITAL_COMMUNITY)
Admission: RE | Admit: 2023-10-06 | Discharge: 2023-10-06 | Disposition: A | Discharge status: Home / Self Care | Attending: Gastroenterology | Admitting: Gastroenterology

## 2023-10-06 ENCOUNTER — Ambulatory Visit (HOSPITAL_COMMUNITY): Admitting: Anesthesiology

## 2023-10-06 ENCOUNTER — Encounter (HOSPITAL_COMMUNITY): Admitting: Anesthesiology

## 2023-10-06 ENCOUNTER — Encounter (HOSPITAL_COMMUNITY): Payer: Self-pay | Admitting: Gastroenterology

## 2023-10-06 ENCOUNTER — Other Ambulatory Visit: Payer: Self-pay

## 2023-10-06 DIAGNOSIS — K2970 Gastritis, unspecified, without bleeding: Secondary | ICD-10-CM

## 2023-10-06 DIAGNOSIS — K219 Gastro-esophageal reflux disease without esophagitis: Secondary | ICD-10-CM | POA: Diagnosis not present

## 2023-10-06 DIAGNOSIS — F418 Other specified anxiety disorders: Secondary | ICD-10-CM | POA: Diagnosis not present

## 2023-10-06 DIAGNOSIS — K862 Cyst of pancreas: Secondary | ICD-10-CM | POA: Diagnosis not present

## 2023-10-06 DIAGNOSIS — K2289 Other specified disease of esophagus: Secondary | ICD-10-CM

## 2023-10-06 DIAGNOSIS — Z980 Intestinal bypass and anastomosis status: Secondary | ICD-10-CM | POA: Diagnosis not present

## 2023-10-06 DIAGNOSIS — K449 Diaphragmatic hernia without obstruction or gangrene: Secondary | ICD-10-CM

## 2023-10-06 DIAGNOSIS — Q453 Other congenital malformations of pancreas and pancreatic duct: Secondary | ICD-10-CM | POA: Insufficient documentation

## 2023-10-06 DIAGNOSIS — F17200 Nicotine dependence, unspecified, uncomplicated: Secondary | ICD-10-CM | POA: Insufficient documentation

## 2023-10-06 DIAGNOSIS — K2950 Unspecified chronic gastritis without bleeding: Secondary | ICD-10-CM | POA: Diagnosis not present

## 2023-10-06 DIAGNOSIS — K8689 Other specified diseases of pancreas: Secondary | ICD-10-CM

## 2023-10-06 DIAGNOSIS — I899 Noninfective disorder of lymphatic vessels and lymph nodes, unspecified: Secondary | ICD-10-CM

## 2023-10-06 DIAGNOSIS — K828 Other specified diseases of gallbladder: Secondary | ICD-10-CM | POA: Diagnosis not present

## 2023-10-06 DIAGNOSIS — K859 Acute pancreatitis without necrosis or infection, unspecified: Secondary | ICD-10-CM | POA: Insufficient documentation

## 2023-10-06 HISTORY — PX: ESOPHAGOGASTRODUODENOSCOPY: SHX5428

## 2023-10-06 HISTORY — PX: EUS: SHX5427

## 2023-10-06 LAB — GLUCOSE, CAPILLARY: Glucose-Capillary: 86 mg/dL (ref 70–99)

## 2023-10-06 SURGERY — ULTRASOUND, UPPER GI TRACT, ENDOSCOPIC
Anesthesia: Monitor Anesthesia Care

## 2023-10-06 MED ORDER — PROPOFOL 500 MG/50ML IV EMUL
INTRAVENOUS | Status: DC | PRN
  Administered 2023-10-06: 125 mg via INTRAVENOUS
  Administered 2023-10-06: 200 ug/kg/min via INTRAVENOUS
  Administered 2023-10-06: 75 mg via INTRAVENOUS

## 2023-10-06 MED ORDER — PROPOFOL 500 MG/50ML IV EMUL
INTRAVENOUS | Status: AC
  Filled 2023-10-06: qty 50

## 2023-10-06 MED ORDER — PROPOFOL 1000 MG/100ML IV EMUL
INTRAVENOUS | Status: AC
  Filled 2023-10-06: qty 100

## 2023-10-06 MED ORDER — SODIUM CHLORIDE 0.9 % IV SOLN: INTRAVENOUS | Status: DC

## 2023-10-06 NOTE — H&P (Signed)
 GASTROENTEROLOGY PROCEDURE H&P NOTE   Primary Care Physician: Allwardt, Mardy HERO, PA-C  HPI: Kristen Hoover is a 38 y.o. female who presents for EGD/EUS to evaluate history of previous pancreatitis and history of pancreatic divisum and rule out chronic pancreatitis (more recent pancreatitis episode timed with possible GLP analogue etiology but not absolute).  Past Medical History:  Diagnosis Date   Anxiety and depression    Clotting disorder    Frequency of urination    GERD (gastroesophageal reflux disease)    Heart murmur    History of blood clots    History of pancreatitis    12-28-2018-  last episode 2006   Hypertension    PCOS (polycystic ovarian syndrome)    Renal calculus, right    Stroke [facility])    Urgency of urination    Wears glasses    Past Surgical History:  Procedure Laterality Date   BUBBLE STUDY  03/22/2022   Procedure: BUBBLE STUDY;  Surgeon: Pietro Redell RAMAN, MD;  Location: [facility] ENDOSCOPY;  Service: Cardiovascular;;   CYSTOSCOPY WITH RETROGRADE PYELOGRAM, URETEROSCOPY AND STENT PLACEMENT Right 01/05/2019   Procedure: CYSTOSCOPY/URETEROSCOP/STENT PLACEMENT;  Surgeon: Elisabeth Valli BIRCH, MD;  Location: [facility];  Service: Urology;  Laterality: Right;  75 MINS   CYSTOSCOPY WITH RETROGRADE PYELOGRAM, URETEROSCOPY AND STENT PLACEMENT Right 02/09/2019   Procedure: CYSTOSCOPY WITH RETROGRADE PYELOGRAM, URETEROSCOPY AND STENT PLACEMENT;  Surgeon: Elisabeth Valli BIRCH, MD;  Location: [facility];  Service: Urology;  Laterality: Right;  1 HR   ESOPHAGOGASTRODUODENOSCOPY  2015   HOLMIUM LASER APPLICATION Right 02/09/2019   Procedure: HOLMIUM LASER APPLICATION;  Surgeon: Elisabeth Valli BIRCH, MD;  Location: [facility];  Service: Urology;  Laterality: Right;   TEE WITHOUT CARDIOVERSION N/A 03/22/2022   Procedure: TRANSESOPHAGEAL ECHOCARDIOGRAM (TEE);  Surgeon: Pietro Redell RAMAN, MD;  Location: [facility] ENDOSCOPY;  Service:  Cardiovascular;  Laterality: N/A;   TONSILLECTOMY  2014   WISDOM TOOTH EXTRACTION  2018   Current Facility-Administered Medications  Medication Dose Route Frequency Provider Last Rate Last Admin   0.9 %  sodium chloride  infusion   Intravenous Continuous Mansouraty, Aloha Raddle., MD        Current Facility-Administered Medications:    0.9 %  sodium chloride  infusion, , Intravenous, Continuous, Mansouraty, Aloha Raddle., MD Allergies  Allergen Reactions   Morphine  Other (See Comments)    SWOLLEN, RED, PAINFUL FEET AND LOWER LEGS, SEVERE    Family History  Problem Relation Age of Onset   Fibromyalgia Mother    Headache Mother    Neuropathy Father    Diabetes Father    Heart failure Father    Diabetes Brother    Breast cancer Maternal Grandmother    Colon cancer Paternal Grandfather    Liver disease Neg Hx    Esophageal cancer Neg Hx    Colon polyps Neg Hx    Rectal cancer Neg Hx    Stomach cancer Neg Hx    Social History   Socioeconomic History   Marital status: Married    Spouse name: Not on file   Number of children: 0   Years of education: Not on file   Highest education level: Bachelor's degree (e.g., BA, AB, BS)  Occupational History   Occupation: Engineer, agricultural  Tobacco Use   Smoking status: Never   Smokeless tobacco: Never  Vaping Use   Vaping status: Never Used  Substance and Sexual Activity   Alcohol use: Yes    Comment: Rare  Drug use: Not Currently    Types: Marijuana    Comment: 12-28-2018  per pt last used 5 wks ago   Sexual activity: Not on file    Comment: husband had vasectomy  Other Topics Concern   Not on file  Social History Narrative   Are you right handed or left handed? Right    Are you currently employed ?    What is your current occupation?   Do you live at home alone?   Who lives with you?    What type of home do you live in: 1 story or 2 story? 2       Social Drivers of Corporate investment banker Strain: Low Risk  (05/12/2023)    Received from [facility]   Overall Financial Resource Strain (CARDIA)    Difficulty of Paying Living Expenses: Not hard at all  Food Insecurity: No Food Insecurity (08/09/2023)   Hunger Vital Sign    Worried About Running Out of Food in the Last Year: Never true    Ran Out of Food in the Last Year: Never true  Transportation Needs: No Transportation Needs (08/09/2023)   PRAPARE - Administrator, Civil Service (Medical): No    Lack of Transportation (Non-Medical): No  Physical Activity: Unknown (03/04/2023)   Exercise Vital Sign    Days of Exercise per Week: Patient declined    Minutes of Exercise per Session: Not on file  Stress: Stress Concern Present (03/04/2023)   [employer] of Occupational Health - Occupational Stress Questionnaire    Feeling of Stress : Very much  Social Connections: Unknown (03/04/2023)   Social Connection and Isolation Panel    Frequency of Communication with Friends and Family: Twice a week    Frequency of Social Gatherings with Friends and Family: Once a week    Attends Religious Services: Patient declined    Database administrator or Organizations: No    Attends Engineer, structural: Not on file    Marital Status: Married  Catering manager Violence: Not At Risk (08/09/2023)   Humiliation, Afraid, Rape, and Kick questionnaire    Fear of Current or Ex-Partner: No    Emotionally Abused: No    Physically Abused: No    Sexually Abused: No    Physical Exam: There were no vitals filed for this visit. There is no height or weight on file to calculate BMI. GEN: NAD EYE: Sclerae anicteric ENT: MMM CV: Non-tachycardic GI: Soft, NT/ND NEURO:  Alert & Oriented x 3  Lab Results: No results for input(s): WBC, HGB, HCT, PLT in the last 72 hours. BMET No results for input(s): NA, K, CL, CO2, GLUCOSE, BUN, CREATININE, CALCIUM  in the last 72 hours. LFT No results for input(s): PROT, ALBUMIN,  AST, ALT, ALKPHOS, BILITOT, BILIDIR, IBILI in the last 72 hours. PT/INR No results for input(s): LABPROT, INR in the last 72 hours.   Impression / Plan: This is a 38 y.o.female who presents for EGD/EUS to evaluate history of previous pancreatitis and history of pancreatic divisum and rule out chronic pancreatitis (more recent pancreatitis episode timed with possible GLP analogue etiology but not absolute).  The risks of an EUS including intestinal perforation, bleeding, infection, aspiration, and medication effects were discussed as was the possibility it may not give a definitive diagnosis if a biopsy is performed.  When a biopsy of the pancreas is done as part of the EUS, there is an additional risk of pancreatitis  at the rate of about 1-2%.  It was explained that procedure related pancreatitis is typically mild, although it can be severe and even life threatening, which is why we do not perform random pancreatic biopsies and only biopsy a lesion/area we feel is concerning enough to warrant the risk.   The risks and benefits of endoscopic evaluation/treatment were discussed with the patient and/or family; these include but are not limited to the risk of perforation, infection, bleeding, missed lesions, lack of diagnosis, severe illness requiring hospitalization, as well as anesthesia and sedation related illnesses.  The patient's history has been reviewed, patient examined, no change in status, and deemed stable for procedure.  The patient and/or family is agreeable to proceed.    Aloha Finner, MD [facility] Office # [phone]

## 2023-10-06 NOTE — Transfer of Care (Signed)
 Immediate Anesthesia Transfer of Care Note  Patient: Kristen Hoover  Procedure(s) Performed: ULTRASOUND, UPPER GI TRACT, ENDOSCOPIC EGD (ESOPHAGOGASTRODUODENOSCOPY)  Patient Location: PACU and Endoscopy Unit  Anesthesia Type:MAC  Level of Consciousness: awake and drowsy  Airway & Oxygen Therapy: Patient Spontanous Breathing  Post-op Assessment: Report given to RN and Post -op Vital signs reviewed and stable  Post vital signs: Reviewed and stable  Last Vitals:  Vitals Value Taken Time  BP    Temp    Pulse    Resp    SpO2      Last Pain:  Vitals:   10/06/23 1100  TempSrc: Temporal  PainSc: 0-No pain      Patients Stated Pain Goal: 0 (10/06/23 1100)  Complications: No notable events documented.

## 2023-10-06 NOTE — Anesthesia Postprocedure Evaluation (Signed)
 Anesthesia Post Note  Patient: Kristen Hoover  Procedure(s) Performed: ULTRASOUND, UPPER GI TRACT, ENDOSCOPIC EGD (ESOPHAGOGASTRODUODENOSCOPY)     Patient location during evaluation: Endoscopy Anesthesia Type: MAC Level of consciousness: awake and alert Pain management: pain level controlled Vital Signs Assessment: post-procedure vital signs reviewed and stable Respiratory status: spontaneous breathing, nonlabored ventilation, respiratory function stable and patient connected to nasal cannula oxygen Cardiovascular status: blood pressure returned to baseline and stable Postop Assessment: no apparent nausea or vomiting Anesthetic complications: no   No notable events documented.  Last Vitals:  Vitals:   10/06/23 1226 10/06/23 1230  BP:  110/87  Pulse: 69 77  Resp: 15 20  Temp:    SpO2: 100% 100%    Last Pain:  Vitals:   10/06/23 1230  TempSrc:   PainSc: 0-No pain                 Garnette DELENA [name]

## 2023-10-06 NOTE — Discharge Instructions (Signed)

## 2023-10-06 NOTE — Op Note (Addendum)
 [facility] Patient Name: Kristen Hoover Procedure Date: 10/06/2023 MRN: [id number] Attending MD: Aloha Finner , MD, [id number] Date of Birth: Sep 25, 1985 CSN: [id number] Age: 38 Admit Type: Outpatient Procedure:                Upper EUS Indications:              Abnormal MRCP, Acute pancreatitis Providers:                Aloha Finner, MD, Olam Riedel, RN, [facility]                            [place], Technician, Carliss Martin,CRNA Referring MD:              Medicines:                Monitored Anesthesia Care Complications:            No immediate complications. Estimated Blood Loss:     Estimated blood loss was minimal. Procedure:                Pre-Anesthesia Assessment:                           - Prior to the procedure, a History and Physical                            was performed, and patient medications and                            allergies were reviewed. The patient's tolerance of                            previous anesthesia was also reviewed. The risks                            and benefits of the procedure and the sedation                            options and risks were discussed with the patient.                            All questions were answered, and informed consent                            was obtained. Prior Anticoagulants: The patient                            last took Coumadin  (warfarin) 5 days and Lovenox                             (enoxaparin ) 1 day prior to the procedure. ASA                            Grade Assessment: III - A patient with severe  systemic disease. After reviewing the risks and                            benefits, the patient was deemed in satisfactory                            condition to undergo the procedure.                           After obtaining informed consent, the endoscope was                            passed under direct vision. Throughout the                             procedure, the patient's blood pressure, pulse, and                            oxygen saturations were monitored continuously. The                            [id number]-[id number] ([id number]) Olympus endoscope was introduced                            through the mouth, and advanced to the second part                            of duodenum. The [id number]-[id number] ([id number]) Olympus                            duodenoscope was introduced through the mouth, and                            advanced to the area of papilla. The [id number]                            ([id number]) Olympus endosonoscope was introduced                            through the mouth, and advanced to the duodenum for                            ultrasound examination from the stomach and                            duodenum. The upper EUS was accomplished without                            difficulty. The patient tolerated the procedure. Scope In: Scope Out: Findings:      ENDOSCOPIC FINDING: :      No gross lesions were noted in the entire esophagus.      The Z-line was irregular and was found 35 cm from the incisors.      A 1 cm hiatal hernia  was present.      Patchy mildly erythematous mucosa without bleeding was found in the       gastric antrum.      No other gross lesions were noted in the entire examined stomach.       Biopsies were taken with a cold forceps for histology and Helicobacter       pylori testing.      There was evidence of a congested though patent minor papilla       sphincterotomy (I could see pancreatic secretions). Previous surgical       anastomosis in the minor papilla.      The major papilla was normal.      No other gross lesions were noted in the duodenal bulb, in the first       portion of the duodenum and in the second portion of the duodenum.      ENDOSONOGRAPHIC FINDING: :      Pancreas divisum anatomy was visualized.      The diameter of the main pancreatic duct (MPD) measured:      - NOP 1.8 -> 2.9 ->  2.0 mm (neck of pancreas); there was evidence of       this being prominent but did not meet overt criteria for dilation      - BOP 1.2 mm (body of the pancreas)      - TOP 0.7 mm (tail of the pancreas).      An anechoic lesion suggestive of a cyst was identified in the genu of       the pancreas. It is not in obvious communication with the pancreatic       duct. The lesion measured 5 mm by 3 mm in maximal cross-sectional       diameter. There was a single compartment without septae. The outer wall       of the lesion was not seen. There was no associated mass. There was no       internal debris within the fluid-filled cavity.      Pancreatic parenchymal abnormalities were noted in the entire pancreas.       These consisted of hyperechoic strands.      There was no sign of significant endosonographic abnormality at the       ampulla. No masses and no wall thickening were identified.      There was no sign of significant endosonographic abnormality in the       common bile duct (2.8 -> 3.9 mm) and in the common hepatic duct (6.6       mm). Ducts with regular contour were identified.      Minimal hyperechoic material consistent with sludge was visualized       endosonographically in the gallbladder.      Endosonographic imaging in the visualized portion of the liver showed no       mass.      No malignant-appearing lymph nodes were visualized in the celiac region       (level 20), peripancreatic region and porta hepatis region.      The celiac region was visualized. Impression:               EGD impression:                           - No gross lesions in the entire esophagus. Z-line  irregular, 35 cm from the incisors.                           - 1 cm hiatal hernia.                           - Erythematous mucosa in the antrum. No other gross                            lesions in the entire stomach. Biopsied.                           - Patent though congested  minor papilla                            sphincterotomy noted.                           - Normal major papilla.                           - No other gross lesions in the duodenal bulb, in                            the first portion of the duodenum and in the second                            portion of the duodenum.                           EUS impression:                           - Pancreas divisum was visualized.                           - Main pancreatic duct (MPD) diameter was measured.                            Endosonographically, the MPD had a prominent                            appearance in the area of the pancreas divisum                            entering into the minor papilla, but otherwise did                            not appear to have ductal dilation.                           - A cystic lesion was seen in the genu of the                            pancreas. Tissue has not been obtained.  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                           - Pancreatic parenchymal abnormalities consisting                            of hyperechoic strands were noted in the entire                            pancreas.                           - There was no sign of significant pathology at the                            ampulla.                           - There was no sign of significant pathology in the                            common bile duct and in the common hepatic duct.                           - Minimal amount of hyperechoic material consistent                            with sludge was visualized endosonographically in                            the gallbladder.                           - No malignant-appearing lymph nodes were                            visualized in the celiac region (level 20),                             peripancreatic region and porta hepatis region. Moderate Sedation:      Not Applicable - Patient had care per Anesthesia. Recommendation:           - The patient will be observed post-procedure,                            until all discharge criteria are met.                           - Discharge patient to home.                           -  Patient has a contact number available for                            emergencies. The signs and symptoms of potential                            delayed complications were discussed with the                            patient. Return to normal activities tomorrow.                            Written discharge instructions were provided to the                            patient.                           - Resume previous diet.                           - Observe patient's clinical course.                           - Can restart Lovenox  tonight and Coumadin  per                            clinic recommendations.                           - Await path results.                           - Patient's finding of gallbladder sludge (although                            minimal) without overt cholelithiasis is something                            to consider as a potential etiology for her most                            recent bout of pancreatitis. Can discuss                            consideration of surgical referral.                           - Patient's underlying pancreatic divisum anatomy                            may also be a potential etiology for her bout of                            most recent pancreatitis (prior history when she  was 6/39 years of age with pancreatic                            sphincterotomy performed to the minor papilla)                            though her imaging today on EUS and MRCP do not                            show overt/severe dilation although there is some                             prominence of the duct as it is reaching the area                            of the minor papilla. I did see evidence of                            pancreatic secretions however coming out of the                            minor papilla orifice.                           - Also keep in mind the potential of GLP analog as                            a possible source for her most recent bout of                            pancreatitis (she has already reinitiated that).                           - Thus, patient still has multiple potential                            etiologies of the most recent bout of pancreatitis.                            Thankfully she is not having any evidence of                            recurring pancreatitis at this time. Would need                            formal discussion in clinic about high risk                            pancreatic ERCP attempt (previous ERCPist was not                            able to traverse through the neck based on the  notes that she has supplied). If patient is wanting                            to pursue this, we can discuss things further.                           - The findings and recommendations were discussed                            with the patient.                           - The findings and recommendations were discussed                            with the patient's family. Procedure Code(s):        --- Professional ---                           [id number], Esophagogastroduodenoscopy, flexible,                            transoral; with endoscopic ultrasound examination                            limited to the esophagus, stomach or duodenum, and                            adjacent structures                           43239, Esophagogastroduodenoscopy, flexible,                            transoral; with biopsy, single or multiple Diagnosis Code(s):        --- Professional ---                            K22.89, Other specified disease of esophagus                           K44.9, Diaphragmatic hernia without obstruction or                            gangrene                           K31.89, Other diseases of stomach and duodenum                           Z98.0, Intestinal bypass and anastomosis status                           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, Other congenital malformations of pancreas                            and  pancreatic duct                           K86.2, Cyst of pancreas                           K86.9, Disease of pancreas, unspecified                           I89.9, Noninfective disorder of lymphatic vessels                            and lymph nodes, unspecified                           K85.90, Acute pancreatitis without necrosis or                            infection, unspecified                           K83.8, Other specified diseases of biliary tract                           R93.2, Abnormal findings on diagnostic imaging of                            liver and biliary tract CPT copyright 2022 [employer]. All rights reserved. The codes documented in this report are preliminary and upon coder review may  be revised to meet current compliance requirements. Aloha Finner, MD 10/06/2023 12:20:31 PM Number of Addenda: 0

## 2023-10-07 ENCOUNTER — Encounter (HOSPITAL_COMMUNITY): Payer: Self-pay | Admitting: Gastroenterology

## 2023-10-07 ENCOUNTER — Ambulatory Visit: Payer: Self-pay | Admitting: Gastroenterology

## 2023-10-07 LAB — SURGICAL PATHOLOGY

## 2023-10-13 ENCOUNTER — Ambulatory Visit (INDEPENDENT_AMBULATORY_CARE_PROVIDER_SITE_OTHER): Payer: Self-pay

## 2023-10-13 DIAGNOSIS — Z7901 Long term (current) use of anticoagulants: Secondary | ICD-10-CM

## 2023-10-13 LAB — POCT INR: INR: 2.1 (ref 2.0–3.0)

## 2023-10-13 NOTE — Progress Notes (Signed)
 Pt had endoscopy on 9/29 and was placed on a lovenox  bridge. Stop Lovenox  injections. Continue 2 tablets daily except take 3 tablets on Tuesday,  Thursday and Saturday until you start instructions below. Recheck in 2 weeks, on 10/20. LVM with dosing instructions and recheck date.

## 2023-10-13 NOTE — Patient Instructions (Addendum)
 Pre visit review using our clinic review tool, if applicable. No additional management support is needed unless otherwise documented below in the visit note.  Stop Lovenox  injections. Continue 2 tablets daily except take 3 tablets on Tuesday,  Thursday and Saturday until you start instructions below. Recheck in 2 weeks, on 10/20.

## 2023-10-18 ENCOUNTER — Other Ambulatory Visit: Payer: Self-pay | Admitting: Allergy & Immunology

## 2023-10-23 ENCOUNTER — Other Ambulatory Visit: Payer: Self-pay

## 2023-10-23 ENCOUNTER — Encounter: Payer: Self-pay | Admitting: Physician Assistant

## 2023-10-23 MED ORDER — LEVOCETIRIZINE DIHYDROCHLORIDE 5 MG PO TABS: 5.0000 mg | ORAL_TABLET | Freq: Two times a day (BID) | ORAL | 1 refills | Status: AC

## 2023-10-23 NOTE — Telephone Encounter (Signed)
 Ok to take over levocetrizine Rx for patient?

## 2023-10-27 ENCOUNTER — Encounter: Payer: Self-pay | Admitting: Gastroenterology

## 2023-10-27 ENCOUNTER — Ambulatory Visit (INDEPENDENT_AMBULATORY_CARE_PROVIDER_SITE_OTHER): Admitting: Gastroenterology

## 2023-10-27 ENCOUNTER — Ambulatory Visit (INDEPENDENT_AMBULATORY_CARE_PROVIDER_SITE_OTHER)

## 2023-10-27 ENCOUNTER — Other Ambulatory Visit (INDEPENDENT_AMBULATORY_CARE_PROVIDER_SITE_OTHER)

## 2023-10-27 DIAGNOSIS — K859 Acute pancreatitis without necrosis or infection, unspecified: Secondary | ICD-10-CM

## 2023-10-27 DIAGNOSIS — Z7985 Long-term (current) use of injectable non-insulin antidiabetic drugs: Secondary | ICD-10-CM

## 2023-10-27 DIAGNOSIS — Z7901 Long term (current) use of anticoagulants: Secondary | ICD-10-CM

## 2023-10-27 DIAGNOSIS — Q453 Other congenital malformations of pancreas and pancreatic duct: Secondary | ICD-10-CM | POA: Diagnosis not present

## 2023-10-27 DIAGNOSIS — Z8719 Personal history of other diseases of the digestive system: Secondary | ICD-10-CM

## 2023-10-27 LAB — COMPREHENSIVE METABOLIC PANEL WITH GFR
ALT: 23 U/L (ref 0–35)
AST: 14 U/L (ref 0–37)
Albumin: 4.4 g/dL (ref 3.5–5.2)
Alkaline Phosphatase: 90 U/L (ref 39–117)
BUN: 14 mg/dL (ref 6–23)
CO2: 25 meq/L (ref 19–32)
Calcium: 9.1 mg/dL (ref 8.4–10.5)
Chloride: 106 meq/L (ref 96–112)
Creatinine, Ser: 0.61 mg/dL (ref 0.40–1.20)
GFR: 113.02 mL/min (ref >60.00)
Glucose, Bld: 83 mg/dL (ref 70–99)
Potassium: 4.1 meq/L (ref 3.5–5.1)
Sodium: 139 meq/L (ref 135–145)
Total Bilirubin: 0.4 mg/dL (ref 0.2–1.2)
Total Protein: 7.5 g/dL (ref 6.0–8.3)

## 2023-10-27 LAB — LIPASE: Lipase: 40 U/L (ref 11.0–59.0)

## 2023-10-27 LAB — CBC
HCT: 38 % (ref 36.0–46.0)
Hemoglobin: 13.2 g/dL (ref 12.0–15.0)
MCHC: 34.7 g/dL (ref 30.0–36.0)
MCV: 80.9 fl (ref 78.0–100.0)
Platelets: 228 K/uL (ref 150.0–400.0)
RBC: 4.69 Mil/uL (ref 3.87–5.11)
RDW: 14.9 % (ref 11.5–15.5)
WBC: 10.4 K/uL (ref 4.0–10.5)

## 2023-10-27 LAB — HEMOGLOBIN A1C: Hgb A1c MFr Bld: 5 % (ref 4.6–6.5)

## 2023-10-27 LAB — POCT INR: INR: 3.1 — AB (ref 2.0–3.0)

## 2023-10-27 NOTE — Progress Notes (Signed)
 Indication: stroke, APS Pt reports having a cold, with head congestion x 1 wk. 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 Pt is not eating as many vitamin K containing foods and has not been consistent with them. Reports nausea and vomiting once last week.  Reduce dose today to take 1 1/2 tablets and then continue 2 tablets daily except take 3 tablets on Tuesday,  Thursday and Saturday. Recheck in 2 weeks, on 11/3. Advised pt of dosing and recheck date. Pt verbalized understanding.

## 2023-10-27 NOTE — Patient Instructions (Addendum)
 Pre visit review using our clinic review tool, if applicable. No additional management support is needed unless otherwise documented below in the visit note.  Reduce dose today to take 1 1/2 tablets and then continue 2 tablets daily except take 3 tablets on Tuesday,  Thursday and Saturday. Recheck in 2 weeks, on 11/3.

## 2023-10-27 NOTE — Patient Instructions (Signed)
 _______________________________________________________  If your blood pressure at your visit was 140/90 or greater, please contact your primary care physician to follow up on this.  _______________________________________________________  If you are age 39 or older, your body mass index should be between 23-30. Your Body mass index is 32.5 kg/m. If this is out of the aforementioned range listed, please consider follow up with your Primary Care Provider.  If you are age 39 or younger, your body mass index should be between 19-25. Your Body mass index is 32.5 kg/m. If this is out of the aformentioned range listed, please consider follow up with your Primary Care Provider.   ________________________________________________________  The  GI providers would like to encourage you to use MYCHART to communicate with providers for non-urgent requests or questions.  Due to long hold times on the telephone, sending your provider a message by [facility] may be a faster and more efficient way to get a response.  Please allow 48 business hours for a response.  Please remember that this is for non-urgent requests.  _______________________________________________________  [facility] is using a team-based approach to care.  Your team is made up of your doctor and two to three APPS. Our APPS (Nurse Practitioners and Physician Assistants) work with your physician to ensure care continuity for you. They are fully qualified to address your health concerns and develop a treatment plan. They communicate directly with your gastroenterologist to care for you. Seeing the Advanced Practice Practitioners on your physician's team can help you by facilitating care more promptly, often allowing for earlier appointments, access to diagnostic testing, procedures, and other specialty referrals.   Your provider has requested that you go to the basement level for lab work before leaving today. Press B on the  elevator. The lab is located at the first door on the left as you exit the elevator.  You have been scheduled for an abdominal ultrasound at [facility] (1st floor of hospital) on 11-04-23 at 9:30am. Please arrive 15 minutes prior to your appointment for registration. Make certain not to have anything to eat or drink 6 hours prior to your appointment. Should you need to reschedule your appointment, please contact radiology at [phone]. This test typically takes about 30 minutes to perform.  Due to recent changes in healthcare laws, you may see the results of your imaging and laboratory studies on MyChart before your provider has had a chance to review them.  We understand that in some cases there may be results that are confusing or concerning to you. Not all laboratory results come back in the same time frame and the provider may be waiting for multiple results in order to interpret others.  Please give us  48 hours in order for your provider to thoroughly review all the results before contacting the office for clarification of your results.   Thank you for entrusting me with your care and choosing [facility].  Dr Charlanne

## 2023-10-27 NOTE — Progress Notes (Signed)
 Chief Complaint: FU  Referring Provider:  Allwardt, Mardy HERO, PA-C      ASSESSMENT AND PLAN;   #1. H/O acute pancreatitis (resolved).  Could be multiple etiologies including GLP-1, previous history of divisum. No FH, Nl TG. Pt would like to continue GLP-1.  #2. 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 EUS did not show any significant PD dilatation.  #3. Antiphosphlipid syndrome on coumadin   Plan: -Can continue GLP-1 (zepbound). Recommend to continue current low dose for next 3 months. Continued close follow-up with endocrine @ [facility]. -CBC, CMP, lipase, IgG-4, HBA1c. -RUQ US  to r/o cholelithiasis. If+, consider lap chole. -Strict ETOH abstinence. -If recurrent bouts of acute pancreatitis, then would recommend stopping GLP-1, consider MRCP before and after secretin. If +, can consider ERCP with further minor papilla sphincterotomy/stent. -RTC 6 months.   HPI:    History of Present Illness Kristen Hoover is a 38 year old female with a history of pancreatitis who presents for follow-up from recent hospitalization for acute pancreatitis.  She underwent MRCP, EUS which was negative for any etiology. Minimal alcohol use which she has stopped  Much better now.  Would like to continue Zepbound and consultation with endocrinology at [facility].  She restarted Zepbound in early September but has not increased her dose yet. She maintains her weight around 200 pounds, having lost some weight after a hospital stay for pancreatitis, reaching 195 pounds during the episode. She has a history of a minor papilla cut 20 years ago to aid drainage, performed in [place] .  She is on Coumadin  for antiphospholipid syndrome and self-monitors her INR, which was 3.1 this morning. She has not had recent blood tests since her procedure but is open to having lipase, CBC, CMP, and IgG4 tests done. She is on a statin for cholesterol management and reports triglycerides were 81  two months ago. She abstains from alcohol since her pancreatitis episode and avoids fatty foods to prevent aggravating her pancreas.  She has a family history of heart disease and diabetes, with her father, brother, and grandparents affected by diabetes. She is not diabetic but has PCOS and takes metformin . Her last hemoglobin A1c was 5.8 a year ago. She experiences some nausea, vomiting, and mostly constipation. There is no family history of pancreatitis or pancreatic cancer, though her paternal grandfather died of cancer, possibly colon or prostate.  She was hospitalized in August for pancreatitis and was off Zepbound for the entire month, restarting it on September 1st. She was off again for two weeks around a September procedure. She is currently on a 2.5 mg dose weekly and has two doses left before considering an increase to 5 mg.  Past GI workup  MRCP 08/09/2023 1. Findings of acute interstitial pancreatitis with diffuse inflammation/edema throughout the pancreas and in the surrounding peripancreatic soft tissues. No discrete fluid collection or hematoma. 2. Normal caliber and course of the common bile duct. No common bile duct stones are identified. 3. Pancreatic divisum again noted. I do not see any definite MR findings for a pancreatic duct stent. 4. Very small pleural effusions and bibasilar atelectasis.   EUS 10/06/2023 EUS impression: - Pancreas divisum was visualized. - Main pancreatic duct ( MPD) diameter was measured. Endosonographically, the MPD had a prominent appearance in the area of the pancreas divisum entering into the minor papilla, but otherwise did not appear to have ductal dilation. - A cystic lesion was seen in the genu of the pancreas. Tissue has not been  obtained. 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 - Pancreatic parenchymal abnormalities consisting of hyperechoic strands were noted in the entire  pancreas. - There was no sign of significant pathology at the ampulla. - There was no sign of significant pathology in the common bile duct and in the common hepatic duct. - Minimal amount of hyperechoic material consistent with sludge was visualized endosonographically in the gallbladder. - No malignant- appearing lymph nodes were visualized in the celiac region ( level 20) , peripancreatic region and porta hepatis region.  EGD EGD impression: - No gross lesions in the entire esophagus. Z- line irregular, 35 cm from the incisors. - 1 cm hiatal hernia. - Erythematous mucosa in the antrum. No other gross lesions in the entire stomach. Biopsied. - Patent though congested minor papilla sphincterotomy noted. - Normal major papilla. - No other gross lesions in the duodenal bulb, in the first portion of the duodenum and in the second portion of the duodenum.  Colon: 11/2022: - Small internal hemorrhoids - Otherwise normal colonoscopy to TI - Repeat in 10 years.  Earlier, if with any new problems    Wt Readings from Last 3 Encounters:  10/27/23 201 lb 6 oz (91.3 kg)  10/06/23 205 lb (93 kg)  09/15/23 203 lb 9.6 oz (92.4 kg)    Past Medical History:  Diagnosis Date   Anxiety and depression    Clotting disorder    Frequency of urination    GERD (gastroesophageal reflux disease)    Heart murmur    History of blood clots    History of pancreatitis    12-28-2018-  last episode 2006   Hypertension    PCOS (polycystic ovarian syndrome)    Renal calculus, right    Stroke [facility])    Urgency of urination    Wears glasses     Past Surgical History:  Procedure Laterality Date   BUBBLE STUDY  03/22/2022   Procedure: BUBBLE STUDY;  Surgeon: Pietro Redell RAMAN, MD;  Location: [facility] ENDOSCOPY;  Service: Cardiovascular;;   CYSTOSCOPY WITH RETROGRADE PYELOGRAM, URETEROSCOPY AND STENT PLACEMENT Right 01/05/2019   Procedure: CYSTOSCOPY/URETEROSCOP/STENT PLACEMENT;  Surgeon: Elisabeth Valli BIRCH, MD;  Location:  [facility];  Service: Urology;  Laterality: Right;  75 MINS   CYSTOSCOPY WITH RETROGRADE PYELOGRAM, URETEROSCOPY AND STENT PLACEMENT Right 02/09/2019   Procedure: CYSTOSCOPY WITH RETROGRADE PYELOGRAM, URETEROSCOPY AND STENT PLACEMENT;  Surgeon: Elisabeth Valli BIRCH, MD;  Location: [facility];  Service: Urology;  Laterality: Right;  1 HR   ESOPHAGOGASTRODUODENOSCOPY  2015   ESOPHAGOGASTRODUODENOSCOPY N/A 10/06/2023   Procedure: EGD (ESOPHAGOGASTRODUODENOSCOPY);  Surgeon: Wilhelmenia Aloha Raddle., MD;  Location: [facility];  Service: Gastroenterology;  Laterality: N/A;   EUS N/A 10/06/2023   Procedure: ULTRASOUND, UPPER GI TRACT, ENDOSCOPIC;  Surgeon: Wilhelmenia Aloha Raddle., MD;  Location: [facility] ENDOSCOPY;  Service: Gastroenterology;  Laterality: N/A;   HOLMIUM LASER APPLICATION Right 02/09/2019   Procedure: HOLMIUM LASER APPLICATION;  Surgeon: Elisabeth Valli BIRCH, MD;  Location: [facility];  Service: Urology;  Laterality: Right;   TEE WITHOUT CARDIOVERSION N/A 03/22/2022   Procedure: TRANSESOPHAGEAL ECHOCARDIOGRAM (TEE);  Surgeon: Pietro Redell RAMAN, MD;  Location: [facility] ENDOSCOPY;  Service: Cardiovascular;  Laterality: N/A;   TONSILLECTOMY  2014   WISDOM TOOTH EXTRACTION  2018    Family History  Problem Relation Age of Onset   Fibromyalgia Mother    Headache Mother    Neuropathy Father    Diabetes Father  Heart failure Father    Diabetes Brother    Breast cancer Maternal Grandmother    Colon cancer Paternal Grandfather    Liver disease Neg Hx    Esophageal cancer Neg Hx    Colon polyps Neg Hx    Rectal cancer Neg Hx    Stomach cancer Neg Hx     Social History   Tobacco Use   Smoking status: Never   Smokeless tobacco: Never  Vaping Use   Vaping status: Never Used  Substance Use Topics   Alcohol use: Yes    Comment: Rare   Drug use: Not Currently    Types: Marijuana    Comment: 12-28-2018  per pt last used 5 wks ago    Current Outpatient  Medications  Medication Sig Dispense Refill   ALPRAZolam  (XANAX ) 0.5 MG tablet Take 1/2 to 1 tab po BID prn for acute panic. (Patient taking differently: Take 0.5 mg by mouth. Take 1/2 to 1 tab po BID prn for acute panic.) 30 tablet 2   aspirin  EC 81 MG tablet Take 81 mg by mouth daily.     atorvastatin  (LIPITOR) 40 MG tablet Take 1 tablet (40 mg total) by mouth daily. 90 tablet 3   busPIRone  (BUSPAR ) 15 MG tablet Take 15 mg by mouth 2 (two) times daily.     Calcium  Carbonate Antacid (TUMS PO) Take 2 tablets by mouth 2 (two) times daily as needed (stomach pain).     Cholecalciferol (VITAMIN D -3 PO) Take 1 capsule by mouth daily.     ferrous sulfate 325 (65 FE) MG EC tablet Take 325 mg by mouth daily.     folic acid  (FOLVITE ) 1 MG tablet Take 1 mg by mouth daily.     levocetirizine (XYZAL ) 5 MG tablet Take 1 tablet (5 mg total) by mouth in the morning and at bedtime. 180 tablet 1   metFORMIN  (GLUCOPHAGE -XR) 750 MG 24 hr tablet TAKE 1 TABLET BY MOUTH 2 TIMES DAILY. 180 tablet 1   Omega-3 Fatty Acids (FISH OIL PO) Take 1 capsule by mouth daily.     ondansetron  (ZOFRAN -ODT) 4 MG disintegrating tablet Take 1 tablet (4 mg total) by mouth every 8 (eight) hours as needed for nausea or vomiting. 20 tablet 0   spironolactone  (ALDACTONE ) 50 MG tablet TAKE 1 TABLET BY MOUTH EVERY DAY 90 tablet 3   TRINTELLIX  5 MG TABS tablet Take 5 mg by mouth daily.     warfarin (COUMADIN ) 5 MG tablet TAKE 3 TABLETS BY MOUTH DAILY EXCEPT 3 1/2 TABLETS ON WEDNESDAYS OR AS DIRECTED BY ANTICOAGULATION CLINIC  Take 5 mg warfarin (1 tab) tonight, resume your regular dosing tomorrow     ZEPBOUND 2.5 MG/0.5ML Pen Inject 2.5 mg into the skin once a week.     No current facility-administered medications for this visit.    Allergies  Allergen Reactions   Morphine  Other (See Comments)    SWOLLEN, RED, PAINFUL FEET AND LOWER LEGS, SEVERE     Review of Systems:  Constitutional: Denies fever, chills, diaphoresis, appetite  change and fatigue.  HEENT: Denies photophobia, eye pain, redness, hearing loss, ear pain, congestion, sore throat, rhinorrhea, sneezing, mouth sores, neck pain, neck stiffness and tinnitus.   Respiratory: Denies SOB, DOE, cough, chest tightness,  and wheezing.   Cardiovascular: Denies chest pain, palpitations and leg swelling.  Genitourinary: Denies dysuria, urgency, frequency, hematuria, flank pain and difficulty urinating.  Musculoskeletal: Denies myalgias, back pain, joint swelling, arthralgias and gait problem.  Skin: No  rash.  Neurological: Denies dizziness, seizures, syncope, weakness, light-headedness, numbness and headaches.  Hematological: Denies adenopathy. Easy bruising, personal or family bleeding history  Psychiatric/Behavioral: No anxiety or depression     Physical Exam:    BP 112/70   Pulse 92   Ht 5' 6 (1.676 m)   Wt 201 lb 6 oz (91.3 kg)   LMP 09/15/2023 (Approximate)   BMI 32.50 kg/m  Wt Readings from Last 3 Encounters:  10/27/23 201 lb 6 oz (91.3 kg)  10/06/23 205 lb (93 kg)  09/15/23 203 lb 9.6 oz (92.4 kg)   Constitutional:  Well-developed, in no acute distress. Psychiatric: Normal mood and affect. Behavior is normal. HEENT: Pupils normal.  Conjunctivae are normal. No scleral icterus. Cardiovascular: Normal rate, regular rhythm. No edema Pulmonary/chest: Effort normal and breath sounds normal. No wheezing, rales or rhonchi. Abdominal: Soft, nondistended. Nontender. Bowel sounds active throughout. There are no masses palpable. No hepatomegaly. Rectal: Deferred Neurological: Alert and oriented to person place and time. Skin: Skin is warm and dry. No rashes noted.  Data Reviewed: I have personally reviewed following labs and imaging studies  CBC:    Latest Ref Rng & Units 09/15/2023    9:32 AM 08/13/2023   11:47 AM 08/11/2023    5:57 AM  CBC  WBC 4.0 - 10.5 K/uL 9.5  7.6  8.0   Hemoglobin 12.0 - 15.0 g/dL 87.0  87.5  89.3   Hematocrit 36.0 - 46.0 % 38.7   36.1  30.7   Platelets 150.0 - 400.0 K/uL 230.0  258.0  177     CMP:    Latest Ref Rng & Units 09/15/2023    9:32 AM 08/11/2023    5:57 AM 08/10/2023    4:15 AM  CMP  Glucose 70 - 99 mg/dL 85  86  96   BUN 6 - 23 mg/dL 12  <5  8   Creatinine 0.40 - 1.20 mg/dL 9.21  9.26  9.19   Sodium 135 - 145 mEq/L 138  138  138   Potassium 3.5 - 5.1 mEq/L 3.7  3.6  3.9   Chloride 96 - 112 mEq/L 104  109  108   CO2 19 - 32 mEq/L 27  23  23    Calcium  8.4 - 10.5 mg/dL 9.0  8.4  8.0   Total Protein 6.0 - 8.3 g/dL 7.2   5.9   Total Bilirubin 0.2 - 1.2 mg/dL 0.4   1.1   Alkaline Phos 39 - 117 U/L 86   61   AST 0 - 37 U/L 16   14   ALT 0 - 35 U/L 24   15     Lipase     Component Value Date/Time   LIPASE 103.0 (H) 08/13/2023 1147      Anselm Bring, MD 10/27/2023, 8:50 AM  Cc: Allwardt, Mardy HERO, PA-C

## 2023-10-28 LAB — IGG 4: IgG, Subclass 4: 38 mg/dL (ref 2–96)

## 2023-10-29 ENCOUNTER — Ambulatory Visit: Payer: Self-pay | Admitting: Gastroenterology

## 2023-11-04 ENCOUNTER — Ambulatory Visit (HOSPITAL_COMMUNITY)
Admission: RE | Admit: 2023-11-04 | Discharge: 2023-11-04 | Disposition: A | Discharge status: Home / Self Care | Source: Ambulatory Visit | Attending: Gastroenterology | Admitting: Gastroenterology

## 2023-11-04 DIAGNOSIS — K859 Acute pancreatitis without necrosis or infection, unspecified: Secondary | ICD-10-CM | POA: Diagnosis present

## 2023-11-04 DIAGNOSIS — Q453 Other congenital malformations of pancreas and pancreatic duct: Secondary | ICD-10-CM | POA: Insufficient documentation

## 2023-11-10 ENCOUNTER — Ambulatory Visit

## 2023-11-10 DIAGNOSIS — Z7901 Long term (current) use of anticoagulants: Secondary | ICD-10-CM

## 2023-11-10 LAB — POCT INR: INR: 2.1 (ref 2.0–3.0)

## 2023-11-10 NOTE — Progress Notes (Signed)
 Indication: stroke, APS Continue 2 tablets daily except take 3 tablets on Tuesday,  Thursday and Saturday. Recheck in 2 weeks, on 11/17. Advised pt of dosing and recheck date. Pt verbalized understanding.

## 2023-11-10 NOTE — Patient Instructions (Addendum)
 Pre visit review using our clinic review tool, if applicable. No additional management support is needed unless otherwise documented below in the visit note.  Continue 2 tablets daily except take 3 tablets on Tuesday,  Thursday and Saturday. Recheck in 2 weeks, on 11/17.

## 2023-11-16 ENCOUNTER — Other Ambulatory Visit: Payer: Self-pay | Admitting: Physician Assistant

## 2023-11-17 DIAGNOSIS — L658 Other specified nonscarring hair loss: Secondary | ICD-10-CM | POA: Insufficient documentation

## 2023-11-20 ENCOUNTER — Other Ambulatory Visit: Payer: Self-pay | Admitting: Physician Assistant

## 2023-11-25 ENCOUNTER — Ambulatory Visit

## 2023-11-25 DIAGNOSIS — Z7901 Long term (current) use of anticoagulants: Secondary | ICD-10-CM

## 2023-11-25 LAB — POCT INR: INR: 1.8 — AB (ref 2.0–3.0)

## 2023-11-25 NOTE — Patient Instructions (Addendum)
 Pre visit review using our clinic review tool, if applicable. No additional management support is needed unless otherwise documented below in the visit note.  Increase dose today to take 4 1/2 tablets and then continue 2 tablets daily except take 3 tablets on Tuesday,  Thursday and Saturday. Recheck in 2 weeks, on 12/2.

## 2023-11-25 NOTE — Progress Notes (Signed)
 Indication: stroke, APS Increase dose today to take 4 1/2 tablets and then continue 2 tablets daily except take 3 tablets on Tuesday,  Thursday and Saturday. Recheck in 2 weeks, on 12/2. LVM with dosing and recheck date.

## 2023-12-09 ENCOUNTER — Ambulatory Visit (INDEPENDENT_AMBULATORY_CARE_PROVIDER_SITE_OTHER): Payer: Self-pay

## 2023-12-09 DIAGNOSIS — Z7901 Long term (current) use of anticoagulants: Secondary | ICD-10-CM | POA: Diagnosis not present

## 2023-12-09 LAB — POCT INR: INR: 2.2 (ref 2.0–3.0)

## 2023-12-09 NOTE — Patient Instructions (Signed)
Pre visit review using our clinic review tool, if applicable. No additional management support is needed unless otherwise documented below in the visit note. 

## 2023-12-09 NOTE — Progress Notes (Signed)
 Indication: stroke, APS Pt reports she had diarrhea about 4 times in the last two weeks. She is doing well now and thinks it could be from an ice cream she was eating. Continue 2 tablets daily except take 3 tablets on Tuesday,  Thursday and Saturday. Recheck in 2 weeks, on 12/15. Advised of dosing and recheck date. Pt verbalized understanding.

## 2023-12-14 ENCOUNTER — Other Ambulatory Visit: Payer: Self-pay | Admitting: Physician Assistant

## 2023-12-23 ENCOUNTER — Ambulatory Visit

## 2023-12-23 DIAGNOSIS — Z7901 Long term (current) use of anticoagulants: Secondary | ICD-10-CM

## 2023-12-23 LAB — POCT INR: INR: 2.2 (ref 2.0–3.0)

## 2023-12-23 NOTE — Patient Instructions (Addendum)
 Pre visit review using our clinic review tool, if applicable. No additional management support is needed unless otherwise documented below in the visit note.  Continue 2 tablets daily except take 3 tablets on Tuesday, Thursday and Saturday. Recheck in 2 weeks, on 12/29.

## 2023-12-23 NOTE — Progress Notes (Signed)
 Indication: stroke, APS Continue 2 tablets daily except take 3 tablets on Tuesday, Thursday and Saturday. Recheck in 2 weeks, on 12/29. LVM with instructions and recheck date.

## 2023-12-30 ENCOUNTER — Ambulatory Visit: Admitting: Physician Assistant

## 2023-12-30 ENCOUNTER — Encounter: Payer: Self-pay | Admitting: Physician Assistant

## 2023-12-30 VITALS — BP 108/76 | HR 82 | Temp 97.4°F | Ht 66.0 in | Wt 202.6 lb

## 2023-12-30 DIAGNOSIS — N6310 Unspecified lump in the right breast, unspecified quadrant: Secondary | ICD-10-CM | POA: Diagnosis not present

## 2023-12-30 DIAGNOSIS — R110 Nausea: Secondary | ICD-10-CM

## 2023-12-30 DIAGNOSIS — N644 Mastodynia: Secondary | ICD-10-CM | POA: Diagnosis not present

## 2023-12-30 MED ORDER — ONDANSETRON 4 MG PO TBDP: 4.0000 mg | ORAL_TABLET | Freq: Three times a day (TID) | ORAL | 2 refills | Status: DC | PRN

## 2023-12-30 NOTE — Progress Notes (Signed)
 "   Patient ID: Kristen Hoover, female    DOB: August 28, 1985, 38 y.o.   MRN: [id number]   Assessment & Plan:  Painful lumpy right breast -     MM Digital Diagnostic Bilat; Future  Nausea -     Ondansetron ; Take 1 tablet (4 mg total) by mouth every 8 (eight) hours as needed for nausea or vomiting.  Dispense: 45 tablet; Refill: 2    Assessment & Plan Painful lumpy right breast Painful lump in the right breast, first noticed four days ago. No visible discoloration, nipple discharge, or rash. No history of breast cysts or infections. Previous mammogram last year was normal. Family history of breast cancer in maternal grandmother, diagnosed in her seventies. Physical examination revealed no discrete mass, but extra density in the right upper outer quadrant. Differential diagnosis includes hormonal changes due to perimenopause. - Ordered diagnostic mammogram with ultrasound for further evaluation. - Advised tracking menstrual cycle to correlate with breast changes.  Nausea Currently on a lower dose of Zepbound. Experiences nausea on days with poor dietary intake. Plans to increase Zepbound dose in February, six months post-pancreatitis. - Prescribed Zofran  with 45 tablets and refills to manage nausea. - Coordinated with endocrinologist for potential increase in Zepbound dosage in February.    F/up prn    Subjective:    Chief Complaint  Patient presents with   Breast Mass    Pt has noticed a lump in right breast and wanting to be seen and would like to have checked. Pt noticed first last Friday and painful and tender to touch;     HPI Discussed the use of AI scribe software for clinical note transcription with the patient, who gave verbal consent to proceed.  History of Present Illness Kristen Hoover is a 38 year old female who presents with a painful lump in her right breast.  She first noticed the painful and tender lump in her right breast on December 26, 2023,  approximately four days ago. There is no visible change, discoloration, or rash on the breast, and no nipple discharge or inversion. She has a regular menstrual cycle, with her last period occurring in the first week of December. She is not on birth control and has not experienced any pregnancy symptoms. She does not recall any trauma to the area that could have caused the lump.  Her past medical history includes a normal mammogram performed last year due to right axilla pain. She has no history of breast cysts or infections. Her maternal grandmother had breast cancer diagnosed in her seventies, but she is still living. She has not increased her caffeine intake recently, which can sometimes aggravate breast tissue.  She is currently on Zepbound, managed by her endocrinologist at [facility], and takes Zofran  as needed, with a request for an increased prescription due to frequent use.     Past Medical History:  Diagnosis Date   Anxiety and depression    Clotting disorder    Frequency of urination    GERD (gastroesophageal reflux disease)    Heart murmur    History of blood clots    History of pancreatitis    12-28-2018-  last episode 2006   Hypertension    PCOS (polycystic ovarian syndrome)    Renal calculus, right    Stroke [facility])    Urgency of urination    Wears glasses     Past Surgical History:  Procedure Laterality Date   BUBBLE STUDY  03/22/2022   Procedure: BUBBLE  STUDY;  Surgeon: Pietro Redell RAMAN, MD;  Location: [facility] ENDOSCOPY;  Service: Cardiovascular;;   CYSTOSCOPY WITH RETROGRADE PYELOGRAM, URETEROSCOPY AND STENT PLACEMENT Right 01/05/2019   Procedure: CYSTOSCOPY/URETEROSCOP/STENT PLACEMENT;  Surgeon: Elisabeth Valli BIRCH, MD;  Location: [facility];  Service: Urology;  Laterality: Right;  75 MINS   CYSTOSCOPY WITH RETROGRADE PYELOGRAM, URETEROSCOPY AND STENT PLACEMENT Right 02/09/2019   Procedure: CYSTOSCOPY WITH RETROGRADE PYELOGRAM, URETEROSCOPY AND STENT PLACEMENT;   Surgeon: Elisabeth Valli BIRCH, MD;  Location: [facility];  Service: Urology;  Laterality: Right;  1 HR   ESOPHAGOGASTRODUODENOSCOPY  2015   ESOPHAGOGASTRODUODENOSCOPY N/A 10/06/2023   Procedure: EGD (ESOPHAGOGASTRODUODENOSCOPY);  Surgeon: Wilhelmenia Aloha Raddle., MD;  Location: [facility];  Service: Gastroenterology;  Laterality: N/A;   EUS N/A 10/06/2023   Procedure: ULTRASOUND, UPPER GI TRACT, ENDOSCOPIC;  Surgeon: Wilhelmenia Aloha Raddle., MD;  Location: [facility] ENDOSCOPY;  Service: Gastroenterology;  Laterality: N/A;   HOLMIUM LASER APPLICATION Right 02/09/2019   Procedure: HOLMIUM LASER APPLICATION;  Surgeon: Elisabeth Valli BIRCH, MD;  Location: [facility];  Service: Urology;  Laterality: Right;   TEE WITHOUT CARDIOVERSION N/A 03/22/2022   Procedure: TRANSESOPHAGEAL ECHOCARDIOGRAM (TEE);  Surgeon: Pietro Redell RAMAN, MD;  Location: [facility] ENDOSCOPY;  Service: Cardiovascular;  Laterality: N/A;   TONSILLECTOMY  2014   WISDOM TOOTH EXTRACTION  2018    Family History  Problem Relation Age of Onset   Fibromyalgia Mother    Headache Mother    Neuropathy Father    Diabetes Father    Heart failure Father    Diabetes Brother    Breast cancer Maternal Grandmother    Colon cancer Paternal Grandfather    Liver disease Neg Hx    Esophageal cancer Neg Hx    Colon polyps Neg Hx    Rectal cancer Neg Hx    Stomach cancer Neg Hx     Social History[1]   Allergies[2]  Review of Systems NEGATIVE UNLESS OTHERWISE INDICATED IN HPI      Objective:     BP 108/76 (BP Location: Left Arm, Patient Position: Sitting, Cuff Size: Normal)   Pulse 82   Temp (!) 97.4 F (36.3 C) (Temporal)   Ht 5' 6 (1.676 m)   Wt 202 lb 9.6 oz (91.9 kg)   LMP 12/10/2023 (Approximate)   SpO2 99%   BMI 32.70 kg/m   Wt Readings from Last 3 Encounters:  12/30/23 202 lb 9.6 oz (91.9 kg)  10/27/23 201 lb 6 oz (91.3 kg)  10/06/23 205 lb (93 kg)    BP Readings from Last 3 Encounters:  12/30/23  108/76  10/27/23 112/70  10/06/23 110/87     Physical Exam Vitals and nursing note reviewed.  Constitutional:      Appearance: Normal appearance.  Cardiovascular:     Rate and Rhythm: Normal rate.  Pulmonary:     Effort: Pulmonary effort is normal.  Chest:  Breasts:    Right: Tenderness (right upper outer quadrant diffuse tenderness and lumpiness noted, no discrete mass) present. No swelling, bleeding, inverted nipple, mass, nipple discharge or skin change.     Left: Normal. No mass, nipple discharge, skin change or tenderness.  Neurological:     Mental Status: She is alert.  Psychiatric:        Mood and Affect: Mood normal.             [place] M Cherrish Vitali, PA-C     [1]  Social History Tobacco Use   Smoking status: Never  Smokeless tobacco: Never  Vaping Use   Vaping status: Never Used  Substance Use Topics   Alcohol use: Yes    Comment: Rare   Drug use: Not Currently    Types: Marijuana    Comment: 12-28-2018  per pt last used 5 wks ago  [2]  Allergies Allergen Reactions   Morphine  Other (See Comments)    SWOLLEN, RED, PAINFUL FEET AND LOWER LEGS, SEVERE    "

## 2024-01-06 ENCOUNTER — Telehealth: Payer: Self-pay

## 2024-01-06 ENCOUNTER — Ambulatory Visit (INDEPENDENT_AMBULATORY_CARE_PROVIDER_SITE_OTHER)

## 2024-01-06 DIAGNOSIS — Z7901 Long term (current) use of anticoagulants: Secondary | ICD-10-CM

## 2024-01-06 LAB — POCT INR: INR: 1.7 — AB (ref 2.0–3.0)

## 2024-01-06 NOTE — Telephone Encounter (Signed)
 Pt tests INR at home. It is time for pt to test. LVM requesting pt test INR.

## 2024-01-06 NOTE — Progress Notes (Signed)
 Indication: stroke, APS Eating more greens in the past week.  Increase dose today to take 4 1/2 tablets and then continue 2 tablets daily except take 3 tablets on Tuesday, Thursday and Saturday. Recheck in 2 weeks, on 1/12. Contacted pt and advised of dosing and recheck date. Pt verbalized understanding.

## 2024-01-06 NOTE — Patient Instructions (Addendum)
 Pre visit review using our clinic review tool, if applicable. No additional management support is needed unless otherwise documented below in the visit note.  Increase dose today to take 4 1/2 tablets and then continue 2 tablets daily except take 3 tablets on Tuesday, Thursday and Saturday. Recheck in 2 weeks, on 1/12.

## 2024-01-09 ENCOUNTER — Other Ambulatory Visit: Payer: Self-pay | Admitting: Physician Assistant

## 2024-01-09 DIAGNOSIS — Z7901 Long term (current) use of anticoagulants: Secondary | ICD-10-CM

## 2024-01-12 ENCOUNTER — Encounter: Payer: Self-pay | Admitting: Physician Assistant

## 2024-01-12 ENCOUNTER — Other Ambulatory Visit: Payer: Self-pay

## 2024-01-12 DIAGNOSIS — N6310 Unspecified lump in the right breast, unspecified quadrant: Secondary | ICD-10-CM

## 2024-01-12 DIAGNOSIS — Z1231 Encounter for screening mammogram for malignant neoplasm of breast: Secondary | ICD-10-CM

## 2024-01-12 NOTE — Addendum Note (Signed)
 Addended by: CRISTOPHER MAUS C on: 01/12/2024 12:09 PM   Modules accepted: Orders

## 2024-01-12 NOTE — Telephone Encounter (Signed)
 Pt is compliant with warfarin management and PCP apts.  Sent in refill of warfarin to requested pharmacy.

## 2024-01-14 ENCOUNTER — Ambulatory Visit: Admitting: Physician Assistant

## 2024-01-14 NOTE — Addendum Note (Signed)
 Addended by: KEARNEY KOLEEN RAMAN on: 01/14/2024 01:29 PM   Modules accepted: Orders

## 2024-01-16 LAB — HM MAMMOGRAPHY

## 2024-01-19 ENCOUNTER — Encounter: Payer: Self-pay | Admitting: Physician Assistant

## 2024-01-20 ENCOUNTER — Ambulatory Visit (INDEPENDENT_AMBULATORY_CARE_PROVIDER_SITE_OTHER)

## 2024-01-20 DIAGNOSIS — Z7901 Long term (current) use of anticoagulants: Secondary | ICD-10-CM | POA: Diagnosis not present

## 2024-01-20 LAB — POCT INR: INR: 1.8 — AB (ref 2.0–3.0)

## 2024-01-20 NOTE — Patient Instructions (Addendum)
 Pre visit review using our clinic review tool, if applicable. No additional management support is needed unless otherwise documented below in the visit note.  Increase dose today to take 4 1/2 tablets and then change weekly dosing to take 3 tablets daily except take 2 tablets on Monday, Wednesday and Friday. Recheck in 1 weeks, on 1/20.

## 2024-01-20 NOTE — Progress Notes (Signed)
 Indication: stroke, APS Eating more greens lately due to mother moving in and doing most of the cooking. Increase dose today to take 4 1/2 tablets and then change weekly dosing to take 3 tablets daily except take 2 tablets on Monday, Wednesday and Friday. Recheck in 1 weeks, on 1/20. Contacted pt and advised of dosing and recheck date. Pt verbalized understanding.

## 2024-01-27 ENCOUNTER — Other Ambulatory Visit: Payer: Self-pay | Admitting: Physician Assistant

## 2024-01-27 DIAGNOSIS — R110 Nausea: Secondary | ICD-10-CM

## 2024-01-28 ENCOUNTER — Ambulatory Visit (INDEPENDENT_AMBULATORY_CARE_PROVIDER_SITE_OTHER)

## 2024-01-28 DIAGNOSIS — Z7901 Long term (current) use of anticoagulants: Secondary | ICD-10-CM

## 2024-01-28 LAB — POCT INR: INR: 1.8 — AB (ref 2.0–3.0)

## 2024-01-28 NOTE — Patient Instructions (Addendum)
 Pre visit review using our clinic review tool, if applicable. No additional management support is needed unless otherwise documented below in the visit note.  Increase dose today to take 3 tablets and then change weekly dosing to take 3 tablets daily except take 2 tablets on Monday. Recheck in 1 weeks, on 1/28.

## 2024-01-28 NOTE — Progress Notes (Signed)
 Indication: stroke, APS Eating healthier due to mother moving in and doing most of the cooking. Pt reports also that she has recently reached over a 30 lb weight loss with using GLP1. It has been observed in coumadin  clinic pts that once a pt reaches 30-40 lb wt loss they are requiring a higher dose of warfarin.  Made a larger change in warfarin dosing due to these factors. Pt will recheck in 1 week. Increase dose today to take 3 tablets and then change weekly dosing to take 3 tablets daily except take 2 tablets on Monday. Recheck in 1 weeks, on 1/28. Contacted pt and advised of dosing and recheck date. Pt verbalized understanding.

## 2024-01-29 ENCOUNTER — Encounter: Payer: Self-pay | Admitting: Gastroenterology

## 2024-01-29 NOTE — Telephone Encounter (Signed)
 No issues from my standpoint. Will watch for change in GI symptoms. RG

## 2024-01-30 ENCOUNTER — Other Ambulatory Visit: Payer: Self-pay | Admitting: Physician Assistant

## 2024-02-03 ENCOUNTER — Encounter: Payer: Self-pay | Admitting: Physician Assistant

## 2024-02-04 LAB — POCT INR: INR: 3.1 — AB (ref 2.0–3.0)

## 2024-02-05 ENCOUNTER — Ambulatory Visit (INDEPENDENT_AMBULATORY_CARE_PROVIDER_SITE_OTHER)

## 2024-02-05 DIAGNOSIS — Z7901 Long term (current) use of anticoagulants: Secondary | ICD-10-CM

## 2024-02-05 NOTE — Patient Instructions (Addendum)
 Pre visit review using our clinic review tool, if applicable. No additional management support is needed unless otherwise documented below in the visit note.  Reduce dose today to take 2 tablets and then continue 3 tablets daily except take 2 tablets on Monday. Recheck in 2 weeks, on 2/11.

## 2024-02-05 NOTE — Progress Notes (Signed)
 Indication: stroke, APS Zepbound dose has been increased. This can decrease INR due to slowing gastric motility.  Reduce dose today to take 2 tablets and then continue 3 tablets daily except take 2 tablets on Monday. Recheck in 2 weeks, on 2/11. LVM with dosing instructions and recheck date.

## 2024-06-11 ENCOUNTER — Ambulatory Visit: Admitting: Neurology

## 2024-06-29 ENCOUNTER — Ambulatory Visit: Admitting: Physician Assistant

## 2024-06-29 ENCOUNTER — Other Ambulatory Visit
